# Patient Record
Sex: Male | Born: 1955
Health system: Southern US, Community
[De-identification: ages and names within clinical notes are randomized; demographics above are authoritative.]

## PROBLEM LIST (undated history)

## (undated) DIAGNOSIS — E079 Disorder of thyroid, unspecified: Secondary | ICD-10-CM

## (undated) DIAGNOSIS — I10 Essential (primary) hypertension: Secondary | ICD-10-CM

## (undated) HISTORY — DX: Essential (primary) hypertension: I10

## (undated) HISTORY — PX: HERNIA REPAIR: SHX51

## (undated) HISTORY — DX: Disorder of thyroid, unspecified: E07.9

---

## 2010-11-09 ENCOUNTER — Emergency Department (HOSPITAL_COMMUNITY)
Admission: EM | Admit: 2010-11-09 | Discharge: 2010-11-09 | Disposition: A | Discharge status: Home / Self Care | Payer: Federal, State, Local not specified - PPO | Attending: Emergency Medicine | Admitting: Emergency Medicine

## 2010-11-09 ENCOUNTER — Emergency Department (HOSPITAL_COMMUNITY): Payer: Federal, State, Local not specified - PPO

## 2010-11-09 DIAGNOSIS — Y9289 Other specified places as the place of occurrence of the external cause: Secondary | ICD-10-CM | POA: Insufficient documentation

## 2010-11-09 DIAGNOSIS — I4510 Unspecified right bundle-branch block: Secondary | ICD-10-CM | POA: Insufficient documentation

## 2010-11-09 DIAGNOSIS — R072 Precordial pain: Secondary | ICD-10-CM | POA: Insufficient documentation

## 2010-11-09 DIAGNOSIS — I498 Other specified cardiac arrhythmias: Secondary | ICD-10-CM | POA: Insufficient documentation

## 2010-11-09 DIAGNOSIS — Y990 Civilian activity done for income or pay: Secondary | ICD-10-CM | POA: Insufficient documentation

## 2010-11-09 LAB — COMPREHENSIVE METABOLIC PANEL
ALT: 18 U/L (ref 0–53)
AST: 18 U/L (ref 0–37)
Albumin: 4 g/dL (ref 3.5–5.2)
Alkaline Phosphatase: 82 U/L (ref 39–117)
CO2: 22 mEq/L (ref 19–32)
Chloride: 106 mEq/L (ref 96–112)
Creatinine, Ser: 0.83 mg/dL (ref 0.50–1.35)
Potassium: 3.9 mEq/L (ref 3.5–5.1)
Sodium: 137 mEq/L (ref 135–145)
Total Bilirubin: 0.2 mg/dL — ABNORMAL LOW (ref 0.3–1.2)

## 2010-11-09 LAB — DIFFERENTIAL
Lymphocytes Relative: 16 % (ref 12–46)
Lymphs Abs: 1.9 10*3/uL (ref 0.7–4.0)
Monocytes Relative: 7 % (ref 3–12)
Neutrophils Relative %: 74 % (ref 43–77)

## 2010-11-09 LAB — URINALYSIS, ROUTINE W REFLEX MICROSCOPIC
Bilirubin Urine: NEGATIVE
Glucose, UA: NEGATIVE mg/dL
Hgb urine dipstick: NEGATIVE
Protein, ur: NEGATIVE mg/dL

## 2010-11-09 LAB — CBC
HCT: 44.6 % (ref 39.0–52.0)
Hemoglobin: 16.1 g/dL (ref 13.0–17.0)
MCH: 32.4 pg (ref 26.0–34.0)
MCV: 89.7 fL (ref 78.0–100.0)
RBC: 4.97 MIL/uL (ref 4.22–5.81)
WBC: 11.7 10*3/uL — ABNORMAL HIGH (ref 4.0–10.5)

## 2010-11-09 LAB — TROPONIN I: Troponin I: <0.3 ng/mL (ref <0.30)

## 2010-11-09 LAB — CK TOTAL AND CKMB (NOT AT ARMC): Relative Index: 3 — ABNORMAL HIGH (ref 0.0–2.5)

## 2011-06-14 ENCOUNTER — Ambulatory Visit (INDEPENDENT_AMBULATORY_CARE_PROVIDER_SITE_OTHER): Payer: Federal, State, Local not specified - PPO | Admitting: Internal Medicine

## 2011-06-14 VITALS — BP 137/95 | HR 58 | Temp 98.1°F | Resp 18 | Ht 71.0 in | Wt 241.0 lb

## 2011-06-14 DIAGNOSIS — R112 Nausea with vomiting, unspecified: Secondary | ICD-10-CM

## 2011-06-14 DIAGNOSIS — R197 Diarrhea, unspecified: Secondary | ICD-10-CM

## 2011-06-14 LAB — POCT UA - MICROSCOPIC ONLY
Crystals, Ur, HPF, POC: NEGATIVE
Epithelial cells, urine per micros: NEGATIVE
Mucus, UA: NEGATIVE
RBC, urine, microscopic: NEGATIVE
Yeast, UA: NEGATIVE

## 2011-06-14 LAB — POCT URINALYSIS DIPSTICK
Ketones, UA: 15
pH, UA: 5.5

## 2011-06-14 LAB — POCT CBC
Granulocyte percent: 63.7 %G (ref 37–80)
MID (cbc): 0.8 (ref 0–0.9)
MPV: 9.5 fL (ref 0–99.8)
POC Granulocyte: 5.8 (ref 2–6.9)
POC LYMPH PERCENT: 27 %L (ref 10–50)
Platelet Count, POC: 260 10*3/uL (ref 142–424)
RDW, POC: 13.7 %

## 2011-06-14 LAB — COMPREHENSIVE METABOLIC PANEL
ALT: 41 U/L (ref 0–53)
AST: 41 U/L — ABNORMAL HIGH (ref 0–37)
Alkaline Phosphatase: 69 U/L (ref 39–117)
Sodium: 136 mEq/L (ref 135–145)
Total Bilirubin: 0.7 mg/dL (ref 0.3–1.2)
Total Protein: 7.6 g/dL (ref 6.0–8.3)

## 2011-06-14 LAB — GLUCOSE, POCT (MANUAL RESULT ENTRY): POC Glucose: 87

## 2011-06-14 MED ORDER — ONDANSETRON HCL 4 MG PO TABS: 4.0000 mg | ORAL_TABLET | Freq: Three times a day (TID) | ORAL | Status: AC | PRN

## 2011-06-14 NOTE — Patient Instructions (Signed)
Nausea and Diarrhea.  Take your Zofran and Imodium for 24-48 hours.  Drink clear liquids today then advance to Chicken soup tonight for supper.  Avoid eating out as additives might upset your bowels more.  Tomorrow try bananas, rice, toast, saltines.  Add more foods to your diet as your symptoms subside. Diarrhea Infections caused by germs (bacterial) or a virus commonly cause diarrhea. Your caregiver has determined that with time, rest and fluids, the diarrhea should improve. In general, eat normally while drinking more water than usual. Although water may prevent dehydration, it does not contain salt and minerals (electrolytes). Broths, weak tea without caffeine and oral rehydration solutions (ORS) replace fluids and electrolytes. Small amounts of fluids should be taken frequently. Large amounts at one time may not be tolerated. Plain water may be harmful in infants and the elderly. Oral rehydrating solutions (ORS) are available at pharmacies and grocery stores. ORS replace water and important electrolytes in proper proportions. Sports drinks are not as effective as ORS and may be harmful due to sugars worsening diarrhea.  ORS is especially recommended for use in children with diarrhea. As a general guideline for children, replace any new fluid losses from diarrhea and/or vomiting with ORS as follows:   If your child weighs 22 pounds or under (10 kg or less), give 60-120 mL ( -  cup or 2 - 4 ounces) of ORS for each episode of diarrheal stool or vomiting episode.   If your child weighs more than 22 pounds (more than 10 kgs), give 120-240 mL ( - 1 cup or 4 - 8 ounces) of ORS for each diarrheal stool or episode of vomiting.   While correcting for dehydration, children should eat normally. However, foods high in sugar should be avoided because this may worsen diarrhea. Large amounts of carbonated soft drinks, juice, gelatin desserts and other highly sugared drinks should be avoided.   After correction  of dehydration, other liquids that are appealing to the child may be added. Children should drink small amounts of fluids frequently and fluids should be increased as tolerated. Children should drink enough fluids to keep urine clear or pale yellow.   Adults should eat normally while drinking more fluids than usual. Drink small amounts of fluids frequently and increase as tolerated. Drink enough fluids to keep urine clear or pale yellow. Broths, weak decaffeinated tea, lemon lime soft drinks (allowed to go flat) and ORS replace fluids and electrolytes.   Avoid:   Carbonated drinks.   Juice.   Extremely hot or cold fluids.   Caffeine drinks.   Fatty, greasy foods.   Alcohol.   Tobacco.   Too much intake of anything at one time.   Gelatin desserts.   Probiotics are active cultures of beneficial bacteria. They may lessen the amount and number of diarrheal stools in adults. Probiotics can be found in yogurt with active cultures and in supplements.   Wash hands well to avoid spreading bacteria and virus.   Anti-diarrheal medications are not recommended for infants and children.   Only take over-the-counter or prescription medicines for pain, discomfort or fever as directed by your caregiver. Do not give aspirin to children because it may cause Reye's Syndrome.   For adults, ask your caregiver if you should continue all prescribed and over-the-counter medicines.   If your caregiver has given you a follow-up appointment, it is very important to keep that appointment. Not keeping the appointment could result in a chronic or permanent injury, and disability. If  there is any problem keeping the appointment, you must call back to this facility for assistance.  SEEK IMMEDIATE MEDICAL CARE IF:   You or your child is unable to keep fluids down or other symptoms or problems become worse in spite of treatment.   Vomiting or diarrhea develops and becomes persistent.   There is vomiting of  blood or bile (green material).   There is blood in the stool or the stools are black and tarry.   There is no urine output in 6-8 hours or there is only a small amount of very dark urine.   Abdominal pain develops, increases or localizes.   You have a fever.   Your baby is older than 3 months with a rectal temperature of 102 F (38.9 C) or higher.   Your baby is 3 months old or younger with a rectal temperature of 100.4 F (38 C) or higher.   You or your child develops excessive weakness, dizziness, fainting or extreme thirst.   You or your child develops a rash, stiff neck, severe headache or become irritable or sleepy and difficult to awaken.  MAKE SURE YOU:   Understand these instructions.   Will watch your condition.   Will get help right away if you are not doing well or get worse.  Document Released: 03/23/2002 Document Revised: 12/13/2010 Document Reviewed: 02/07/2009 [facility] Patient Information 2012 [place], [place].

## 2011-06-14 NOTE — Progress Notes (Signed)
  Subjective:    Patient ID: Fred Davis, male    DOB: July 30, 1955, 56 y.o.   MRN: [id number]  Emesis  This is a new problem. The current episode started in the past 7 days. The problem occurs 2 to 4 times per day. The problem has been resolved. The emesis has an appearance of stomach contents. There has been no fever. Associated symptoms include diarrhea.  Diarrhea  This is a new problem. The current episode started in the past 7 days. The problem occurs 5 to 10 times per day. The problem has been unchanged. The stool consistency is described as watery. The patient states that diarrhea does not awaken him from sleep. Associated symptoms include vomiting. Risk factors include no known risk factors. He has tried anti-motility drug for the symptoms. The treatment provided mild relief.  Fred Davis began having nausea and vomiting (2 episodes) Sunday evening and into Monday.  His vomiting then resolved but he now has very watery stools with all solid intake.  He has not seen blood in his stool or had abdominal pain though his stomach feels "rumbly".  He has had 2-4 stools yesterday and none today.  He is taking in water without difficulty or diarrhea.   He has never had a colonoscopy.  He smokes 5 cigarettes daily.  He complains of being hungry today.  He has been using Imodium for the past 2 days which has decreased his diarrhea.   He denies blood in the stool.  He denies any recent travel, unusual water sources or recent antibiotic use.  He is not lightheaded.    Review of Systems  Constitutional: Negative.   HENT: Negative.   Eyes: Negative.   Respiratory: Negative.   Cardiovascular: Negative.   Gastrointestinal: Positive for vomiting and diarrhea.  Genitourinary: Negative.   Musculoskeletal: Negative.   Skin: Negative.   Neurological: Negative.   Hematological: Negative.   Psychiatric/Behavioral: Negative.        Objective:   Physical Exam  Vitals reviewed. Constitutional: He is oriented  to person, place, and time. He appears well-developed and well-nourished.  Cardiovascular: Normal rate, regular rhythm and normal heart sounds.   Pulmonary/Chest: Effort normal and breath sounds normal.  Abdominal: Soft. Bowel sounds are normal. He exhibits no distension and no mass. There is no tenderness. There is no rebound and no guarding.       Open cholecystecomy scar noted.  Neurological: He is alert and oriented to person, place, and time.  Skin: Skin is warm and dry.  Psychiatric: He has a normal mood and affect. His behavior is normal.          Assessment & Plan:  Nausea and Diarrhea:  Likely viral enteritis given his CBC WNL, normal glucose.  U/A indicates moderate dehydration with S.G. 1.030.  We will prescribe Zofran for his nause and have him continue Imodium for another 24-48 hours.  He will go home and stick with clear fluids today, advancing his diet as per pt instruction.  RTC if symptoms persist or worsen.  He will be referred for a screening colonoscopy.

## 2012-01-31 ENCOUNTER — Ambulatory Visit: Payer: Worker's Compensation

## 2012-01-31 ENCOUNTER — Other Ambulatory Visit: Payer: Self-pay | Admitting: Nephrology

## 2012-01-31 DIAGNOSIS — R05 Cough: Secondary | ICD-10-CM

## 2012-01-31 DIAGNOSIS — R059 Cough, unspecified: Secondary | ICD-10-CM

## 2012-10-06 ENCOUNTER — Encounter: Payer: Self-pay | Admitting: Radiology

## 2012-10-06 ENCOUNTER — Ambulatory Visit (INDEPENDENT_AMBULATORY_CARE_PROVIDER_SITE_OTHER): Payer: Federal, State, Local not specified - PPO | Admitting: Emergency Medicine

## 2012-10-06 VITALS — BP 120/82 | HR 64 | Temp 97.9°F | Resp 18 | Ht 71.0 in | Wt 249.0 lb

## 2012-10-06 DIAGNOSIS — K5900 Constipation, unspecified: Secondary | ICD-10-CM

## 2012-10-06 DIAGNOSIS — G4700 Insomnia, unspecified: Secondary | ICD-10-CM

## 2012-10-06 DIAGNOSIS — J018 Other acute sinusitis: Secondary | ICD-10-CM

## 2012-10-06 MED ORDER — POLYETHYLENE GLYCOL 3350 17 GM/SCOOP PO POWD: 17.0000 g | Freq: Two times a day (BID) | ORAL | Status: DC | PRN

## 2012-10-06 MED ORDER — AMOXICILLIN-POT CLAVULANATE 875-125 MG PO TABS: 1.0000 | ORAL_TABLET | Freq: Two times a day (BID) | ORAL | Status: DC

## 2012-10-06 MED ORDER — TEMAZEPAM 30 MG PO CAPS: 30.0000 mg | ORAL_CAPSULE | Freq: Every evening | ORAL | Status: DC | PRN

## 2012-10-06 MED ORDER — CHLORPHENIRAMINE-ACETAMINOPHEN 2-325 MG PO TABS: 1.0000 | ORAL_TABLET | Freq: Four times a day (QID) | ORAL | Status: DC

## 2012-10-06 NOTE — Patient Instructions (Signed)
Sinusitis Sinusitis is redness, soreness, and swelling (inflammation) of the paranasal sinuses. Paranasal sinuses are air pockets within the bones of your face (beneath the eyes, the middle of the forehead, or above the eyes). In healthy paranasal sinuses, mucus is able to drain out, and air is able to circulate through them by way of your nose. However, when your paranasal sinuses are inflamed, mucus and air can become trapped. This can allow bacteria and other germs to grow and cause infection. Sinusitis can develop quickly and last only a short time (acute) or continue over a long period (chronic). Sinusitis that lasts for more than 12 weeks is considered chronic.  CAUSES  Causes of sinusitis include: Allergies. Structural abnormalities, such as displacement of the cartilage that separates your nostrils (deviated septum), which can decrease the air flow through your nose and sinuses and affect sinus drainage. Functional abnormalities, such as when the small hairs (cilia) that line your sinuses and help remove mucus do not work properly or are not present. SYMPTOMS  Symptoms of acute and chronic sinusitis are the same. The primary symptoms are pain and pressure around the affected sinuses. Other symptoms include: Upper toothache. Earache. Headache. Bad breath. Decreased sense of smell and taste. A cough, which worsens when you are lying flat. Fatigue. Fever. Thick drainage from your nose, which often is green and may contain pus (purulent). Swelling and warmth over the affected sinuses. DIAGNOSIS  Your caregiver will perform a physical exam. During the exam, your caregiver may: Look in your nose for signs of abnormal growths in your nostrils (nasal polyps). Tap over the affected sinus to check for signs of infection. View the inside of your sinuses (endoscopy) with a special imaging device with a light attached (endoscope), which is inserted into your sinuses. If your caregiver suspects  that you have chronic sinusitis, one or more of the following tests may be recommended: Allergy tests. Nasal culture A sample of mucus is taken from your nose and sent to a lab and screened for bacteria. Nasal cytology A sample of mucus is taken from your nose and examined by your caregiver to determine if your sinusitis is related to an allergy. TREATMENT  Most cases of acute sinusitis are related to a viral infection and will resolve on their own within 10 days. Sometimes medicines are prescribed to help relieve symptoms (pain medicine, decongestants, nasal steroid sprays, or saline sprays).  However, for sinusitis related to a bacterial infection, your caregiver will prescribe antibiotic medicines. These are medicines that will help kill the bacteria causing the infection.  Rarely, sinusitis is caused by a fungal infection. In theses cases, your caregiver will prescribe antifungal medicine. For some cases of chronic sinusitis, surgery is needed. Generally, these are cases in which sinusitis recurs more than 3 times per year, despite other treatments. HOME CARE INSTRUCTIONS  Drink plenty of water. Water helps thin the mucus so your sinuses can drain more easily. Use a humidifier. Inhale steam 3 to 4 times a day (for example, sit in the bathroom with the shower running). Apply a warm, moist washcloth to your face 3 to 4 times a day, or as directed by your caregiver. Use saline nasal sprays to help moisten and clean your sinuses. Take over-the-counter or prescription medicines for pain, discomfort, or fever only as directed by your caregiver. SEEK IMMEDIATE MEDICAL CARE IF: You have increasing pain or severe headaches. You have nausea, vomiting, or drowsiness. You have swelling around your face. You have vision problems.  You have a stiff neck. You have difficulty breathing. MAKE SURE YOU:  Understand these instructions. Will watch your condition. Will get help right away if you are not  doing well or get worse. Document Released: 04/02/2005 Document Revised: 06/25/2011 Document Reviewed: 04/17/2011 [facility] Patient Information 2014 [place], [place]. Insomnia Insomnia is frequent trouble falling and/or staying asleep. Insomnia can be a long term problem or a short term problem. Both are common. Insomnia can be a short term problem when the wakefulness is related to a certain stress or worry. Long term insomnia is often related to ongoing stress during waking hours and/or poor sleeping habits. Overtime, sleep deprivation itself can make the problem worse. Every little thing feels more severe because you are overtired and your ability to cope is decreased. CAUSES   Stress, anxiety, and depression.  Poor sleeping habits.  Distractions such as TV in the bedroom.  Naps close to bedtime.  Engaging in emotionally charged conversations before bed.  Technical reading before sleep.  Alcohol and other sedatives. They may make the problem worse. They can hurt normal sleep patterns and normal dream activity.  Stimulants such as caffeine for several hours prior to bedtime.  Pain syndromes and shortness of breath can cause insomnia.  Exercise late at night.  Changing time zones may cause sleeping problems (jet lag). It is sometimes helpful to have someone observe your sleeping patterns. They should look for periods of not breathing during the night (sleep apnea). They should also look to see how long those periods last. If you live alone or observers are uncertain, you can also be observed at a sleep clinic where your sleep patterns will be professionally monitored. Sleep apnea requires a checkup and treatment. Give your caregivers your medical history. Give your caregivers observations your family has made about your sleep.  SYMPTOMS   Not feeling rested in the morning.  Anxiety and restlessness at bedtime.  Difficulty falling and staying asleep. TREATMENT   Your caregiver may  prescribe treatment for an underlying medical disorders. Your caregiver can give advice or help if you are using alcohol or other drugs for self-medication. Treatment of underlying problems will usually eliminate insomnia problems.  Medications can be prescribed for short time use. They are generally not recommended for lengthy use.  Over-the-counter sleep medicines are not recommended for lengthy use. They can be habit forming.  You can promote easier sleeping by making lifestyle changes such as:  Using relaxation techniques that help with breathing and reduce muscle tension.  Exercising earlier in the day.  Changing your diet and the time of your last meal. No night time snacks.  Establish a regular time to go to bed.  Counseling can help with stressful problems and worry.  Soothing music and white noise may be helpful if there are background noises you cannot remove.  Stop tedious detailed work at least one hour before bedtime. HOME CARE INSTRUCTIONS   Keep a diary. Inform your caregiver about your progress. This includes any medication side effects. See your caregiver regularly. Take note of:  Times when you are asleep.  Times when you are awake during the night.  The quality of your sleep.  How you feel the next day. This information will help your caregiver care for you.  Get out of bed if you are still awake after 15 minutes. Read or do some quiet activity. Keep the lights down. Wait until you feel sleepy and go back to bed.  Keep regular sleeping and waking  hours. Avoid naps.  Exercise regularly.  Avoid distractions at bedtime. Distractions include watching television or engaging in any intense or detailed activity like attempting to balance the household checkbook.  Develop a bedtime ritual. Keep a familiar routine of bathing, brushing your teeth, climbing into bed at the same time each night, listening to soothing music. Routines increase the success of falling to  sleep faster.  Use relaxation techniques. This can be using breathing and muscle tension release routines. It can also include visualizing peaceful scenes. You can also help control troubling or intruding thoughts by keeping your mind occupied with boring or repetitive thoughts like the old concept of counting sheep. You can make it more creative like imagining planting one beautiful flower after another in your backyard garden.  During your day, work to eliminate stress. When this is not possible use some of the previous suggestions to help reduce the anxiety that accompanies stressful situations. MAKE SURE YOU:   Understand these instructions.  Will watch your condition.  Will get help right away if you are not doing well or get worse. Document Released: 03/30/2000 Document Revised: 06/25/2011 Document Reviewed: 04/30/2007 [facility] Patient Information 2014 [place], [place]. Constipation, Adult Constipation is when a person has fewer than 3 bowel movements a week; has difficulty having a bowel movement; or has stools that are dry, hard, or larger than normal. As people grow older, constipation is more common. If you try to fix constipation with medicines that make you have a bowel movement (laxatives), the problem may get worse. Long-term laxative use may cause the muscles of the colon to become weak. A low-fiber diet, not taking in enough fluids, and taking certain medicines may make constipation worse. CAUSES   Certain medicines, such as antidepressants, pain medicine, iron supplements, antacids, and water pills.   Certain diseases, such as diabetes, irritable bowel syndrome (IBS), thyroid disease, or depression.   Not drinking enough water.   Not eating enough fiber-rich foods.   Stress or travel.  Lack of physical activity or exercise.  Not going to the restroom when there is the urge to have a bowel movement.  Ignoring the urge to have a bowel movement.  Using laxatives too  much. SYMPTOMS   Having fewer than 3 bowel movements a week.   Straining to have a bowel movement.   Having hard, dry, or larger than normal stools.   Feeling full or bloated.   Pain in the lower abdomen.  Not feeling relief after having a bowel movement. DIAGNOSIS  Your caregiver will take a medical history and perform a physical exam. Further testing may be done for severe constipation. Some tests may include:   A barium enema X-ray to examine your rectum, colon, and sometimes, your small intestine.  A sigmoidoscopy to examine your lower colon.  A colonoscopy to examine your entire colon. TREATMENT  Treatment will depend on the severity of your constipation and what is causing it. Some dietary treatments include drinking more fluids and eating more fiber-rich foods. Lifestyle treatments may include regular exercise. If these diet and lifestyle recommendations do not help, your caregiver may recommend taking over-the-counter laxative medicines to help you have bowel movements. Prescription medicines may be prescribed if over-the-counter medicines do not work.  HOME CARE INSTRUCTIONS   Increase dietary fiber in your diet, such as fruits, vegetables, whole grains, and beans. Limit high-fat and processed sugars in your diet, such as [place] fries, hamburgers, cookies, candies, and soda.   A fiber supplement may  be added to your diet if you cannot get enough fiber from foods.   Drink enough fluids to keep your urine clear or pale yellow.   Exercise regularly or as directed by your caregiver.   Go to the restroom when you have the urge to go. Do not hold it.  Only take medicines as directed by your caregiver. Do not take other medicines for constipation without talking to your caregiver first. SEEK IMMEDIATE MEDICAL CARE IF:   You have bright red blood in your stool.   Your constipation lasts for more than 4 days or gets worse.   You have abdominal or rectal pain.    You have thin, pencil-like stools.  You have unexplained weight loss. MAKE SURE YOU:   Understand these instructions.  Will watch your condition.  Will get help right away if you are not doing well or get worse. Document Released: 12/30/2003 Document Revised: 06/25/2011 Document Reviewed: 03/06/2011 [facility] Patient Information 2014 [place], [place].

## 2012-10-06 NOTE — Progress Notes (Signed)
[facility] [address], [place] [address] [phone]  Date:  10/06/2012   Name:  Pavel Gadd   DOB:  [birth year]   MRN:  [id number]  PCP:  No primary provider on file.    Chief Complaint: sneezing, sore throat, stomach pain and issues sleeping   History of Present Illness:  Fred Davis is a 57 y.o. very pleasant male patient who presents with the following:  Last week had green nasal drainage and post nasal drainage.  Has sore throat.  Cough productive scant purulent mucous.  No wheezing or shortness of breath. No fever or chills.  Now has a headache.  Says he slept little over the weekend. Saturday developed abdominal discomfort and has been constipated.  No nausea or vomiting.  Feels bloated.  Eating and drinking normally.  No food intolerance.  No improvement with over the counter medications or other home remedies. Denies other complaint or health concern today.   There are no active problems to display for this patient.   History reviewed. No pertinent past medical history.  Past Surgical History  Procedure Laterality Date  . Hernia repair      History  Substance Use Topics  . Smoking status: Current Every Day Smoker  . Smokeless tobacco: Not on file  . Alcohol Use: Not on file    Family History  Problem Relation Age of Onset  . Multiple sclerosis Mother   . Stroke Father     No Known Allergies  Medication list has been reviewed and updated.  Current Outpatient Prescriptions on File Prior to Visit  Medication Sig Dispense Refill  . aspirin 325 MG tablet Take 325 mg by mouth daily.      . diphenhydramine-acetaminophen (TYLENOL PM) 25-500 MG TABS Take 1 tablet by mouth at bedtime as needed.      . meloxicam (MOBIC) 7.5 MG tablet Take 7.5 mg by mouth as needed.       No current facility-administered medications on file prior to visit.    Review of Systems:  As per HPI, otherwise negative.    Physical Examination: Filed  Vitals:   10/06/12 1416  BP: 120/82  Pulse: 64  Temp: 97.9 F (36.6 C)  Resp: 18   Filed Vitals:   10/06/12 1416  Height: 5\' 11"  (1.803 m)  Weight: 249 lb (112.946 kg)   Body mass index is 34.74 kg/(m^2). Ideal Body Weight: Weight in (lb) to have BMI = 25: 178.9  GEN: WDWN, NAD, Non-toxic, A & O x 3 HEENT: Atraumatic, Normocephalic. Neck supple. No masses, No LAD. Ears and Nose: No external deformity. CV: RRR, No M/G/R. No JVD. No thrill. No extra heart sounds. PULM: CTA B, no wheezes, crackles, rhonchi. No retractions. No resp. distress. No accessory muscle use. ABD: S, NT, ND, +BS. No rebound. No HSM. EXTR: No c/c/e NEURO Normal gait.  PSYCH: Normally interactive. Conversant. Not depressed or anxious appearing.  Calm demeanor.    Assessment and Plan: Sinusitis augmentin Coricidin HBP Insomnia Follow up with DR Cleta Alberts   Signed,  Phillips Odor, MD

## 2013-06-10 ENCOUNTER — Ambulatory Visit (INDEPENDENT_AMBULATORY_CARE_PROVIDER_SITE_OTHER): Payer: Federal, State, Local not specified - PPO | Admitting: Emergency Medicine

## 2013-06-10 ENCOUNTER — Ambulatory Visit: Payer: Federal, State, Local not specified - PPO

## 2013-06-10 VITALS — BP 146/104 | HR 54 | Temp 97.4°F | Resp 16 | Ht 70.0 in | Wt 258.0 lb

## 2013-06-10 DIAGNOSIS — M25511 Pain in right shoulder: Secondary | ICD-10-CM

## 2013-06-10 DIAGNOSIS — M5030 Other cervical disc degeneration, unspecified cervical region: Secondary | ICD-10-CM

## 2013-06-10 DIAGNOSIS — M25519 Pain in unspecified shoulder: Secondary | ICD-10-CM

## 2013-06-10 MED ORDER — TEMAZEPAM 30 MG PO CAPS: 30.0000 mg | ORAL_CAPSULE | Freq: Every evening | ORAL | Status: DC | PRN

## 2013-06-10 MED ORDER — PREDNISONE 10 MG PO TABS: ORAL_TABLET | ORAL | Status: DC

## 2013-06-10 NOTE — Progress Notes (Signed)
Subjective:    Patient ID: Fred Davis, male    DOB: 21-Apr-1955, 58 y.o.   MRN: [id number] This chart was scribed for Collene Gobble, MD by Valera Castle, ED Scribe. This patient was seen in room 01 and the patient's care was started at 10:54 AM.  Chief Complaint  Patient presents with  . Insomnia    on going  . Shoulder Pain    RIGHT    HPI Fred Davis is a 58 y.o. male Pt presents for insomnia and right shoulder pain.  He reports only having 5 hours of sleep in the last 2 days. He reports his job is demanding and feels fatigued stressed out from work, cannot manage with such little sleep. He states his supervisors/managers are relentless at work. He reports constant, right shoulder pain, for quite some time. He reports trouble sleeping due to pain. He states he has other contributors to his lack of sleep, ie stress. He reports sudden thobbing jolt of pain even when resting his shoulder in comfortable position. He noticed associated weakness in his right arm, states he almost dropped his cup of coffee this morning after an episode of pain. He states his shoulder pain has been ongoing for 7 years, but that his recent episodes of pain have been inconsistent with his h/o shoulder pain. He reports falling recently on ice. He denies any obvious injury at that time.   PCP - No primary provider on file.  There are no active problems to display for this patient.  No past medical history on file. Past Surgical History  Procedure Laterality Date  . Hernia repair     No Known Allergies Prior to Admission medications   Medication Sig Start Date End Date Taking? Authorizing Provider  diphenhydramine-acetaminophen (TYLENOL PM) 25-500 MG TABS Take 1 tablet by mouth at bedtime as needed.   Yes Historical Provider, MD  temazepam (RESTORIL) 30 MG capsule Take 1 capsule (30 mg total) by mouth at bedtime as needed for sleep. 10/06/12  Yes Phillips Odor, MD  amoxicillin-clavulanate (AUGMENTIN)  875-125 MG per tablet Take 1 tablet by mouth 2 (two) times daily. 10/06/12   Phillips Odor, MD  aspirin 325 MG tablet Take 325 mg by mouth daily.    Historical Provider, MD  Chlorpheniramine-APAP 2-325 MG TABS Take 1 tablet by mouth 4 (four) times daily. 10/06/12   Phillips Odor, MD  meloxicam (MOBIC) 7.5 MG tablet Take 7.5 mg by mouth as needed.    Historical Provider, MD  polyethylene glycol powder (GLYCOLAX/MIRALAX) powder Take 17 g by mouth 2 (two) times daily as needed. 10/06/12   Phillips Odor, MD   Review of Systems  Constitutional: Negative for fever.  Musculoskeletal: Positive for arthralgias (right shoulder, arm).  Psychiatric/Behavioral: Positive for sleep disturbance.      Objective:   Physical Exam  CONSTITUTIONAL: Well developed/well nourished HEAD: Normocephalic/atraumatic EYES: EOMI/PERRL ENMT: Mucous membranes moist NECK: supple no meningeal signs SPINE: entire spine nontender CV: Nl rate.  LUNGS: No respiratory distress. ABDOMEN: GU: NEURO: Pt is awake/alert, moves all extremitiesx4. Reflexes normal. No rotator cuff weakness in right arm.  EXTREMITIES: pulses normal, full ROM  SKIN: warm, color normal PSYCH: no abnormalities of mood noted  BP 146/104  Pulse 54  Temp(Src) 97.4 F (36.3 C) (Oral)  Resp 16  Ht 5\' 10"  (1.778 m)  Wt 258 lb (117.028 kg)  BMI 37.02 kg/m2  SpO2 96%  [facility] reading (PRIMARY) by Dr. Cleta Alberts: There is C5-C6 degenerative changes and C6-C7  degenerative disc disease shoulder films internal     Assessment & Plan:  I suspect his neck pain is coming from degenerative disc disease with radiculopathy down the right arm rather than primary shoulder disease. His rotator cuff testing was normal. We'll treat with prednisone refilled his Restoril for night we'll give him a note to be out of work until Monday recheck next week and decide on the need for an MRI .  **Disclaimer: This note was dictated with voice recognition software. Similar  sounding words can inadvertently be transcribed and this note may contain transcription errors which may not have been corrected upon publication of note.**   I personally performed the services described in this documentation, which was scribed in my presence. The recorded information has been reviewed and is accurate.

## 2013-06-10 NOTE — Patient Instructions (Signed)
Cervical Radiculopathy  Cervical radiculopathy happens when a nerve in the neck is pinched or bruised by a slipped (herniated) disk or by arthritic changes in the bones of the cervical spine. This can occur due to an injury or as part of the normal aging process. Pressure on the cervical nerves can cause pain or numbness that runs from your neck all the way down into your arm and fingers.  CAUSES   There are many possible causes, including:  · Injury.  · Muscle tightness in the neck from overuse.  · Swollen, painful joints (arthritis).  · Breakdown or degeneration in the bones and joints of the spine (spondylosis) due to aging.  · Bone spurs that may develop near the cervical nerves.  SYMPTOMS   Symptoms include pain, weakness, or numbness in the affected arm and hand. Pain can be severe or irritating. Symptoms may be worse when extending or turning the neck.  DIAGNOSIS   Your caregiver will ask about your symptoms and do a physical exam. He or she may test your strength and reflexes. X-rays, CT scans, and MRI scans may be needed in cases of injury or if the symptoms do not go away after a period of time. Electromyography (EMG) or nerve conduction testing may be done to study how your nerves and muscles are working.  TREATMENT   Your caregiver may recommend certain exercises to help relieve your symptoms. Cervical radiculopathy can, and often does, get better with time and treatment. If your problems continue, treatment options may include:  · Wearing a soft collar for short periods of time.  · Physical therapy to strengthen the neck muscles.  · Medicines, such as nonsteroidal anti-inflammatory drugs (NSAIDs), oral corticosteroids, or spinal injections.  · Surgery. Different types of surgery may be done depending on the cause of your problems.  HOME CARE INSTRUCTIONS   · Put ice on the affected area.  · Put ice in a plastic bag.  · Place a towel between your skin and the bag.  · Leave the ice on for 15-20 minutes,  03-04 times a day or as directed by your caregiver.  · If ice does not help, you can try using heat. Take a warm shower or bath, or use a hot water bottle as directed by your caregiver.  · You may try a gentle neck and shoulder massage.  · Use a flat pillow when you sleep.  · Only take over-the-counter or prescription medicines for pain, discomfort, or fever as directed by your caregiver.  · If physical therapy was prescribed, follow your caregiver's directions.  · If a soft collar was prescribed, use it as directed.  SEEK IMMEDIATE MEDICAL CARE IF:   · Your pain gets much worse and cannot be controlled with medicines.  · You have weakness or numbness in your hand, arm, face, or leg.  · You have a high fever or a stiff, rigid neck.  · You lose bowel or bladder control (incontinence).  · You have trouble with walking, balance, or speaking.  MAKE SURE YOU:   · Understand these instructions.  · Will watch your condition.  · Will get help right away if you are not doing well or get worse.  Document Released: 12/26/2000 Document Revised: 06/25/2011 Document Reviewed: 11/14/2010  ExitCare® Patient Information ©2014 [employer].

## 2013-06-12 ENCOUNTER — Telehealth: Payer: Self-pay

## 2013-06-12 NOTE — Telephone Encounter (Signed)
PA approved through 06/12/14. Notified pharm and pt on VM.

## 2013-06-12 NOTE — Telephone Encounter (Signed)
Pt called to report his temazepam needs PA. Completed on covermymeds.

## 2014-05-28 ENCOUNTER — Ambulatory Visit (INDEPENDENT_AMBULATORY_CARE_PROVIDER_SITE_OTHER): Payer: Federal, State, Local not specified - PPO | Admitting: Physician Assistant

## 2014-05-28 VITALS — BP 160/88 | HR 53 | Temp 98.0°F | Resp 18 | Ht 73.0 in | Wt 259.0 lb

## 2014-05-28 DIAGNOSIS — M25519 Pain in unspecified shoulder: Secondary | ICD-10-CM

## 2014-05-28 DIAGNOSIS — M542 Cervicalgia: Secondary | ICD-10-CM

## 2014-05-28 DIAGNOSIS — I10 Essential (primary) hypertension: Secondary | ICD-10-CM

## 2014-05-28 MED ORDER — MELOXICAM 15 MG PO TABS: 15.0000 mg | ORAL_TABLET | Freq: Every day | ORAL | Status: DC

## 2014-05-28 MED ORDER — CYCLOBENZAPRINE HCL 5 MG PO TABS: 5.0000 mg | ORAL_TABLET | Freq: Every evening | ORAL | Status: DC | PRN

## 2014-05-28 MED ORDER — HYDROCODONE-ACETAMINOPHEN 5-325 MG PO TABS: 1.0000 | ORAL_TABLET | Freq: Four times a day (QID) | ORAL | Status: DC | PRN

## 2014-05-28 MED ORDER — HYDROCHLOROTHIAZIDE 25 MG PO TABS: 25.0000 mg | ORAL_TABLET | Freq: Every day | ORAL | Status: DC

## 2014-05-28 NOTE — Progress Notes (Signed)
Subjective:    Patient ID: Fred Davis, male    DOB: 12/29/1955, 59 y.o.   MRN: [id number]  HPI Patient presents with 4 days of neck pain on the left side that is 7/10 pain and is dull and constant. Pain radiates to left shoulder. 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 No loss in ROM or function. No numbness, weakness, or loss of fxn in extremities. .  BP elevated in office. Has been on HCTZ for hypertension in the past when he was in the [employer]. Came off medication after dieting and exercising. Lost a considerable amount of weight. Denies SOB, CP, edema, or HA. Has gained weight over the past few years and is not surprised that BP is up.   Review of Systems  Constitutional: Negative for activity change.  Eyes: Negative for visual disturbance.  Respiratory: Negative for cough and shortness of breath.   Cardiovascular: Negative for chest pain, palpitations and leg swelling.  Musculoskeletal: Positive for myalgias and neck pain. Negative for back pain, joint swelling, arthralgias and neck stiffness.       Objective:   Physical Exam  Constitutional: He is oriented to person, place, and time. He appears well-developed and well-nourished. No distress.  Blood pressure 160/88, pulse 53, temperature 98 F (36.7 C), temperature source Oral, resp. rate 18, height 6\' 1"  (1.854 m), weight 259 lb (117.482 kg), SpO2 95 %. Initial BP 154/88.  HENT:  Head: Normocephalic and atraumatic.  Right Ear: External ear normal.  Left Ear: External ear normal.  Eyes: Conjunctivae and EOM are normal. Pupils are equal, round, and reactive to light. Right eye exhibits no discharge. Left eye exhibits no discharge. No scleral icterus.  Neck: Normal range of motion. Neck supple. No thyromegaly present.  Cardiovascular: Normal rate, regular rhythm and normal heart sounds.  Exam reveals no gallop and no friction  rub.   No murmur heard. Pulmonary/Chest: Effort normal and breath sounds normal. No respiratory distress. He has no wheezes. He has no rales.  Musculoskeletal: Normal range of motion. He exhibits no edema or tenderness.       Right shoulder: Normal.       Left shoulder: He exhibits pain. He exhibits normal range of motion, no tenderness, no bony tenderness, no swelling, no effusion, no crepitus, no deformity, no laceration, no spasm and normal strength.       Cervical back: He exhibits pain and spasm. He exhibits normal range of motion, no tenderness, no bony tenderness, no swelling, no edema and no deformity.       Thoracic back: Normal.       Back:  Lymphadenopathy:    He has no cervical adenopathy.  Neurological: He is alert and oriented to person, place, and time. He has normal strength and normal reflexes. No cranial nerve deficit or sensory deficit.  Skin: Skin is warm and dry. No rash noted. He is not diaphoretic. No erythema. No pallor.          Assessment & Plan:  1. Essential hypertension H/O hypertension that improved with diet and exercise. Since heavier than before not usual for BP to increase. Advised patient on med side effects. RTC in 4-6 weeks to see in BP improved and how he is doing on HCTZ. If not better will add lisinopril. - hydrochlorothiazide (HYDRODIURIL) 25 MG tablet; Take 1 tablet (25 mg total) by mouth daily.  Dispense: 30  tablet; Refill: 1  2. Neck and shoulder pain Put ice or cold pack on neck and shoulder. If not improved in 2 weeks will refer to physical therapy. Exercises given in the meantime.  - cyclobenzaprine (FLEXERIL) 5 MG tablet; Take 1 tablet (5 mg total) by mouth at bedtime as needed for muscle spasms.  Dispense: 15 tablet; Refill: 0 - meloxicam (MOBIC) 15 MG tablet; Take 1 tablet (15 mg total) by mouth daily.  Dispense: 30 tablet; Refill: 1 - HYDROcodone-acetaminophen (NORCO) 5-325 MG per tablet; Take 1 tablet by mouth every 6 (six) hours as  needed.  Dispense: 30 tablet; Refill: 0 - Note to return to work given for 05/30/14.   Janan Ridge PA-C  [facility] 05/28/2014 11:40 AM

## 2014-05-28 NOTE — Patient Instructions (Signed)

## 2014-07-13 ENCOUNTER — Ambulatory Visit (INDEPENDENT_AMBULATORY_CARE_PROVIDER_SITE_OTHER): Payer: Federal, State, Local not specified - PPO | Admitting: Emergency Medicine

## 2014-07-13 VITALS — BP 126/84 | HR 88 | Temp 98.0°F | Resp 18 | Ht 72.0 in | Wt 252.4 lb

## 2014-07-13 DIAGNOSIS — G4700 Insomnia, unspecified: Secondary | ICD-10-CM

## 2014-07-13 DIAGNOSIS — I10 Essential (primary) hypertension: Secondary | ICD-10-CM | POA: Diagnosis not present

## 2014-07-13 LAB — COMPREHENSIVE METABOLIC PANEL
ALT: 19 U/L (ref 0–53)
AST: 18 U/L (ref 0–37)
Albumin: 4 g/dL (ref 3.5–5.2)
Alkaline Phosphatase: 67 U/L (ref 39–117)
BILIRUBIN TOTAL: 0.3 mg/dL (ref 0.2–1.2)
BUN: 13 mg/dL (ref 6–23)
CALCIUM: 9.5 mg/dL (ref 8.4–10.5)
CHLORIDE: 103 meq/L (ref 96–112)
CO2: 22 meq/L (ref 19–32)
CREATININE: 0.86 mg/dL (ref 0.50–1.35)
Glucose, Bld: 104 mg/dL — ABNORMAL HIGH (ref 70–99)
Potassium: 4.3 mEq/L (ref 3.5–5.3)
Sodium: 135 mEq/L (ref 135–145)
Total Protein: 7 g/dL (ref 6.0–8.3)

## 2014-07-13 LAB — LIPID PANEL
CHOLESTEROL: 143 mg/dL (ref 0–200)
HDL: 40 mg/dL (ref >=40)
LDL CALC: 83 mg/dL (ref 0–99)
TRIGLYCERIDES: 100 mg/dL (ref <150)
Total CHOL/HDL Ratio: 3.6 Ratio
VLDL: 20 mg/dL (ref 0–40)

## 2014-07-13 LAB — CBC WITH DIFFERENTIAL/PLATELET
BASOS ABS: 0.1 10*3/uL (ref 0.0–0.1)
Basophils Relative: 1 % (ref 0–1)
Eosinophils Absolute: 0.3 10*3/uL (ref 0.0–0.7)
Eosinophils Relative: 3 % (ref 0–5)
HEMATOCRIT: 47.4 % (ref 39.0–52.0)
Hemoglobin: 16.6 g/dL (ref 13.0–17.0)
LYMPHS ABS: 2.2 10*3/uL (ref 0.7–4.0)
Lymphocytes Relative: 20 % (ref 12–46)
MCH: 31.7 pg (ref 26.0–34.0)
MCHC: 35 g/dL (ref 30.0–36.0)
MCV: 90.6 fL (ref 78.0–100.0)
MONO ABS: 1 10*3/uL (ref 0.1–1.0)
MPV: 10.2 fL (ref 8.6–12.4)
Monocytes Relative: 9 % (ref 3–12)
NEUTROS PCT: 67 % (ref 43–77)
Neutro Abs: 7.4 10*3/uL (ref 1.7–7.7)
PLATELETS: 281 10*3/uL (ref 150–400)
RBC: 5.23 MIL/uL (ref 4.22–5.81)
RDW: 13.3 % (ref 11.5–15.5)
WBC: 11.1 10*3/uL — AB (ref 4.0–10.5)

## 2014-07-13 MED ORDER — TEMAZEPAM 30 MG PO CAPS: 30.0000 mg | ORAL_CAPSULE | Freq: Every evening | ORAL | Status: DC | PRN

## 2014-07-13 MED ORDER — HYDROCHLOROTHIAZIDE 25 MG PO TABS: 25.0000 mg | ORAL_TABLET | Freq: Every day | ORAL | Status: DC

## 2014-07-13 NOTE — Addendum Note (Signed)
Addended by: Johnnette LitterARDWELL, Sabre Leonetti M on: 07/13/2014 10:21 AM   Modules accepted: Kipp BroodSmartSet

## 2014-07-13 NOTE — Addendum Note (Signed)
Addended by: Carmelina DaneANDERSON, Granville Whitefield S on: 07/13/2014 10:14 AM   Modules accepted: Orders

## 2014-07-13 NOTE — Patient Instructions (Signed)
Insomnia Insomnia Insomnia is frequent trouble falling and/or staying asleep. Insomnia can be a long term problem or a short term problem. Both are common. Insomnia can be a short term problem when the wakefulness is related to a certain stress or worry. Long term insomnia is often related to ongoing stress during waking hours and/or poor sleeping habits. Overtime, sleep deprivation itself can make the problem worse. Every little thing feels more severe because you are overtired and your ability to cope is decreased. CAUSES   Stress, anxiety, and depression.  Poor sleeping habits.  Distractions such as TV in the bedroom.  Naps close to bedtime.  Engaging in emotionally charged conversations before bed.  Technical reading before sleep.  Alcohol and other sedatives. They may make the problem worse. They can hurt normal sleep patterns and normal dream activity.  Stimulants such as caffeine for several hours prior to bedtime.  Pain syndromes and shortness of breath can cause insomnia.  Exercise late at night.  Changing time zones may cause sleeping problems (jet lag). It is sometimes helpful to have someone observe your sleeping patterns. They should look for periods of not breathing during the night (sleep apnea). They should also look to see how long those periods last. If you live alone or observers are uncertain, you can also be observed at a sleep clinic where your sleep patterns will be professionally monitored. Sleep apnea requires a checkup and treatment. Give your caregivers your medical history. Give your caregivers observations your family has made about your sleep.  SYMPTOMS   Not feeling rested in the morning.  Anxiety and restlessness at bedtime.  Difficulty falling and staying asleep. TREATMENT   Your caregiver may prescribe treatment for an underlying medical disorders. Your caregiver can give advice or help if you are using alcohol or other drugs for self-medication.  Treatment of underlying problems will usually eliminate insomnia problems.  Medications can be prescribed for short time use. They are generally not recommended for lengthy use.  Over-the-counter sleep medicines are not recommended for lengthy use. They can be habit forming.  You can promote easier sleeping by making lifestyle changes such as:  Using relaxation techniques that help with breathing and reduce muscle tension.  Exercising earlier in the day.  Changing your diet and the time of your last meal. No night time snacks.  Establish a regular time to go to bed.  Counseling can help with stressful problems and worry.  Soothing music and white noise may be helpful if there are background noises you cannot remove.  Stop tedious detailed work at least one hour before bedtime. HOME CARE INSTRUCTIONS   Keep a diary. Inform your caregiver about your progress. This includes any medication side effects. See your caregiver regularly. Take note of:  Times when you are asleep.  Times when you are awake during the night.  The quality of your sleep.  How you feel the next day. This information will help your caregiver care for you.  Get out of bed if you are still awake after 15 minutes. Read or do some quiet activity. Keep the lights down. Wait until you feel sleepy and go back to bed.  Keep regular sleeping and waking hours. Avoid naps.  Exercise regularly.  Avoid distractions at bedtime. Distractions include watching television or engaging in any intense or detailed activity like attempting to balance the household checkbook.  Develop a bedtime ritual. Keep a familiar routine of bathing, brushing your teeth, climbing into bed at the  same time each night, listening to soothing music. Routines increase the success of falling to sleep faster.  Use relaxation techniques. This can be using breathing and muscle tension release routines. It can also include visualizing peaceful scenes.  You can also help control troubling or intruding thoughts by keeping your mind occupied with boring or repetitive thoughts like the old concept of counting sheep. You can make it more creative like imagining planting one beautiful flower after another in your backyard garden.  During your day, work to eliminate stress. When this is not possible use some of the previous suggestions to help reduce the anxiety that accompanies stressful situations. MAKE SURE YOU:   Understand these instructions.  Will watch your condition.  Will get help right away if you are not doing well or get worse. Document Released: 03/30/2000 Document Revised: 06/25/2011 Document Reviewed: 04/30/2007 [facility] Patient Information 2015 [place], [place]. This information is not intended to replace advice given to you by your health care provider. Make sure you discuss any questions you have with your health care provider.

## 2014-07-13 NOTE — Progress Notes (Signed)
[facility] [address], [place] [address] [phone]  Date:  07/13/2014   Name:  Fred Davis Desautel   DOB:  03/19/1956   MRN:  [id number]  PCP:  No primary care provider on file.    Chief Complaint: Follow-up; Hypertension; and Medication Refill   History of Present Illness:  Fred Davis Leppert is a 59 y.o. very pleasant male patient who presents with the following:  Patient seen and put on antihypertensive. Now returns for refill and BP check No adverse effects of medication No target injury. Needs refill on restoril.  Takes sporadically No improvement with over the counter medications or other home remedies.  Denies other complaint or health concern today.   There are no active problems to display for this patient.   Past Medical History  Diagnosis Date  . Hypertension     Past Surgical History  Procedure Laterality Date  . Hernia repair      History  Substance Use Topics  . Smoking status: Current Every Day Smoker  . Smokeless tobacco: Not on file  . Alcohol Use: Not on file    Family History  Problem Relation Age of Onset  . Multiple sclerosis Mother   . Stroke Father     No Known Allergies  Medication list has been reviewed and updated.  Current Outpatient Prescriptions on File Prior to Visit  Medication Sig Dispense Refill  . aspirin 325 MG tablet Take 325 mg by mouth daily.    . Chlorpheniramine-APAP 2-325 MG TABS Take 1 tablet by mouth 4 (four) times daily. 80 each 0  . hydrochlorothiazide (HYDRODIURIL) 25 MG tablet Take 1 tablet (25 mg total) by mouth daily. 30 tablet 1  . meloxicam (MOBIC) 15 MG tablet Take 1 tablet (15 mg total) by mouth daily. 30 tablet 1  . temazepam (RESTORIL) 30 MG capsule Take 1 capsule (30 mg total) by mouth at bedtime as needed for sleep. 30 capsule 2  . cyclobenzaprine (FLEXERIL) 5 MG tablet Take 1 tablet (5 mg total) by mouth at bedtime as needed for muscle spasms. (Patient not taking: Reported on  07/13/2014) 15 tablet 0  . diphenhydramine-acetaminophen (TYLENOL PM) 25-500 MG TABS Take 1 tablet by mouth at bedtime as needed.    Marland Kitchen. HYDROcodone-acetaminophen (NORCO) 5-325 MG per tablet Take 1 tablet by mouth every 6 (six) hours as needed. (Patient not taking: Reported on 07/13/2014) 30 tablet 0   No current facility-administered medications on file prior to visit.    Review of Systems:  As per HPI, otherwise negative.    Physical Examination: Filed Vitals:   07/13/14 0905  BP: 126/84  Pulse: 88  Temp: 98 F (36.7 C)  Resp: 18   Filed Vitals:   07/13/14 0905  Height: 6' (1.829 m)  Weight: 252 lb 6.4 oz (114.488 kg)   Body mass index is 34.22 kg/(m^2). Ideal Body Weight: Weight in (lb) to have BMI = 25: 183.9   GEN: WDWN, NAD, Non-toxic, Alert & Oriented x 3 HEENT: Atraumatic, Normocephalic.  Ears and Nose: No external deformity. EXTR: No clubbing/cyanosis/edema NEURO: Normal gait.  PSYCH: Normally interactive. Conversant. Not depressed or anxious appearing.  Calm demeanor.    Assessment and Plan: Hypertension Insomnia Refills  Signed,  Phillips OdorJeffery Kshawn Canal, MD

## 2014-07-14 LAB — PSA: PSA: 0.46 ng/mL (ref <=4.00)

## 2014-10-07 ENCOUNTER — Telehealth: Payer: Self-pay

## 2014-10-07 NOTE — Telephone Encounter (Signed)
PA approved for temazepam 30 mg caps. Notified pharm.

## 2014-12-18 ENCOUNTER — Ambulatory Visit: Payer: Worker's Compensation

## 2014-12-18 ENCOUNTER — Ambulatory Visit (INDEPENDENT_AMBULATORY_CARE_PROVIDER_SITE_OTHER): Admitting: Emergency Medicine

## 2014-12-18 VITALS — BP 130/82 | HR 78 | Temp 98.2°F | Resp 18 | Ht 71.0 in | Wt 253.0 lb

## 2014-12-18 DIAGNOSIS — M25572 Pain in left ankle and joints of left foot: Secondary | ICD-10-CM | POA: Insufficient documentation

## 2014-12-18 DIAGNOSIS — M25472 Effusion, left ankle: Secondary | ICD-10-CM | POA: Insufficient documentation

## 2014-12-18 DIAGNOSIS — S99912A Unspecified injury of left ankle, initial encounter: Secondary | ICD-10-CM

## 2014-12-18 DIAGNOSIS — S93402A Sprain of unspecified ligament of left ankle, initial encounter: Secondary | ICD-10-CM | POA: Diagnosis not present

## 2014-12-18 MED ORDER — MELOXICAM 15 MG PO TABS: 7.5000 mg | ORAL_TABLET | Freq: Every day | ORAL | Status: DC

## 2014-12-18 NOTE — Patient Instructions (Signed)
RICE: Routine Care for Injuries The routine care of many injuries includes Rest, Ice, Compression, and Elevation (RICE). HOME CARE INSTRUCTIONS  Rest is needed to allow your body to heal. Routine activities can usually be resumed when comfortable. Injured tendons and bones can take up to 6 weeks to heal. Tendons are the cord-like structures that attach muscle to bone.  Ice following an injury helps keep the swelling down and reduces pain.  Put ice in a plastic bag.  Place a towel between your skin and the bag.  Leave the ice on for 15-20 minutes, 3-4 times a day, or as directed by your health care provider. Do this while awake, for the first 24 to 48 hours. After that, continue as directed by your caregiver.  Compression helps keep swelling down. It also gives support and helps with discomfort. If an elastic bandage has been applied, it should be removed and reapplied every 3 to 4 hours. It should not be applied tightly, but firmly enough to keep swelling down. Watch fingers or toes for swelling, bluish discoloration, coldness, numbness, or excessive pain. If any of these problems occur, remove the bandage and reapply loosely. Contact your caregiver if these problems continue.  Elevation helps reduce swelling and decreases pain. With extremities, such as the arms, hands, legs, and feet, the injured area should be placed near or above the level of the heart, if possible. SEEK IMMEDIATE MEDICAL CARE IF:  You have persistent pain and swelling.  You develop redness, numbness, or unexpected weakness.  Your symptoms are getting worse rather than improving after several days. These symptoms may indicate that further evaluation or further X-rays are needed. Sometimes, X-rays may not show a small broken bone (fracture) until 1 week or 10 days later. Make a follow-up appointment with your caregiver. Ask when your X-ray results will be ready. Make sure you get your X-ray results. Document Released:  07/15/2000 Document Revised: 04/07/2013 Document Reviewed: 09/01/2010 Exit[employer] Patient Information 2015 [employer]. This information is not intended to replace advice given to you by your health care provider. Make sure you discuss any questions you have with your health care provider.  

## 2014-12-18 NOTE — Progress Notes (Signed)
    MRN: [id number] DOB: [birth year]  Subjective:   Fred Davis is a 59 y.o. male presenting for chief complaint of Work Related Injury  Reports left ankle injury while at work. 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 While walking on incomplete asphalt, ~1-2 inch difference, patient rolled his left ankle, heard a pop. 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 He has since had increasing sharp pain, swelling, difficulty bearing weight on his left foot. He has not tried medications for relief. Denies bony deformity, numbness or tingling, loss of range of motion. Denies any other aggravating or relieving factors, no other questions or concerns.  Fraser's medications list, allergies, past medical history and past surgical history were reviewed and excluded from this note due to being a worker's comp case.  Objective:   Vitals: BP 130/82 mmHg  Pulse 78  Temp(Src) 98.2 F (36.8 C) (Oral)  Resp 18  Ht  (1.803 m)  Wt 253 lb (114.76 kg)  BMI 35.30 kg/m2  SpO2 97%  Physical Exam  Constitutional: He is oriented to person, place, and time. He appears well-developed and well-nourished.  Cardiovascular: Normal rate.   Pulmonary/Chest: Effort normal.  Musculoskeletal:       Left ankle: He exhibits decreased range of motion (inversion of foot) and swelling. He exhibits no ecchymosis, no deformity and no laceration. Tenderness. Lateral malleolus, AITFL and posterior TFL tenderness found. No medial malleolus, no head of 5th metatarsal and no proximal fibula tenderness found. Achilles tendon exhibits no pain and no defect.       Feet:  Neurological: He is alert and oriented to person, place, and time.  Skin: Skin is warm and dry. No rash noted. No erythema. No pallor.  Psychiatric: He has a normal mood and affect.   [facility] reading (PRIMARY) by  Dr. Cleta Alberts and PA-Jhayla Podgorski. Left ankle - negative for fracture.  Assessment and Plan :   1. Left  ankle sprain, initial encounter 2. Left ankle injury, initial encounter 3. Left ankle swelling 4. Left ankle pain - Stable, advised RICE, work restrictions provided. Start meloxicam for pain and inflammation. Follow up in 1 week.  Wallis Bamberg, PA-C [facility] [phone] 12/18/2014 3:15 PM

## 2014-12-25 ENCOUNTER — Ambulatory Visit (INDEPENDENT_AMBULATORY_CARE_PROVIDER_SITE_OTHER): Admitting: Family Medicine

## 2014-12-25 VITALS — BP 116/80 | HR 62 | Temp 97.4°F | Resp 18 | Ht 71.0 in | Wt 260.0 lb

## 2014-12-25 DIAGNOSIS — M25472 Effusion, left ankle: Secondary | ICD-10-CM

## 2014-12-25 DIAGNOSIS — S93402D Sprain of unspecified ligament of left ankle, subsequent encounter: Secondary | ICD-10-CM | POA: Diagnosis not present

## 2014-12-25 NOTE — Patient Instructions (Signed)
See the handout on ankle sprain including some early exercises you can try. As discussed if any increased pain with these - do not do them, and if you are not tolerating the work restrictions - return sooner than 1 week. If needed - we can repeat xray next week.   Wear brace, elevate leg at breaks or when you are seated, ice if needed at the end of the day.  Return to the clinic or go to the nearest emergency room if any of your symptoms worsen or new symptoms occur.   Acute Ankle Sprain with Phase I Rehab An acute ankle sprain is a partial or complete tear in one or more of the ligaments of the ankle due to traumatic injury. The severity of the injury depends on both the number of ligaments sprained and the grade of sprain. There are 3 grades of sprains.   A grade 1 sprain is a mild sprain. There is a slight pull without obvious tearing. There is no loss of strength, and the muscle and ligament are the correct length.  A grade 2 sprain is a moderate sprain. There is tearing of fibers within the substance of the ligament where it connects two bones or two cartilages. The length of the ligament is increased, and there is usually decreased strength.  A grade 3 sprain is a complete rupture of the ligament and is uncommon. In addition to the grade of sprain, there are three types of ankle sprains.  Lateral ankle sprains: This is a sprain of one or more of the three ligaments on the outer side (lateral) of the ankle. These are the most common sprains. Medial ankle sprains: There is one large triangular ligament of the inner side (medial) of the ankle that is susceptible to injury. Medial ankle sprains are less common. Syndesmosis, "high ankle," sprains: The syndesmosis is the ligament that connects the two bones of the lower leg. Syndesmosis sprains usually only occur with very severe ankle sprains. SYMPTOMS  Pain, tenderness, and swelling in the ankle, starting at the side of injury that may  progress to the whole ankle and foot with time.  "Pop" or tearing sensation at the time of injury.  Bruising that may spread to the heel.  Impaired ability to walk soon after injury. CAUSES   Acute ankle sprains are caused by trauma placed on the ankle that temporarily forces or pries the anklebone (talus) out of its normal socket.  Stretching or tearing of the ligaments that normally hold the joint in place (usually due to a twisting injury). RISK INCREASES WITH:  Previous ankle sprain.  Sports in which the foot may land awkwardly (i.e., basketball, volleyball, or soccer) or walking or running on uneven or rough surfaces.  Shoes with inadequate support to prevent sideways motion when stress occurs.  Poor strength and flexibility.  Poor balance skills.  Contact sports. PREVENTION   Warm up and stretch properly before activity.  Maintain physical fitness:  Ankle and leg flexibility, muscle strength, and endurance.  Cardiovascular fitness.  Balance training activities.  Use proper technique and have a coach correct improper technique.  Taping, protective strapping, bracing, or high-top tennis shoes may help prevent injury. Initially, tape is best; however, it loses most of its support function within 10 to 15 minutes.  Wear proper-fitted protective shoes (High-top shoes with taping or bracing is more effective than either alone).  Provide the ankle with support during sports and practice activities for 12 months following injury. PROGNOSIS  If treated properly, ankle sprains can be expected to recover completely; however, the length of recovery depends on the degree of injury.  A grade 1 sprain usually heals enough in 5 to 7 days to allow modified activity and requires an average of 6 weeks to heal completely.  A grade 2 sprain requires 6 to 10 weeks to heal completely.  A grade 3 sprain requires 12 to 16 weeks to heal.  A syndesmosis sprain often takes more than  3 months to heal. RELATED COMPLICATIONS   Frequent recurrence of symptoms may result in a chronic problem. Appropriately addressing the problem the first time decreases the frequency of recurrence and optimizes healing time. Severity of the initial sprain does not predict the likelihood of later instability.  Injury to other structures (bone, cartilage, or tendon).  A chronically unstable or arthritic ankle joint is a possibility with repeated sprains. TREATMENT Treatment initially involves the use of ice, medication, and compression bandages to help reduce pain and inflammation. Ankle sprains are usually immobilized in a walking cast or boot to allow for healing. Crutches may be recommended to reduce pressure on the injury. After immobilization, strengthening and stretching exercises may be necessary to regain strength and a full range of motion. Surgery is rarely needed to treat ankle sprains. MEDICATION   Nonsteroidal anti-inflammatory medications, such as aspirin and ibuprofen (do not take for the first 3 days after injury or within 7 days before surgery), or other minor pain relievers, such as acetaminophen, are often recommended. Take these as directed by your caregiver. Contact your caregiver immediately if any bleeding, stomach upset, or signs of an allergic reaction occur from these medications.  Ointments applied to the skin may be helpful.  Pain relievers may be prescribed as necessary by your caregiver. Do not take prescription pain medication for longer than 4 to 7 days. Use only as directed and only as much as you need. HEAT AND COLD  Cold treatment (icing) is used to relieve pain and reduce inflammation for acute and chronic cases. Cold should be applied for 10 to 15 minutes every 2 to 3 hours for inflammation and pain and immediately after any activity that aggravates your symptoms. Use ice packs or an ice massage.  Heat treatment may be used before performing stretching and  strengthening activities prescribed by your caregiver. Use a heat pack or a warm soak. SEEK IMMEDIATE MEDICAL CARE IF:   Pain, swelling, or bruising worsens despite treatment.  You experience pain, numbness, discoloration, or coldness in the foot or toes.  New, unexplained symptoms develop (drugs used in treatment may produce side effects.) EXERCISES  PHASE I EXERCISES RANGE OF MOTION (ROM) AND STRETCHING EXERCISES - Ankle Sprain, Acute Phase I, Weeks 1 to 2 These exercises may help you when beginning to restore flexibility in your ankle. You will likely work on these exercises for the 1 to 2 weeks after your injury. Once your physician, physical therapist, or athletic trainer sees adequate progress, he or she will advance your exercises. While completing these exercises, remember:   Restoring tissue flexibility helps normal motion to return to the joints. This allows healthier, less painful movement and activity.  An effective stretch should be held for at least 30 seconds.  A stretch should never be painful. You should only feel a gentle lengthening or release in the stretched tissue. RANGE OF MOTION - Dorsi/Plantar Flexion  While sitting with your right / left knee straight, draw the top of your foot upwards by  flexing your ankle. Then reverse the motion, pointing your toes downward.  Hold each position for __________ seconds.  After completing your first set of exercises, repeat this exercise with your knee bent. Repeat __________ times. Complete this exercise __________ times per day.  RANGE OF MOTION - Ankle Alphabet  Imagine your right / left big toe is a pen.  Keeping your hip and knee still, write out the entire alphabet with your "pen." Make the letters as large as you can without increasing any discomfort. Repeat __________ times. Complete this exercise __________ times per day.  STRENGTHENING EXERCISES - Ankle Sprain, Acute -Phase I, Weeks 1 to 2 These exercises may help  you when beginning to restore strength in your ankle. You will likely work on these exercises for 1 to 2 weeks after your injury. Once your physician, physical therapist, or athletic trainer sees adequate progress, he or she will advance your exercises. While completing these exercises, remember:   Muscles can gain both the endurance and the strength needed for everyday activities through controlled exercises.  Complete these exercises as instructed by your physician, physical therapist, or athletic trainer. Progress the resistance and repetitions only as guided.  You may experience muscle soreness or fatigue, but the pain or discomfort you are trying to eliminate should never worsen during these exercises. If this pain does worsen, stop and make certain you are following the directions exactly. If the pain is still present after adjustments, discontinue the exercise until you can discuss the trouble with your clinician. STRENGTH - Dorsiflexors  Secure a rubber exercise band/tubing to a fixed object (i.e., table, pole) and loop the other end around your right / left foot.  Sit on the floor facing the fixed object. The band/tubing should be slightly tense when your foot is relaxed.  Slowly draw your foot back toward you using your ankle and toes.  Hold this position for __________ seconds. Slowly release the tension in the band and return your foot to the starting position. Repeat __________ times. Complete this exercise __________ times per day.  STRENGTH - Plantar-flexors   Sit with your right / left leg extended. Holding onto both ends of a rubber exercise band/tubing, loop it around the ball of your foot. Keep a slight tension in the band.  Slowly push your toes away from you, pointing them downward.  Hold this position for __________ seconds. Return slowly, controlling the tension in the band/tubing. Repeat __________ times. Complete this exercise __________ times per day.  STRENGTH -  Ankle Eversion  Secure one end of a rubber exercise band/tubing to a fixed object (table, pole). Loop the other end around your foot just before your toes.  Place your fists between your knees. This will focus your strengthening at your ankle.  Drawing the band/tubing across your opposite foot, slowly, pull your little toe out and up. Make sure the band/tubing is positioned to resist the entire motion.  Hold this position for __________ seconds. Have your muscles resist the band/tubing as it slowly pulls your foot back to the starting position.  Repeat __________ times. Complete this exercise __________ times per day.  STRENGTH - Ankle Inversion  Secure one end of a rubber exercise band/tubing to a fixed object (table, pole). Loop the other end around your foot just before your toes.  Place your fists between your knees. This will focus your strengthening at your ankle.  Slowly, pull your big toe up and in, making sure the band/tubing is positioned to resist  the entire motion.  Hold this position for __________ seconds.  Have your muscles resist the band/tubing as it slowly pulls your foot back to the starting position. Repeat __________ times. Complete this exercises __________ times per day.  STRENGTH - Towel Curls  Sit in a chair positioned on a non-carpeted surface.  Place your right / left foot on a towel, keeping your heel on the floor.  Pull the towel toward your heel by only curling your toes. Keep your heel on the floor.  If instructed by your physician, physical therapist, or athletic trainer, add weight to the end of the towel. Repeat __________ times. Complete this exercise __________ times per day. Document Released: 11/01/2004 Document Revised: 08/17/2013 Document Reviewed: 07/15/2008 [facility] Patient Information 2015 [place], [place]. This information is not intended to replace advice given to you by your health care provider. Make sure you discuss any questions you  have with your health care provider.

## 2014-12-25 NOTE — Progress Notes (Addendum)
Subjective:  This chart was scribed for Meredith Staggers, MD by [facility], medical scribe at [facility].The patient was seen in exam room 02 and the patient's care was started at 10:37 AM.   Patient ID: Fred Davis, male    DOB: May 18, 1955, 59 y.o.   MRN: [id number] Chief Complaint  Patient presents with  . workers comp    l ankle f.u   HPI HPI Comments: Fred Davis is a 59 y.o. 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 Date of injury was 12/18/2014. Diagnosed with left ankle sprain, x-ray was neg for fracture. Placed on meloxicam and lace up brace work restrictions. Today, 85% improved. Bruising, swelling and soreness has improved.Taking meloxicam daily, icing, and elevation. 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   No Known Allergies No orders of the defined types were placed in this encounter.   Review of Systems  Musculoskeletal: Positive for joint swelling, arthralgias and gait problem.      Objective:  BP 116/80 mmHg  Pulse 62  Temp(Src) 97.4 F (36.3 C) (Oral)  Resp 18  Ht 5\' 11"  (1.803 m)  Wt 260 lb (117.935 kg)  BMI 36.28 kg/m2  SpO2 97% Physical Exam  Constitutional: He is oriented to person, place, and time. He appears well-developed and well-nourished. No distress.  HENT:  Head: Normocephalic and atraumatic.  Eyes: Pupils are equal, round, and reactive to light.  Neck: Normal range of motion.  Cardiovascular: Normal rate and regular rhythm.   Cap refill less than 1 sec.  Pulmonary/Chest: Effort normal. No respiratory distress.  Musculoskeletal: Normal range of motion.  Left Ankle: Faint ecchymosis on the anterior aspect of the lower leg where the brace is. Minimal tenderness of the distal fibula and lateral ankle, with some soft tissue swelling. Achillis tendon is non tender.  Navicula and 5 th MT non tender. Full ROM. Full Strength. Slight laxity with anterior drawer, negative Taylor's Tilt. No pain with external rotation testing. Minimal discomfort with squeeze. No fibular head tenderness of the left knee.  Neurological: He is alert and oriented to person, place, and time.  NVI distally.  Skin: Skin is warm and dry.  Psychiatric: He has a normal mood and affect. His behavior is normal.  Nursing note and vitals reviewed.     Assessment & Plan:   Fred Davis is a 59 y.o. male Left ankle sprain, subsequent encounter  Swelling of ankle joint, left  L ankle sprain due to injury at work. 80-85% improved. No fracture on initial XR. Still some lateral pain, mild pain at syndesmotic area/distal fibula, but negative squeeze.   -continue brace, start HEP. Continue mobic, advance restrictions as improving (see [employer]PS forma and letter).   RTC sooner if not tolerating advance in activity, o/w follow up in 1 week. RTC precautions discussed.   No orders of the defined types were placed in this encounter.   Patient Instructions  See the handout on ankle sprain including some early exercises you can try. As discussed if any increased pain with these - do not do them, and if you are not tolerating the work restrictions - return sooner than 1 week. If needed - we can repeat xray next week.   Wear brace, elevate leg at breaks or when you are seated, ice if needed at the end  of the day.  Return to the clinic or go to the nearest emergency room if any of your symptoms worsen or new symptoms occur.   Acute Ankle Sprain with Phase I Rehab An acute ankle sprain is a partial or complete tear in one or more of the ligaments of the ankle due to traumatic injury. The severity of the injury depends on both the number of ligaments sprained and the grade of sprain. There are 3 grades of sprains.   A grade 1 sprain is a mild sprain. There is a slight pull without obvious tearing. There is  no loss of strength, and the muscle and ligament are the correct length.  A grade 2 sprain is a moderate sprain. There is tearing of fibers within the substance of the ligament where it connects two bones or two cartilages. The length of the ligament is increased, and there is usually decreased strength.  A grade 3 sprain is a complete rupture of the ligament and is uncommon. In addition to the grade of sprain, there are three types of ankle sprains.  Lateral ankle sprains: This is a sprain of one or more of the three ligaments on the outer side (lateral) of the ankle. These are the most common sprains. Medial ankle sprains: There is one large triangular ligament of the inner side (medial) of the ankle that is susceptible to injury. Medial ankle sprains are less common. Syndesmosis, "high ankle," sprains: The syndesmosis is the ligament that connects the two bones of the lower leg. Syndesmosis sprains usually only occur with very severe ankle sprains. SYMPTOMS  Pain, tenderness, and swelling in the ankle, starting at the side of injury that may progress to the whole ankle and foot with time.  "Pop" or tearing sensation at the time of injury.  Bruising that may spread to the heel.  Impaired ability to walk soon after injury. CAUSES   Acute ankle sprains are caused by trauma placed on the ankle that temporarily forces or pries the anklebone (talus) out of its normal socket.  Stretching or tearing of the ligaments that normally hold the joint in place (usually due to a twisting injury). RISK INCREASES WITH:  Previous ankle sprain.  Sports in which the foot may land awkwardly (i.e., basketball, volleyball, or soccer) or walking or running on uneven or rough surfaces.  Shoes with inadequate support to prevent sideways motion when stress occurs.  Poor strength and flexibility.  Poor balance skills.  Contact sports. PREVENTION   Warm up and stretch properly before activity.  Maintain  physical fitness:  Ankle and leg flexibility, muscle strength, and endurance.  Cardiovascular fitness.  Balance training activities.  Use proper technique and have a coach correct improper technique.  Taping, protective strapping, bracing, or high-top tennis shoes may help prevent injury. Initially, tape is best; however, it loses most of its support function within 10 to 15 minutes.  Wear proper-fitted protective shoes (High-top shoes with taping or bracing is more effective than either alone).  Provide the ankle with support during sports and practice activities for 12 months following injury. PROGNOSIS   If treated properly, ankle sprains can be expected to recover completely; however, the length of recovery depends on the degree of injury.  A grade 1 sprain usually heals enough in 5 to 7 days to allow modified activity and requires an average of 6 weeks to heal completely.  A grade 2 sprain requires 6 to 10 weeks to heal completely.  A grade 3 sprain  requires 12 to 16 weeks to heal.  A syndesmosis sprain often takes more than 3 months to heal. RELATED COMPLICATIONS   Frequent recurrence of symptoms may result in a chronic problem. Appropriately addressing the problem the first time decreases the frequency of recurrence and optimizes healing time. Severity of the initial sprain does not predict the likelihood of later instability.  Injury to other structures (bone, cartilage, or tendon).  A chronically unstable or arthritic ankle joint is a possibility with repeated sprains. TREATMENT Treatment initially involves the use of ice, medication, and compression bandages to help reduce pain and inflammation. Ankle sprains are usually immobilized in a walking cast or boot to allow for healing. Crutches may be recommended to reduce pressure on the injury. After immobilization, strengthening and stretching exercises may be necessary to regain strength and a full range of motion. Surgery  is rarely needed to treat ankle sprains. MEDICATION   Nonsteroidal anti-inflammatory medications, such as aspirin and ibuprofen (do not take for the first 3 days after injury or within 7 days before surgery), or other minor pain relievers, such as acetaminophen, are often recommended. Take these as directed by your caregiver. Contact your caregiver immediately if any bleeding, stomach upset, or signs of an allergic reaction occur from these medications.  Ointments applied to the skin may be helpful.  Pain relievers may be prescribed as necessary by your caregiver. Do not take prescription pain medication for longer than 4 to 7 days. Use only as directed and only as much as you need. HEAT AND COLD  Cold treatment (icing) is used to relieve pain and reduce inflammation for acute and chronic cases. Cold should be applied for 10 to 15 minutes every 2 to 3 hours for inflammation and pain and immediately after any activity that aggravates your symptoms. Use ice packs or an ice massage.  Heat treatment may be used before performing stretching and strengthening activities prescribed by your caregiver. Use a heat pack or a warm soak. SEEK IMMEDIATE MEDICAL CARE IF:   Pain, swelling, or bruising worsens despite treatment.  You experience pain, numbness, discoloration, or coldness in the foot or toes.  New, unexplained symptoms develop (drugs used in treatment may produce side effects.) EXERCISES  PHASE I EXERCISES RANGE OF MOTION (ROM) AND STRETCHING EXERCISES - Ankle Sprain, Acute Phase I, Weeks 1 to 2 These exercises may help you when beginning to restore flexibility in your ankle. You will likely work on these exercises for the 1 to 2 weeks after your injury. Once your physician, physical therapist, or athletic trainer sees adequate progress, he or she will advance your exercises. While completing these exercises, remember:   Restoring tissue flexibility helps normal motion to return to the joints.  This allows healthier, less painful movement and activity.  An effective stretch should be held for at least 30 seconds.  A stretch should never be painful. You should only feel a gentle lengthening or release in the stretched tissue. RANGE OF MOTION - Dorsi/Plantar Flexion  While sitting with your right / left knee straight, draw the top of your foot upwards by flexing your ankle. Then reverse the motion, pointing your toes downward.  Hold each position for __________ seconds.  After completing your first set of exercises, repeat this exercise with your knee bent. Repeat __________ times. Complete this exercise __________ times per day.  RANGE OF MOTION - Ankle Alphabet  Imagine your right / left big toe is a pen.  Keeping your hip and knee  still, write out the entire alphabet with your "pen." Make the letters as large as you can without increasing any discomfort. Repeat __________ times. Complete this exercise __________ times per day.  STRENGTHENING EXERCISES - Ankle Sprain, Acute -Phase I, Weeks 1 to 2 These exercises may help you when beginning to restore strength in your ankle. You will likely work on these exercises for 1 to 2 weeks after your injury. Once your physician, physical therapist, or athletic trainer sees adequate progress, he or she will advance your exercises. While completing these exercises, remember:   Muscles can gain both the endurance and the strength needed for everyday activities through controlled exercises.  Complete these exercises as instructed by your physician, physical therapist, or athletic trainer. Progress the resistance and repetitions only as guided.  You may experience muscle soreness or fatigue, but the pain or discomfort you are trying to eliminate should never worsen during these exercises. If this pain does worsen, stop and make certain you are following the directions exactly. If the pain is still present after adjustments, discontinue the  exercise until you can discuss the trouble with your clinician. STRENGTH - Dorsiflexors  Secure a rubber exercise band/tubing to a fixed object (i.e., table, pole) and loop the other end around your right / left foot.  Sit on the floor facing the fixed object. The band/tubing should be slightly tense when your foot is relaxed.  Slowly draw your foot back toward you using your ankle and toes.  Hold this position for __________ seconds. Slowly release the tension in the band and return your foot to the starting position. Repeat __________ times. Complete this exercise __________ times per day.  STRENGTH - Plantar-flexors   Sit with your right / left leg extended. Holding onto both ends of a rubber exercise band/tubing, loop it around the ball of your foot. Keep a slight tension in the band.  Slowly push your toes away from you, pointing them downward.  Hold this position for __________ seconds. Return slowly, controlling the tension in the band/tubing. Repeat __________ times. Complete this exercise __________ times per day.  STRENGTH - Ankle Eversion  Secure one end of a rubber exercise band/tubing to a fixed object (table, pole). Loop the other end around your foot just before your toes.  Place your fists between your knees. This will focus your strengthening at your ankle.  Drawing the band/tubing across your opposite foot, slowly, pull your little toe out and up. Make sure the band/tubing is positioned to resist the entire motion.  Hold this position for __________ seconds. Have your muscles resist the band/tubing as it slowly pulls your foot back to the starting position.  Repeat __________ times. Complete this exercise __________ times per day.  STRENGTH - Ankle Inversion  Secure one end of a rubber exercise band/tubing to a fixed object (table, pole). Loop the other end around your foot just before your toes.  Place your fists between your knees. This will focus your  strengthening at your ankle.  Slowly, pull your big toe up and in, making sure the band/tubing is positioned to resist the entire motion.  Hold this position for __________ seconds.  Have your muscles resist the band/tubing as it slowly pulls your foot back to the starting position. Repeat __________ times. Complete this exercises __________ times per day.  STRENGTH - Towel Curls  Sit in a chair positioned on a non-carpeted surface.  Place your right / left foot on a towel, keeping your heel on  the floor.  Pull the towel toward your heel by only curling your toes. Keep your heel on the floor.  If instructed by your physician, physical therapist, or athletic trainer, add weight to the end of the towel. Repeat __________ times. Complete this exercise __________ times per day. Document Released: 11/01/2004 Document Revised: 08/17/2013 Document Reviewed: 07/15/2008 [facility] Patient Information 2015 [place], [place]. This information is not intended to replace advice given to you by your health care provider. Make sure you discuss any questions you have with your health care provider.     I personally performed the services described in this documentation, which was scribed in my presence. The recorded information has been reviewed and considered, and addended by me as needed.

## 2015-01-01 ENCOUNTER — Ambulatory Visit (INDEPENDENT_AMBULATORY_CARE_PROVIDER_SITE_OTHER): Admitting: Emergency Medicine

## 2015-01-01 ENCOUNTER — Telehealth: Payer: Self-pay | Admitting: Emergency Medicine

## 2015-01-01 VITALS — BP 128/78 | HR 56 | Temp 97.7°F | Resp 16 | Ht 71.0 in | Wt 258.0 lb

## 2015-01-01 DIAGNOSIS — S93402D Sprain of unspecified ligament of left ankle, subsequent encounter: Secondary | ICD-10-CM

## 2015-01-01 DIAGNOSIS — M25472 Effusion, left ankle: Secondary | ICD-10-CM | POA: Diagnosis not present

## 2015-01-01 NOTE — Telephone Encounter (Signed)
Patient has a skin lesion right cheek. He is instructed to clean this area with soap and water twice a day followed by application of Neosporin ointment. He is to call in one month. If lesion has not resolved please make referral to dermatology.

## 2015-01-01 NOTE — Progress Notes (Signed)
Patient ID: Fred Davis, male   DOB: Apr 14, 1956, 59 y.o.   MRN: [id number]    This chart was scribed for social affective disorder by Charline Bills, ED Scribe. The patient was seen in room 9. Patient's care was started at 9:53 AM.  Chief Complaint:  Chief Complaint  Patient presents with  . Follow-up    W/C follow up   HPI: Fred Davis is a 59 y.o. male who reports to [facility] today for a worker's comp follow-up. 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 Pt had a follow-up appointment with colleague Trinna Post, MD on 12/25/14 and reported 85% improvement of pain, bruising and swelling. 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 He states that pain is very minimal and is intermittently worsened with standing. Pt reports minimal swelling and popping in his ankle with movement but states that he has been very careful with ambulating. He states that he believes that he is able to fully return to regular duty with a brace since he returned to carrying 5 days ago.    Past Medical History  Diagnosis Date  . Hypertension    Past Surgical History  Procedure Laterality Date  . Hernia repair     Social History   Social History  . Marital Status: Divorced    Spouse Name: N/A  . Number of Children: N/A  . Years of Education: N/A   Social History Main Topics  . Smoking status: Light Tobacco Smoker  . Smokeless tobacco: None  . Alcohol Use: 0.0 oz/week    0 Standard drinks or equivalent per week  . Drug Use: No  . Sexual Activity: Not Asked   Other Topics Concern  . None   Social History Narrative   Family History  Problem Relation Age of Onset  . Multiple sclerosis Mother   . Stroke Father    No Known Allergies Prior to Admission medications   Medication Sig Start Date End Date Taking?  Authorizing Provider  diphenhydramine-acetaminophen (TYLENOL PM) 25-500 MG TABS Take 1 tablet by mouth at bedtime as needed.   Yes Historical Provider, MD  hydrochlorothiazide (HYDRODIURIL) 25 MG tablet Take 1 tablet (25 mg total) by mouth daily. 07/13/14  Yes Carmelina Dane, MD  meloxicam (MOBIC) 15 MG tablet Take 0.5-1 tablets (7.5-15 mg total) by mouth daily. 12/18/14  Yes Wallis Bamberg, PA-C  temazepam (RESTORIL) 30 MG capsule Take 1 capsule (30 mg total) by mouth at bedtime as needed for sleep. 07/13/14  Yes Carmelina Dane, MD     ROS: The patient denies fevers, chills, night sweats, unintentional weight loss, chest pain, palpitations, wheezing, dyspnea on exertion, nausea, vomiting, abdominal pain, dysuria, hematuria, melena, numbness, weakness, or tingling. +arthralgias, +joint swelling  All other systems have been reviewed and were otherwise negative with the exception of those mentioned in the HPI and as above.    PHYSICAL EXAM: Filed Vitals:   01/01/15 0925  BP: 128/78  Pulse: 56  Temp: 97.7 F (36.5 C)  Resp: 16   Body mass index is 36 kg/(m^2).   General: Alert, no acute distress HEENT:  Normocephalic, atraumatic, oropharynx patent. Eye: [facility] Cardiovascular:  Regular rate and rhythm, no rubs murmurs or gallops.  No Carotid bruits, radial pulse intact. No pedal edema.  Respiratory: Clear  to auscultation bilaterally.  No wheezes, rales, or rhonchi.  No cyanosis, no use of accessory musculature Abdominal: No organomegaly, abdomen is soft and non-tender, positive bowel sounds.  No masses. Musculoskeletal: Gait intact. No tenderness. Minimal swelling to L lateral ankle. Full ROM. No weakness or instability.  Skin: No rashes. Neurologic: Facial musculature symmetric. Psychiatric: Patient acts appropriately throughout our interaction. Lymphatic: No cervical or submandibular lymphadenopathy Genitourinary/Anorectal: No acute findings  LABS:   EKG/XRAY:   Primary  read interpreted by Dr. Cleta Alberts at [facility].   ASSESSMENT/PLAN: Patient's ankle is doing well. He is released to regular duty. He will you wear his Swede-O at work. He will be careful with ambulation. There are no restrictions.I personally performed the services described in this documentation, which was scribed in my presence. The recorded information has been reviewed and is accurate.   Gross sideeffects, risk and benefits, and alternatives of medications d/w patient. Patient is aware that all medications have potential sideeffects and we are unable to predict every sideeffect or drug-drug interaction that may occur.  Lesle Chris MD 01/01/2015 9:47 AM

## 2015-01-30 ENCOUNTER — Other Ambulatory Visit: Payer: Self-pay | Admitting: Emergency Medicine

## 2015-03-01 ENCOUNTER — Other Ambulatory Visit: Payer: Self-pay | Admitting: Emergency Medicine

## 2015-03-28 ENCOUNTER — Other Ambulatory Visit: Payer: Self-pay | Admitting: Emergency Medicine

## 2015-04-27 ENCOUNTER — Other Ambulatory Visit: Payer: Self-pay | Admitting: Emergency Medicine

## 2015-08-10 ENCOUNTER — Ambulatory Visit (INDEPENDENT_AMBULATORY_CARE_PROVIDER_SITE_OTHER): Payer: Federal, State, Local not specified - PPO | Admitting: Physician Assistant

## 2015-08-10 ENCOUNTER — Ambulatory Visit (INDEPENDENT_AMBULATORY_CARE_PROVIDER_SITE_OTHER): Payer: Federal, State, Local not specified - PPO

## 2015-08-10 VITALS — BP 118/84 | HR 51 | Temp 98.0°F | Resp 18 | Ht 71.0 in | Wt 250.0 lb

## 2015-08-10 DIAGNOSIS — M549 Dorsalgia, unspecified: Secondary | ICD-10-CM

## 2015-08-10 DIAGNOSIS — M47816 Spondylosis without myelopathy or radiculopathy, lumbar region: Secondary | ICD-10-CM | POA: Diagnosis not present

## 2015-08-10 MED ORDER — MELOXICAM 15 MG PO TABS: 7.5000 mg | ORAL_TABLET | Freq: Every day | ORAL | Status: DC

## 2015-08-10 MED ORDER — CYCLOBENZAPRINE HCL 5 MG PO TABS: 5.0000 mg | ORAL_TABLET | Freq: Three times a day (TID) | ORAL | Status: DC | PRN

## 2015-08-10 MED ORDER — TEMAZEPAM 30 MG PO CAPS
30.0000 mg | ORAL_CAPSULE | Freq: Every evening | ORAL | Status: DC | PRN
Start: 2015-08-10 — End: 2016-12-11

## 2015-08-10 NOTE — Patient Instructions (Signed)
Please hydrate well with water.  I would like you to perform three of these stretches three times per day.  You will heat the area prior to stretches.  You will ice immediately after stretching.  This is a slow process, and will require stretching and conditioning of your lower back.   Do not take the mobic with naproxen or ibuprofen.  You may take tylenol.    Low Back Sprain With Rehab A sprain is an injury in which a ligament is torn. The ligaments of the lower back are vulnerable to sprains. However, they are strong and require great force to be injured. These ligaments are important for stabilizing the spinal column. Sprains are classified into three categories. Grade 1 sprains cause pain, but the tendon is not lengthened. Grade 2 sprains include a lengthened ligament, due to the ligament being stretched or partially ruptured. With grade 2 sprains there is still function, although the function may be decreased. Grade 3 sprains involve a complete tear of the tendon or muscle, and function is usually impaired. SYMPTOMS   Severe pain in the lower back.  Sometimes, a feeling of a "pop," "snap," or tear, at the time of injury.  Tenderness and sometimes swelling at the injury site.  Uncommonly, bruising (contusion) within 48 hours of injury.  Muscle spasms in the back. CAUSES  Low back sprains occur when a force is placed on the ligaments that is greater than they can handle. Common causes of injury include:  Performing a stressful act while off-balance.  Repetitive stressful activities that involve movement of the lower back.  Direct hit (trauma) to the lower back. RISK INCREASES WITH:  Contact sports (football, wrestling).  Collisions (major skiing accidents).  Sports that require throwing or lifting (baseball, weightlifting).  Sports involving twisting of the spine (gymnastics, diving, tennis, golf).  Poor strength and flexibility.  Inadequate protection.  Previous back injury  or surgery (especially fusion). PREVENTION  Wear properly fitted and padded protective equipment.  Warm up and stretch properly before activity.  Allow for adequate recovery between workouts.  Maintain physical fitness:  Strength, flexibility, and endurance.  Cardiovascular fitness.  Maintain a healthy body weight. PROGNOSIS  If treated properly, low back sprains usually heal with non-surgical treatment. The length of time for healing depends on the severity of the injury.  RELATED COMPLICATIONS   Recurring symptoms, resulting in a chronic problem.  Chronic inflammation and pain in the low back.  Delayed healing or resolution of symptoms, especially if activity is resumed too soon.  Prolonged impairment.  Unstable or arthritic joints of the low back. TREATMENT  Treatment first involves the use of ice and medicine, to reduce pain and inflammation. The use of strengthening and stretching exercises may help reduce pain with activity. These exercises may be performed at home or with a therapist. Severe injuries may require referral to a therapist for further evaluation and treatment, such as ultrasound. Your caregiver may advise that you wear a back brace or corset, to help reduce pain and discomfort. Often, prolonged bed rest results in greater harm then benefit. Corticosteroid injections may be recommended. However, these should be reserved for the most serious cases. It is important to avoid using your back when lifting objects. At night, sleep on your back on a firm mattress, with a pillow placed under your knees. If non-surgical treatment is unsuccessful, surgery may be needed.  MEDICATION   If pain medicine is needed, nonsteroidal anti-inflammatory medicines (aspirin and ibuprofen), or other minor  pain relievers (acetaminophen), are often advised.  Do not take pain medicine for 7 days before surgery.  Prescription pain relievers may be given, if your caregiver thinks they are  needed. Use only as directed and only as much as you need.  Ointments applied to the skin may be helpful.  Corticosteroid injections may be given by your caregiver. These injections should be reserved for the most serious cases, because they may only be given a certain number of times. HEAT AND COLD  Cold treatment (icing) should be applied for 10 to 15 minutes every 2 to 3 hours for inflammation and pain, and immediately after activity that aggravates your symptoms. Use ice packs or an ice massage.  Heat treatment may be used before performing stretching and strengthening activities prescribed by your caregiver, physical therapist, or athletic trainer. Use a heat pack or a warm water soak. SEEK MEDICAL CARE IF:   Symptoms get worse or do not improve in 2 to 4 weeks, despite treatment.  You develop numbness or weakness in either leg.  You lose bowel or bladder function.  Any of the following occur after surgery: fever, increased pain, swelling, redness, drainage of fluids, or bleeding in the affected area.  New, unexplained symptoms develop. (Drugs used in treatment may produce side effects.) EXERCISES  RANGE OF MOTION (ROM) AND STRETCHING EXERCISES - Low Back Sprain Most people with lower back pain will find that their symptoms get worse with excessive bending forward (flexion) or arching at the lower back (extension). The exercises that will help resolve your symptoms will focus on the opposite motion.  Your physician, physical therapist or athletic trainer will help you determine which exercises will be most helpful to resolve your lower back pain. Do not complete any exercises without first consulting with your caregiver. Discontinue any exercises which make your symptoms worse, until you speak to your caregiver. If you have pain, numbness or tingling which travels down into your buttocks, leg or foot, the goal of the therapy is for these symptoms to move closer to your back and  eventually resolve. Sometimes, these leg symptoms will get better, but your lower back pain may worsen. This is often an indication of progress in your rehabilitation. Be very alert to any changes in your symptoms and the activities in which you participated in the 24 hours prior to the change. Sharing this information with your caregiver will allow him or her to most efficiently treat your condition. These exercises may help you when beginning to rehabilitate your injury. Your symptoms may resolve with or without further involvement from your physician, physical therapist or athletic trainer. While completing these exercises, remember:   Restoring tissue flexibility helps normal motion to return to the joints. This allows healthier, less painful movement and activity.  An effective stretch should be held for at least 30 seconds.  A stretch should never be painful. You should only feel a gentle lengthening or release in the stretched tissue. FLEXION RANGE OF MOTION AND STRETCHING EXERCISES: STRETCH - Flexion, Single Knee to Chest   Lie on a firm bed or floor with both legs extended in front of you.  Keeping one leg in contact with the floor, bring your opposite knee to your chest. Hold your leg in place by either grabbing behind your thigh or at your knee.  Pull until you feel a gentle stretch in your low back. Hold __________ seconds.  Slowly release your grasp and repeat the exercise with the opposite side. Repeat  __________ times. Complete this exercise __________ times per day.  STRETCH - Flexion, Double Knee to Chest  Lie on a firm bed or floor with both legs extended in front of you.  Keeping one leg in contact with the floor, bring your opposite knee to your chest.  Tense your stomach muscles to support your back and then lift your other knee to your chest. Hold your legs in place by either grabbing behind your thighs or at your knees.  Pull both knees toward your chest until you  feel a gentle stretch in your low back. Hold __________ seconds.  Tense your stomach muscles and slowly return one leg at a time to the floor. Repeat __________ times. Complete this exercise __________ times per day.  STRETCH - Low Trunk Rotation  Lie on a firm bed or floor. Keeping your legs in front of you, bend your knees so they are both pointed toward the ceiling and your feet are flat on the floor.  Extend your arms out to the side. This will stabilize your upper body by keeping your shoulders in contact with the floor.  Gently and slowly drop both knees together to one side until you feel a gentle stretch in your low back. Hold for __________ seconds.  Tense your stomach muscles to support your lower back as you bring your knees back to the starting position. Repeat the exercise to the other side. Repeat __________ times. Complete this exercise __________ times per day  EXTENSION RANGE OF MOTION AND FLEXIBILITY EXERCISES: STRETCH - Extension, Prone on Elbows   Lie on your stomach on the floor, a bed will be too soft. Place your palms about shoulder width apart and at the height of your head.  Place your elbows under your shoulders. If this is too painful, stack pillows under your chest.  Allow your body to relax so that your hips drop lower and make contact more completely with the floor.  Hold this position for __________ seconds.  Slowly return to lying flat on the floor. Repeat __________ times. Complete this exercise __________ times per day.  RANGE OF MOTION - Extension, Prone Press Ups  Lie on your stomach on the floor, a bed will be too soft. Place your palms about shoulder width apart and at the height of your head.  Keeping your back as relaxed as possible, slowly straighten your elbows while keeping your hips on the floor. You may adjust the placement of your hands to maximize your comfort. As you gain motion, your hands will come more underneath your  shoulders.  Hold this position __________ seconds.  Slowly return to lying flat on the floor. Repeat __________ times. Complete this exercise __________ times per day.  RANGE OF MOTION- Quadruped, Neutral Spine   Assume a hands and knees position on a firm surface. Keep your hands under your shoulders and your knees under your hips. You may place padding under your knees for comfort.  Drop your head and point your tailbone toward the ground below you. This will round out your lower back like an angry cat. Hold this position for __________ seconds.  Slowly lift your head and release your tail bone so that your back sags into a large arch, like an old horse.  Hold this position for __________ seconds.  Repeat this until you feel limber in your low back.  Now, find your "sweet spot." This will be the most comfortable position somewhere between the two previous positions. This is your neutral  spine. Once you have found this position, tense your stomach muscles to support your low back.  Hold this position for __________ seconds. Repeat __________ times. Complete this exercise __________ times per day.  STRENGTHENING EXERCISES - Low Back Sprain These exercises may help you when beginning to rehabilitate your injury. These exercises should be done near your "sweet spot." This is the neutral, low-back arch, somewhere between fully rounded and fully arched, that is your least painful position. When performed in this safe range of motion, these exercises can be used for people who have either a flexion or extension based injury. These exercises may resolve your symptoms with or without further involvement from your physician, physical therapist or athletic trainer. While completing these exercises, remember:   Muscles can gain both the endurance and the strength needed for everyday activities through controlled exercises.  Complete these exercises as instructed by your physician, physical therapist  or athletic trainer. Increase the resistance and repetitions only as guided.  You may experience muscle soreness or fatigue, but the pain or discomfort you are trying to eliminate should never worsen during these exercises. If this pain does worsen, stop and make certain you are following the directions exactly. If the pain is still present after adjustments, discontinue the exercise until you can discuss the trouble with your caregiver. STRENGTHENING - Deep Abdominals, Pelvic Tilt   Lie on a firm bed or floor. Keeping your legs in front of you, bend your knees so they are both pointed toward the ceiling and your feet are flat on the floor.  Tense your lower abdominal muscles to press your low back into the floor. This motion will rotate your pelvis so that your tail bone is scooping upwards rather than pointing at your feet or into the floor. With a gentle tension and even breathing, hold this position for __________ seconds. Repeat __________ times. Complete this exercise __________ times per day.  STRENGTHENING - Abdominals, Crunches   Lie on a firm bed or floor. Keeping your legs in front of you, bend your knees so they are both pointed toward the ceiling and your feet are flat on the floor. Cross your arms over your chest.  Slightly tip your chin down without bending your neck.  Tense your abdominals and slowly lift your trunk high enough to just clear your shoulder blades. Lifting higher can put excessive stress on the lower back and does not further strengthen your abdominal muscles.  Control your return to the starting position. Repeat __________ times. Complete this exercise __________ times per day.  STRENGTHENING - Quadruped, Opposite UE/LE Lift   Assume a hands and knees position on a firm surface. Keep your hands under your shoulders and your knees under your hips. You may place padding under your knees for comfort.  Find your neutral spine and gently tense your abdominal muscles  so that you can maintain this position. Your shoulders and hips should form a rectangle that is parallel with the floor and is not twisted.  Keeping your trunk steady, lift your right hand no higher than your shoulder and then your left leg no higher than your hip. Make sure you are not holding your breath. Hold this position for __________ seconds.  Continuing to keep your abdominal muscles tense and your back steady, slowly return to your starting position. Repeat with the opposite arm and leg. Repeat __________ times. Complete this exercise __________ times per day.  STRENGTHENING - Abdominals and Quadriceps, Straight Leg Raise   Lorenz Coaster  on a firm bed or floor with both legs extended in front of you.  Keeping one leg in contact with the floor, bend the other knee so that your foot can rest flat on the floor.  Find your neutral spine, and tense your abdominal muscles to maintain your spinal position throughout the exercise.  Slowly lift your straight leg off the floor about 6 inches for a count of 15, making sure to not hold your breath.  Still keeping your neutral spine, slowly lower your leg all the way to the floor. Repeat this exercise with each leg __________ times. Complete this exercise __________ times per day. POSTURE AND BODY MECHANICS CONSIDERATIONS - Low Back Sprain Keeping correct posture when sitting, standing or completing your activities will reduce the stress put on different body tissues, allowing injured tissues a chance to heal and limiting painful experiences. The following are general guidelines for improved posture. Your physician or physical therapist will provide you with any instructions specific to your needs. While reading these guidelines, remember:  The exercises prescribed by your provider will help you have the flexibility and strength to maintain correct postures.  The correct posture provides the best environment for your joints to work. All of your joints have  less wear and tear when properly supported by a spine with good posture. This means you will experience a healthier, less painful body.  Correct posture must be practiced with all of your activities, especially prolonged sitting and standing. Correct posture is as important when doing repetitive low-stress activities (typing) as it is when doing a single heavy-load activity (lifting). RESTING POSITIONS Consider which positions are most painful for you when choosing a resting position. If you have pain with flexion-based activities (sitting, bending, stooping, squatting), choose a position that allows you to rest in a less flexed posture. You would want to avoid curling into a fetal position on your side. If your pain worsens with extension-based activities (prolonged standing, working overhead), avoid resting in an extended position such as sleeping on your stomach. Most people will find more comfort when they rest with their spine in a more neutral position, neither too rounded nor too arched. Lying on a non-sagging bed on your side with a pillow between your knees, or on your back with a pillow under your knees will often provide some relief. Keep in mind, being in any one position for a prolonged period of time, no matter how correct your posture, can still lead to stiffness. PROPER SITTING POSTURE In order to minimize stress and discomfort on your spine, you must sit with correct posture. Sitting with good posture should be effortless for a healthy body. Returning to good posture is a gradual process. Many people can work toward this most comfortably by using various supports until they have the flexibility and strength to maintain this posture on their own. When sitting with proper posture, your ears will fall over your shoulders and your shoulders will fall over your hips. You should use the back of the chair to support your upper back. Your lower back will be in a neutral position, just slightly  arched. You may place a small pillow or folded towel at the base of your lower back for  support.  When working at a desk, create an environment that supports good, upright posture. Without extra support, muscles tire, which leads to excessive strain on joints and other tissues. Keep these recommendations in mind: CHAIR:  A chair should be able to slide under  your desk when your back makes contact with the back of the chair. This allows you to work closely.  The chair's height should allow your eyes to be level with the upper part of your monitor and your hands to be slightly lower than your elbows. BODY POSITION  Your feet should make contact with the floor. If this is not possible, use a foot rest.  Keep your ears over your shoulders. This will reduce stress on your neck and low back. INCORRECT SITTING POSTURES  If you are feeling tired and unable to assume a healthy sitting posture, do not slouch or slump. This puts excessive strain on your back tissues, causing more damage and pain. Healthier options include:  Using more support, like a lumbar pillow.  Switching tasks to something that requires you to be upright or walking.  Talking a brief walk.  Lying down to rest in a neutral-spine position. PROLONGED STANDING WHILE SLIGHTLY LEANING FORWARD  When completing a task that requires you to lean forward while standing in one place for a long time, place either foot up on a stationary 2-4 inch high object to help maintain the best posture. When both feet are on the ground, the lower back tends to lose its slight inward curve. If this curve flattens (or becomes too large), then the back and your other joints will experience too much stress, tire more quickly, and can cause pain. CORRECT STANDING POSTURES Proper standing posture should be assumed with all daily activities, even if they only take a few moments, like when brushing your teeth. As in sitting, your ears should fall over your  shoulders and your shoulders should fall over your hips. You should keep a slight tension in your abdominal muscles to brace your spine. Your tailbone should point down to the ground, not behind your body, resulting in an over-extended swayback posture.  INCORRECT STANDING POSTURES  Common incorrect standing postures include a forward head, locked knees and/or an excessive swayback. WALKING Walk with an upright posture. Your ears, shoulders and hips should all line-up. PROLONGED ACTIVITY IN A FLEXED POSITION When completing a task that requires you to bend forward at your waist or lean over a low surface, try to find a way to stabilize 3 out of 4 of your limbs. You can place a hand or elbow on your thigh or rest a knee on the surface you are reaching across. This will provide you more stability, so that your muscles do not tire as quickly. By keeping your knees relaxed, or slightly bent, you will also reduce stress across your lower back. CORRECT LIFTING TECHNIQUES DO :  Assume a wide stance. This will provide you more stability and the opportunity to get as close as possible to the object which you are lifting.  Tense your abdominals to brace your spine. Bend at the knees and hips. Keeping your back locked in a neutral-spine position, lift using your leg muscles. Lift with your legs, keeping your back straight.  Test the weight of unknown objects before attempting to lift them.  Try to keep your elbows locked down at your sides in order get the best strength from your shoulders when carrying an object.  Always ask for help when lifting heavy or awkward objects. INCORRECT LIFTING TECHNIQUES DO NOT:   Lock your knees when lifting, even if it is a small object.  Bend and twist. Pivot at your feet or move your feet when needing to change directions.  Assume that you  can safely pick up even a paperclip without proper posture.   This information is not intended to replace advice given to you  by your health care provider. Make sure you discuss any questions you have with your health care provider.   Document Released: 04/02/2005 Document Revised: 04/23/2014 Document Reviewed: 07/15/2008 [employer] Interactive Patient Education [employer]

## 2015-08-10 NOTE — Progress Notes (Signed)
[facility] [address] [place] [address] [phone]  Date:  08/10/2015   Name:  Donterrius Santucci   DOB:  07-05-1955   MRN:  [id number]  PCP:  No primary care provider on file.    History of Present Illness:  Jawan Chavarria is a 60 y.o. male patient who presents to [facility] for low back pain   Started 1-2 weeks ago, gradually worsening pain in the lower middle back that capped today.  It is a lateral pain that he denies it radiating to anywhere. It feels like stabbing. There is no numbness or tingling. He denies any kind of swelling or air or redness that he can note.  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   Pain is more prominent at night each evening pain has worsened. Aggravated with trying to get up. He notes great difficulty from getting up from floor to standing position. He is not exercising at this time. Physical incontinence can be with little stool or solid formed stools. He also feels an urgency    Patient Active Problem List   Diagnosis Date Noted  . Left ankle swelling 12/18/2014  . Left ankle pain 12/18/2014    Past Medical History  Diagnosis Date  . Hypertension     Past Surgical History  Procedure Laterality Date  . Hernia repair      Social History  Substance Use Topics  . Smoking status: Light Tobacco Smoker  . Smokeless tobacco: None  . Alcohol Use: 0.0 oz/week    0 Standard drinks or equivalent per week    Family History  Problem Relation Age of Onset  . Multiple sclerosis Mother   . Stroke Father     No Known Allergies  Medication list has been reviewed and updated.  Current Outpatient Prescriptions on File Prior to Visit  Medication Sig Dispense Refill  . diphenhydramine-acetaminophen (TYLENOL PM) 25-500 MG TABS Take 1 tablet by mouth at bedtime as  needed.    . hydrochlorothiazide (HYDRODIURIL) 25 MG tablet Take 1 tablet (25 mg total) by mouth daily. PATIENT NEEDS MED REFILL CHECK UP FOR ADDITIONAL REFILLS (Patient not taking: Reported on 08/10/2015) 30 tablet 0  . meloxicam (MOBIC) 15 MG tablet Take 0.5-1 tablets (7.5-15 mg total) by mouth daily. (Patient not taking: Reported on 08/10/2015) 30 tablet 0  . temazepam (RESTORIL) 30 MG capsule Take 1 capsule (30 mg total) by mouth at bedtime as needed for sleep. (Patient not taking: Reported on 08/10/2015) 30 capsule 2   No current facility-administered medications on file prior to visit.    ROS ROS otherwise unremarkable unless listed above.  Physical Examination: BP 118/84 mmHg  Pulse 51  Temp(Src) 98 F (36.7 C) (Oral)  Resp 18  Ht  (1.803 m)  Wt 250 lb (113.399 kg)  BMI 34.88 kg/m2  SpO2 98% Ideal Body Weight: Weight in (lb) to have BMI = 25: 178.9  Physical Exam  Constitutional: He is oriented to person, place, and time. He appears well-developed and well-nourished. No distress.  HENT:  Head: Normocephalic and atraumatic.  Eyes: Conjunctivae and EOM are normal. Pupils are equal, round, and reactive to light.  Cardiovascular: Normal rate.   Pulmonary/Chest: Effort normal. No respiratory distress. He has no decreased breath sounds. He has no wheezes. He  has no rhonchi.  Genitourinary:  Declines genitourinary exam. He also is declining prostate exam.  Musculoskeletal:       Right shoulder: He exhibits normal range of motion, no tenderness, no bony tenderness and no spasm.  In sitting position, forward flexion has some tenderness.  Normal lateral deviation as well as torso rotation.  There are no palpable muscle spasms appreciated at this time  Neurological: He is alert and oriented to person, place, and time.  Skin: Skin is warm and dry. He is not diaphoretic.  Psychiatric: He has a normal mood and affect. His behavior is normal.     Assessment and Plan: Theressa Millarddward  Poullard is a 60 y.o. male who is here today for low back pain. This appears to be a low back muscle strain. Likely due to his deconditioning and strenuous work. I have suggested that he do stretches. This will be preceded by heat. He will do the stretches 3 times a day and then directly ice. I've given him an anti-inflammatory. Advised of precautions and no use of other NSAIDs. I've also advised him to use a muscle relaxant at this time. We will give him some time from work for the next 5 days while he recuperates and stretches the back. I have discussed that he should strengthen his core to avoid any more back pain. His fecal incontinence appears to be quite intermittent however chronic. I have really encouraged him to get a colonoscopy as he is at that screening age. He'll contact me if he wishes to pursue.  Back pain, unspecified location - Plan: DG Lumbar Spine Complete, DG Sacrum/Coccyx, cyclobenzaprine (FLEXERIL) 5 MG tablet, meloxicam (MOBIC) 15 MG tablet  Trena PlattStephanie English, PA-C [facility] 4/26/20174:22 PM   Trena PlattStephanie English, PA-C [facility] 08/10/2015 10:34 AM

## 2015-09-26 DIAGNOSIS — K080 Exfoliation of teeth due to systemic causes: Secondary | ICD-10-CM | POA: Diagnosis not present

## 2016-04-18 DIAGNOSIS — K080 Exfoliation of teeth due to systemic causes: Secondary | ICD-10-CM | POA: Diagnosis not present

## 2016-11-14 ENCOUNTER — Inpatient Hospital Stay (HOSPITAL_COMMUNITY)
Admission: EM | Admit: 2016-11-14 | Discharge: 2016-11-19 | DRG: 065 | Disposition: A | Discharge status: Home Health | Payer: Federal, State, Local not specified - PPO | Attending: Neurology | Admitting: Neurology

## 2016-11-14 ENCOUNTER — Emergency Department (HOSPITAL_COMMUNITY): Payer: Federal, State, Local not specified - PPO

## 2016-11-14 DIAGNOSIS — I10 Essential (primary) hypertension: Secondary | ICD-10-CM | POA: Diagnosis present

## 2016-11-14 DIAGNOSIS — T502X5A Adverse effect of carbonic-anhydrase inhibitors, benzothiadiazides and other diuretics, initial encounter: Secondary | ICD-10-CM | POA: Diagnosis not present

## 2016-11-14 DIAGNOSIS — R29705 NIHSS score 5: Secondary | ICD-10-CM | POA: Diagnosis present

## 2016-11-14 DIAGNOSIS — E039 Hypothyroidism, unspecified: Secondary | ICD-10-CM | POA: Diagnosis not present

## 2016-11-14 DIAGNOSIS — Z9114 Patient's other noncompliance with medication regimen: Secondary | ICD-10-CM

## 2016-11-14 DIAGNOSIS — R29810 Facial weakness: Secondary | ICD-10-CM | POA: Diagnosis present

## 2016-11-14 DIAGNOSIS — Z823 Family history of stroke: Secondary | ICD-10-CM | POA: Diagnosis not present

## 2016-11-14 DIAGNOSIS — E669 Obesity, unspecified: Secondary | ICD-10-CM | POA: Diagnosis not present

## 2016-11-14 DIAGNOSIS — D72829 Elevated white blood cell count, unspecified: Secondary | ICD-10-CM

## 2016-11-14 DIAGNOSIS — E785 Hyperlipidemia, unspecified: Secondary | ICD-10-CM | POA: Diagnosis not present

## 2016-11-14 DIAGNOSIS — E860 Dehydration: Secondary | ICD-10-CM | POA: Diagnosis not present

## 2016-11-14 DIAGNOSIS — Z6838 Body mass index (BMI) 38.0-38.9, adult: Secondary | ICD-10-CM

## 2016-11-14 DIAGNOSIS — I6789 Other cerebrovascular disease: Secondary | ICD-10-CM | POA: Diagnosis not present

## 2016-11-14 DIAGNOSIS — I610 Nontraumatic intracerebral hemorrhage in hemisphere, subcortical: Secondary | ICD-10-CM | POA: Diagnosis not present

## 2016-11-14 DIAGNOSIS — G8194 Hemiplegia, unspecified affecting left nondominant side: Secondary | ICD-10-CM | POA: Diagnosis not present

## 2016-11-14 DIAGNOSIS — F17210 Nicotine dependence, cigarettes, uncomplicated: Secondary | ICD-10-CM | POA: Diagnosis present

## 2016-11-14 DIAGNOSIS — I6523 Occlusion and stenosis of bilateral carotid arteries: Secondary | ICD-10-CM | POA: Diagnosis not present

## 2016-11-14 DIAGNOSIS — R29818 Other symptoms and signs involving the nervous system: Secondary | ICD-10-CM | POA: Diagnosis not present

## 2016-11-14 DIAGNOSIS — F1210 Cannabis abuse, uncomplicated: Secondary | ICD-10-CM | POA: Diagnosis not present

## 2016-11-14 DIAGNOSIS — Z79899 Other long term (current) drug therapy: Secondary | ICD-10-CM | POA: Diagnosis not present

## 2016-11-14 DIAGNOSIS — R531 Weakness: Secondary | ICD-10-CM | POA: Diagnosis not present

## 2016-11-14 DIAGNOSIS — F17200 Nicotine dependence, unspecified, uncomplicated: Secondary | ICD-10-CM | POA: Diagnosis not present

## 2016-11-14 DIAGNOSIS — I619 Nontraumatic intracerebral hemorrhage, unspecified: Secondary | ICD-10-CM | POA: Diagnosis present

## 2016-11-14 DIAGNOSIS — R05 Cough: Secondary | ICD-10-CM | POA: Diagnosis not present

## 2016-11-14 DIAGNOSIS — I639 Cerebral infarction, unspecified: Secondary | ICD-10-CM | POA: Diagnosis not present

## 2016-11-14 LAB — DIFFERENTIAL
BASOS ABS: 0.1 10*3/uL (ref 0.0–0.1)
Basophils Relative: 0 %
EOS ABS: 0.3 10*3/uL (ref 0.0–0.7)
Eosinophils Relative: 2 %
LYMPHS ABS: 3 10*3/uL (ref 0.7–4.0)
LYMPHS PCT: 20 %
MONOS PCT: 7 %
Monocytes Absolute: 1.1 10*3/uL — ABNORMAL HIGH (ref 0.1–1.0)
NEUTROS ABS: 10.8 10*3/uL — AB (ref 1.7–7.7)
Neutrophils Relative %: 71 %

## 2016-11-14 LAB — CBC
HEMATOCRIT: 49.9 % (ref 39.0–52.0)
HEMOGLOBIN: 17.8 g/dL — AB (ref 13.0–17.0)
MCH: 32.5 pg (ref 26.0–34.0)
MCHC: 35.7 g/dL (ref 30.0–36.0)
MCV: 91.1 fL (ref 78.0–100.0)
Platelets: 242 10*3/uL (ref 150–400)
RBC: 5.48 MIL/uL (ref 4.22–5.81)
RDW: 13.9 % (ref 11.5–15.5)
WBC: 15.2 10*3/uL — ABNORMAL HIGH (ref 4.0–10.5)

## 2016-11-14 LAB — COMPREHENSIVE METABOLIC PANEL
ALBUMIN: 4.3 g/dL (ref 3.5–5.0)
ALK PHOS: 69 U/L (ref 38–126)
ALT: 20 U/L (ref 17–63)
AST: 25 U/L (ref 15–41)
Anion gap: 11 (ref 5–15)
BILIRUBIN TOTAL: 0.8 mg/dL (ref 0.3–1.2)
BUN: 10 mg/dL (ref 6–20)
CALCIUM: 9.2 mg/dL (ref 8.9–10.3)
CHLORIDE: 103 mmol/L (ref 101–111)
CO2: 20 mmol/L — ABNORMAL LOW (ref 22–32)
CREATININE: 1.07 mg/dL (ref 0.61–1.24)
GFR calc Af Amer: >60 mL/min (ref >60)
Glucose, Bld: 123 mg/dL — ABNORMAL HIGH (ref 65–99)
POTASSIUM: 3.7 mmol/L (ref 3.5–5.1)
Sodium: 134 mmol/L — ABNORMAL LOW (ref 135–145)
Total Protein: 8 g/dL (ref 6.5–8.1)

## 2016-11-14 LAB — I-STAT CHEM 8, ED
BUN: 12 mg/dL (ref 6–20)
CREATININE: 1 mg/dL (ref 0.61–1.24)
Calcium, Ion: 1.1 mmol/L — ABNORMAL LOW (ref 1.15–1.40)
Chloride: 105 mmol/L (ref 101–111)
GLUCOSE: 129 mg/dL — AB (ref 65–99)
HCT: 54 % — ABNORMAL HIGH (ref 39.0–52.0)
HEMOGLOBIN: 18.4 g/dL — AB (ref 13.0–17.0)
POTASSIUM: 3.7 mmol/L (ref 3.5–5.1)
Sodium: 139 mmol/L (ref 135–145)
TCO2: 19 mmol/L (ref 0–100)

## 2016-11-14 LAB — PROTIME-INR
INR: 0.99
Prothrombin Time: 13.1 seconds (ref 11.4–15.2)

## 2016-11-14 LAB — APTT: APTT: 28 s (ref 24–36)

## 2016-11-14 LAB — I-STAT TROPONIN, ED: TROPONIN I, POC: 0.01 ng/mL (ref 0.00–0.08)

## 2016-11-14 LAB — CBG MONITORING, ED: Glucose-Capillary: 138 mg/dL — ABNORMAL HIGH (ref 65–99)

## 2016-11-14 MED ORDER — PANTOPRAZOLE SODIUM 40 MG IV SOLR
40.0000 mg | Freq: Every day | INTRAVENOUS | Status: DC
  Administered 2016-11-14: 40 mg via INTRAVENOUS
  Filled 2016-11-14: qty 40

## 2016-11-14 MED ORDER — LABETALOL HCL 5 MG/ML IV SOLN
20.0000 mg | Freq: Once | INTRAVENOUS | Status: AC
  Administered 2016-11-14: 20 mg via INTRAVENOUS

## 2016-11-14 MED ORDER — SENNOSIDES-DOCUSATE SODIUM 8.6-50 MG PO TABS
1.0000 | ORAL_TABLET | Freq: Two times a day (BID) | ORAL | Status: DC
  Administered 2016-11-14 – 2016-11-19 (×5): 1 via ORAL
  Filled 2016-11-14 (×10): qty 1

## 2016-11-14 MED ORDER — STROKE: EARLY STAGES OF RECOVERY BOOK
Freq: Once | Status: AC
  Administered 2016-11-14: 23:00:00
  Filled 2016-11-14: qty 1

## 2016-11-14 MED ORDER — NICARDIPINE HCL IN NACL 20-0.86 MG/200ML-% IV SOLN
0.0000 mg/h | INTRAVENOUS | Status: DC
  Administered 2016-11-14: 10 mg/h via INTRAVENOUS
  Administered 2016-11-14: 5 mg/h via INTRAVENOUS
  Administered 2016-11-15 (×2): 10 mg/h via INTRAVENOUS
  Administered 2016-11-15: 5 mg/h via INTRAVENOUS
  Administered 2016-11-15 (×2): 10 mg/h via INTRAVENOUS
  Administered 2016-11-15 (×2): 7.5 mg/h via INTRAVENOUS
  Administered 2016-11-15: 5 mg/h via INTRAVENOUS
  Administered 2016-11-16 (×2): 6 mg/h via INTRAVENOUS
  Administered 2016-11-16: 7.5 mg/h via INTRAVENOUS
  Filled 2016-11-14: qty 200
  Filled 2016-11-14: qty 400
  Filled 2016-11-14 (×9): qty 200

## 2016-11-14 MED ORDER — NICARDIPINE HCL IN NACL 20-0.86 MG/200ML-% IV SOLN
INTRAVENOUS | Status: AC
  Filled 2016-11-14: qty 200

## 2016-11-14 MED ORDER — ACETAMINOPHEN 650 MG RE SUPP: 650.0000 mg | RECTAL | Status: DC | PRN

## 2016-11-14 MED ORDER — ACETAMINOPHEN 160 MG/5ML PO SOLN: 650.0000 mg | ORAL | Status: DC | PRN

## 2016-11-14 MED ORDER — ACETAMINOPHEN 325 MG PO TABS: 650.0000 mg | ORAL_TABLET | ORAL | Status: DC | PRN

## 2016-11-14 NOTE — Progress Notes (Signed)
Fred Davis is a 61 y.o. male with a history of hypertension, he was "taken off" his hypertensive medication "a while ago" unsure how long ago they were stopped or as to why they were stopped. He was watching tv around 1930 when he noticed Left side weakness, he attempted to turn off the lights when he "knocked everything off the table" he then called 911. He was brought to ED as a code stroke via [facility]  NIH 4, LKW 1930, CBG 138 Pt to be admitted to neuro

## 2016-11-14 NOTE — ED Notes (Signed)
Leslie from RR to chart NIH/Stroke log.

## 2016-11-14 NOTE — ED Notes (Signed)
Pt states that he had a shot of liquor and a "hit of marijuana" prior to the event.

## 2016-11-14 NOTE — ED Provider Notes (Signed)
[facility]-EMERGENCY DEPT Provider Note   CSN: [id number] Arrival date & time: 11/14/16  2055     History   Chief Complaint No chief complaint on file.   HPI Fred Davis is a 61 y.o. male.  The history is provided by the patient, medical records and the EMS personnel.    61 y.o. M with hx of HTN not currently on meds, presenting to the ED via EMS as a code stroke.  Patient reports he was sitting on his couch around 730PM when he started having some weakness on his left side, mostly the arm, and noticed the left side of his face felt weird.  He called EMS and they arrived around 8PM, code stroke activated in the field.  On arrival here patient does have a mild left sided facial droop and some altered sensation of the left arm.  No changes in speech.  No reported difficulty walking.  No recent illness.  Came off BP meds about 9 months ago.  Not currently on anticoagulation.  Past Medical History:  Diagnosis Date  . Hypertension     Patient Active Problem List   Diagnosis Date Noted  . Left ankle swelling 12/18/2014  . Left ankle pain 12/18/2014    Past Surgical History:  Procedure Laterality Date  . HERNIA REPAIR         Home Medications    Prior to Admission medications   Medication Sig Start Date End Date Taking? Authorizing Provider  cyclobenzaprine (FLEXERIL) 5 MG tablet Take 1 tablet (5 mg total) by mouth 3 (three) times daily as needed for muscle spasms. 08/10/15   Trena PlattEnglish, Stephanie D, PA  diphenhydramine-acetaminophen (TYLENOL PM) 25-500 MG TABS Take 1 tablet by mouth at bedtime as needed.    [provider]  hydrochlorothiazide (HYDRODIURIL) 25 MG tablet Take 1 tablet (25 mg total) by mouth daily. PATIENT NEEDS MED REFILL CHECK UP FOR ADDITIONAL REFILLS Patient not taking: Reported on 08/10/2015 03/29/15   Collene Gobbleaub, Steven A, MD  meloxicam (MOBIC) 15 MG tablet Take 0.5-1 tablets (7.5-15 mg total) by mouth daily. 08/10/15   Trena PlattEnglish, Stephanie D, PA  temazepam  (RESTORIL) 30 MG capsule Take 1 capsule (30 mg total) by mouth at bedtime as needed for sleep. 08/10/15   Garnetta BuddyEnglish, Stephanie D, PA    Family History Family History  Problem Relation Age of Onset  . Multiple sclerosis Mother   . Stroke Father     Social History Social History  Substance Use Topics  . Smoking status: Light Tobacco Smoker  . Smokeless tobacco: Not on file  . Alcohol use 0.0 oz/week     Allergies   Patient has no known allergies.   Review of Systems Review of Systems  Neurological: Positive for facial asymmetry and weakness.  All other systems reviewed and are negative.    Physical Exam Updated Vital Signs BP (!) 152/96   Pulse 88   Temp 98 F (36.7 C) (Oral)   Resp 14   Ht 5\' 11"  (1.803 m)   Wt 126.7 kg (279 lb 5.2 oz)   SpO2 95%   BMI 38.96 kg/m   Physical Exam  Constitutional: He is oriented to person, place, and time. He appears well-developed and well-nourished.  HENT:  Head: Normocephalic and atraumatic.  Mouth/Throat: Oropharynx is clear and moist.  Eyes: Pupils are equal, round, and reactive to light. Conjunctivae and EOM are normal.  Neck: Normal range of motion.  Cardiovascular: Normal rate, regular rhythm and normal heart sounds.  Pulmonary/Chest: Effort normal and breath sounds normal. No respiratory distress. He has no wheezes.  Abdominal: Soft. Bowel sounds are normal. There is no tenderness. There is no rebound.  Musculoskeletal: Normal range of motion.  Neurological: He is alert and oriented to person, place, and time.  AAOx3, answering questions and following commands appropriately; equal strength UE and LE bilaterally; CN grossly intact; moves all extremities appropriately without ataxia; decreased sensation to sharp and dull touch in the left face and arm; left facial droop noted, speech is clear and goal oriented  Skin: Skin is warm and dry.  Psychiatric: He has a normal mood and affect.  Nursing note and vitals  reviewed.    ED Treatments / Results  Labs (all labs ordered are listed, but only abnormal results are displayed) Labs Reviewed  CBG MONITORING, ED - Abnormal; Notable for the following:       Result Value   Glucose-Capillary 138 (*)    All other components within normal limits  I-STAT CHEM 8, ED - Abnormal; Notable for the following:    Glucose, Bld 129 (*)    Calcium, Ion 1.10 (*)    Hemoglobin 18.4 (*)    HCT 54.0 (*)    All other components within normal limits  PROTIME-INR  APTT  CBC  DIFFERENTIAL  COMPREHENSIVE METABOLIC PANEL  I-STAT TROPONIN, ED    EKG  EKG Interpretation None       Radiology Ct Head Code Stroke W/o Cm  Result Date: 11/14/2016 CLINICAL DATA:  Code stroke. Initial evaluation for acute left-sided weakness, facial droop. EXAM: CT HEAD WITHOUT CONTRAST TECHNIQUE: Contiguous axial images were obtained from the base of the skull through the vertex without intravenous contrast. COMPARISON:  None. FINDINGS: Brain: There is an acute intraparenchymal hematoma measuring 2.5 x 1.4 x 2.3 cm centered at the right lentiform nucleus (series 3, image 16, estimated volume 4.0 mL). Mild localized vasogenic edema without significant mass effect. No midline shift. Basilar cisterns remain patent. No intraventricular extension of hemorrhage. No hydrocephalus. No other acute intracranial hemorrhage. No evidence for acute large vessel territory infarct. No mass lesion. No extra-axial fluid collection. Underlying chronic microvascular ischemic changes noted. Vascular: No hyperdense vessel. Scattered vascular calcifications noted within the carotid siphons. Skull: Scalp soft tissues and calvarium within normal limits. Sinuses/Orbits: Globes orbital soft tissues within normal limits. Paranasal sinuses are clear. No mastoid effusion. IMPRESSION: 1. 2.5 x 1.4 x 2.3 cm (estimated volume 4.0 mL) acute intraparenchymal hemorrhage centered at the right lentiform nucleus. No significant  regional mass effect. 2. Underlying chronic microvascular ischemic disease. Critical Value/emergent results were called by telephone at the time of interpretation on 11/14/2016 at 9:12 pm to Dr. Amada JupiterKirkpatrick , who verbally acknowledged these results. Electronically Signed   By: Rise MuBenjamin  McClintock M.D.   On: 11/14/2016 21:20    Procedures Procedures (including critical care time)  CRITICAL CARE Performed by: Garlon HatchetSANDERS, Milaya Hora M   Total critical care time: 45 minutes  Critical care time was exclusive of separately billable procedures and treating other patients.  Critical care was necessary to treat or prevent imminent or life-threatening deterioration.  Critical care was time spent personally by me on the following activities: development of treatment plan with patient and/or surrogate as well as nursing, discussions with consultants, evaluation of patient's response to treatment, examination of patient, obtaining history from patient or surrogate, ordering and performing treatments and interventions, ordering and review of laboratory studies, ordering and review of radiographic studies, pulse oximetry and re-evaluation of  patient's condition.   Medications Ordered in ED Medications  nicardipine (CARDENE) 20mg  in 0.86% saline IV infusion (0.1 mg/ml) (7.5 mg/hr Intravenous Rate/Dose Change 11/14/16 2124)  labetalol (NORMODYNE,TRANDATE) injection 20 mg (20 mg Intravenous Given 11/14/16 2110)     Initial Impression / Assessment and Plan / ED Course  I have reviewed the triage vital signs and the nursing notes.  Pertinent labs & imaging results that were available during my care of the patient were reviewed by me and considered in my medical decision making (see chart for details).  61 year old male presenting to the ED as a code stroke. Last known well around 7:30 PM, EMS arrived at 8 PM and transported here. On arrival patient does have a left-sided facial droop and some altered sensation of  left side of his face and left arm. His speech is clear and goal oriented. Protecting his airway. Patient taken directly to CT scan which revealed intraparenchymal hemorrhage, no midline shift or mass effect.  Patient mildly hypertensive, started on Cardene drip. He will be admitted to neuro ICU.  Final Clinical Impressions(s) / ED Diagnoses   Final diagnoses:  Hemorrhagic stroke [facility])    New Prescriptions New Prescriptions   No medications on file     Oletha Blend 11/14/16 2137    Lavera Guise, MD 11/14/16 [phone]

## 2016-11-14 NOTE — H&P (Addendum)
Neurology H&P  CC: Left-sided weakness   History is obtained from: Patient  HPI: Fred Davis is a 61 y.o. male with a history of hypertension who has not been on hypertensive medications after he was "taken off Them" (patient not certain why) a while ago (patient not sure how long ago). He was watching TV when around 7:30 PM he noticed that his left side wasn't working quite right. He called 911 subsequently when things were not getting better. He was brought in as a code stroke and the initial head CT revealed a right basal ganglia hemorrhage.  LKW: 7:30 PM tpa given?: no, out of window ICH Score: 0 NIHSS: 5  ROS: A 14 point ROS was performed and is negative except as noted in the HPI.  Past Medical History:  Diagnosis Date  . Hypertension      Family History  Problem Relation Age of Onset  . Multiple sclerosis Mother   . Stroke Father      Social History:  reports that he has been smoking.  He does not have any smokeless tobacco history on file. He reports that he drinks alcohol. He reports that he does not use drugs.   Exam: Current vital signs: BP (!) 152/96   Pulse 88   Temp 98 F (36.7 C) (Oral)   Resp 14   Ht 5\' 11"  (1.803 m)   Wt 126.7 kg (279 lb 5.2 oz)   SpO2 95%   BMI 38.96 kg/m  Vital signs in last 24 hours: Temp:  [98 F (36.7 C)] 98 F (36.7 C) (08/01 2129) Pulse Rate:  [85-88] 88 (08/01 2130) Resp:  [13-16] 14 (08/01 2130) BP: (152-173)/(96-103) 152/96 (08/01 2130) SpO2:  [92 %-98 %] 95 % (08/01 2130) Weight:  [126.7 kg (279 lb 5.2 oz)] 126.7 kg (279 lb 5.2 oz) (08/01 2122)  Physical Exam  Constitutional: Appears well-developed and well-nourished.  Psych: Affect appropriate to situation Eyes: No scleral injection HENT: No OP obstrucion Head: Normocephalic.  Cardiovascular: Normal rate and regular rhythm.  Respiratory: Effort normal and breath sounds normal to anterior ascultation GI: Soft.  No distension. There is no tenderness.  Skin:  WDI  Neuro: Mental Status: Patient is awake, alert, oriented to person, place, month, year, and situation. Patient is able to give a clear and coherent history. No signs of aphasia or neglect He has a mild dysarthria Cranial Nerves: II: Visual Fields are full. Pupils are equal, round, and reactive to light.   III,IV, VI: EOMI without ptosis or diploplia.  V: Facial sensation is Mildly decreased on the left VII: Facial movement is weak on the left VIII: hearing is intact to voice X: Uvula elevates symmetrically XI: Shoulder shrug is symmetric. XII: tongue is midline without atrophy or fasciculations.  Motor: Tone is normal. Bulk is normal. He has mild 4+/5 strength in the left arm and leg with drift in both of these extremities Sensory: Sensation is symmetric to light touch and temperature in the arms and legs. Cerebellar: FNF and HKS are intact bilaterally   I have reviewed labs in epic and the results pertinent to this consultation are: CMP-unremarkable CBC-mild leukocytosis  I have reviewed the images obtained: CT head-lentiform nucleus hemorrhage on the right  Impression: 61 year old male with likely hypertensive hemorrhage. He will need to be admitted to the ICU for monitoring overnight.  Recommendations: 1) Admit to ICU 2) no antiplatelets or anticoagulants 3) blood pressure control with goal systolic 120 - 140 4) Frequent neuro checks 5)  If symptoms worsen or there is decreased mental status, repeat stat head CT 6) PT,OT,ST 7) cardene for BP control   This patient is critically ill and at significant risk of neurological worsening, death and care requires constant monitoring of vital signs, hemodynamics,respiratory and cardiac monitoring, neurological assessment, discussion with family, other specialists and medical decision making of high complexity. I spent 45 minutes of neurocritical care time  in the care of  this patient.  Ritta SlotMcNeill Miski Feldpausch, MD [facility] [phone]  If 7pm- 7am, please page neurology on call as listed in AMION. 11/14/2016  9:36 PM

## 2016-11-15 ENCOUNTER — Inpatient Hospital Stay (HOSPITAL_COMMUNITY): Payer: Federal, State, Local not specified - PPO

## 2016-11-15 ENCOUNTER — Encounter (HOSPITAL_COMMUNITY): Payer: Self-pay

## 2016-11-15 DIAGNOSIS — I10 Essential (primary) hypertension: Secondary | ICD-10-CM

## 2016-11-15 DIAGNOSIS — I610 Nontraumatic intracerebral hemorrhage in hemisphere, subcortical: Secondary | ICD-10-CM

## 2016-11-15 DIAGNOSIS — F17200 Nicotine dependence, unspecified, uncomplicated: Secondary | ICD-10-CM

## 2016-11-15 DIAGNOSIS — F1210 Cannabis abuse, uncomplicated: Secondary | ICD-10-CM

## 2016-11-15 LAB — RAPID URINE DRUG SCREEN, HOSP PERFORMED
AMPHETAMINES: NOT DETECTED
BARBITURATES: NOT DETECTED
BENZODIAZEPINES: NOT DETECTED
COCAINE: NOT DETECTED
Opiates: NOT DETECTED
TETRAHYDROCANNABINOL: POSITIVE — AB

## 2016-11-15 LAB — CBC
HCT: 48.7 % (ref 39.0–52.0)
Hemoglobin: 17 g/dL (ref 13.0–17.0)
MCH: 32 pg (ref 26.0–34.0)
MCHC: 34.9 g/dL (ref 30.0–36.0)
MCV: 91.7 fL (ref 78.0–100.0)
PLATELETS: 237 10*3/uL (ref 150–400)
RBC: 5.31 MIL/uL (ref 4.22–5.81)
RDW: 14 % (ref 11.5–15.5)
WBC: 14.2 10*3/uL — ABNORMAL HIGH (ref 4.0–10.5)

## 2016-11-15 LAB — BASIC METABOLIC PANEL
Anion gap: 9 (ref 5–15)
BUN: 11 mg/dL (ref 6–20)
CHLORIDE: 105 mmol/L (ref 101–111)
CO2: 23 mmol/L (ref 22–32)
CREATININE: 1.01 mg/dL (ref 0.61–1.24)
Calcium: 8.9 mg/dL (ref 8.9–10.3)
GFR calc Af Amer: >60 mL/min (ref >60)
GLUCOSE: 107 mg/dL — AB (ref 65–99)
POTASSIUM: 4 mmol/L (ref 3.5–5.1)
Sodium: 137 mmol/L (ref 135–145)

## 2016-11-15 LAB — ECHOCARDIOGRAM COMPLETE
Height: 71 in
WEIGHTICAEL: 4469.17 [oz_av]

## 2016-11-15 LAB — MRSA PCR SCREENING: MRSA BY PCR: NEGATIVE

## 2016-11-15 LAB — LIPID PANEL
CHOL/HDL RATIO: 3.5 ratio
Cholesterol: 154 mg/dL (ref 0–200)
HDL: 44 mg/dL (ref >40)
LDL Cholesterol: 98 mg/dL (ref 0–99)
Triglycerides: 61 mg/dL (ref <150)
VLDL: 12 mg/dL (ref 0–40)

## 2016-11-15 LAB — VITAMIN B12: Vitamin B-12: 479 pg/mL (ref 180–914)

## 2016-11-15 LAB — RPR: RPR Ser Ql: NONREACTIVE

## 2016-11-15 LAB — HIV ANTIBODY (ROUTINE TESTING W REFLEX): HIV Screen 4th Generation wRfx: NONREACTIVE

## 2016-11-15 LAB — T4, FREE: Free T4: 0.49 ng/dL — ABNORMAL LOW (ref 0.61–1.12)

## 2016-11-15 LAB — TSH: TSH: 15.134 u[IU]/mL — ABNORMAL HIGH (ref 0.350–4.500)

## 2016-11-15 MED ORDER — IOPAMIDOL (ISOVUE-370) INJECTION 76%
INTRAVENOUS | Status: AC
  Administered 2016-11-15: 50 mL
  Filled 2016-11-15: qty 50

## 2016-11-15 MED ORDER — LISINOPRIL 20 MG PO TABS
20.0000 mg | ORAL_TABLET | Freq: Every day | ORAL | Status: DC
  Administered 2016-11-15: 20 mg via ORAL
  Filled 2016-11-15: qty 1

## 2016-11-15 MED ORDER — HYDRALAZINE HCL 20 MG/ML IJ SOLN
5.0000 mg | INTRAMUSCULAR | Status: DC | PRN
Start: 2016-11-15 — End: 2016-11-16
  Administered 2016-11-16: 5 mg via INTRAVENOUS
  Filled 2016-11-15: qty 1

## 2016-11-15 MED ORDER — AMLODIPINE BESYLATE 10 MG PO TABS
10.0000 mg | ORAL_TABLET | Freq: Every day | ORAL | Status: DC
  Administered 2016-11-16: 10 mg via ORAL
  Filled 2016-11-15: qty 1

## 2016-11-15 MED ORDER — LISINOPRIL 20 MG PO TABS
20.0000 mg | ORAL_TABLET | Freq: Two times a day (BID) | ORAL | Status: DC
Start: 2016-11-16 — End: 2016-11-19
  Administered 2016-11-16 – 2016-11-19 (×7): 20 mg via ORAL
  Filled 2016-11-15 (×7): qty 1

## 2016-11-15 MED ORDER — HYDROCHLOROTHIAZIDE 25 MG PO TABS
25.0000 mg | ORAL_TABLET | Freq: Every day | ORAL | Status: DC
  Administered 2016-11-15 – 2016-11-16 (×2): 25 mg via ORAL
  Filled 2016-11-15 (×2): qty 1

## 2016-11-15 MED ORDER — LISINOPRIL 10 MG PO TABS: 10.0000 mg | ORAL_TABLET | Freq: Two times a day (BID) | ORAL | Status: DC

## 2016-11-15 MED ORDER — LABETALOL HCL 5 MG/ML IV SOLN
10.0000 mg | INTRAVENOUS | Status: DC | PRN
  Administered 2016-11-15: 10 mg via INTRAVENOUS
  Filled 2016-11-15 (×2): qty 4

## 2016-11-15 MED ORDER — PANTOPRAZOLE SODIUM 40 MG PO TBEC
40.0000 mg | DELAYED_RELEASE_TABLET | Freq: Every day | ORAL | Status: DC
  Administered 2016-11-15 – 2016-11-19 (×4): 40 mg via ORAL
  Filled 2016-11-15 (×5): qty 1

## 2016-11-15 NOTE — Progress Notes (Signed)
  Echocardiogram 2D Echocardiogram has been performed.  Arvil ChacoFoster, Edmond Ginsberg 11/15/2016, 1:43 PM

## 2016-11-15 NOTE — Progress Notes (Signed)
PT Cancellation Note  Patient Details Name: Fred Davis MRN: [id number] DOB: 01/20/1956   Cancelled Treatment:    Reason Eval/Treat Not Completed: Patient not medically ready. Pt currently on bedrest. Will await increased activity orders prior to initiating PT eval. Thanks   Marylynn PearsonLaura D Niyana Chesbro 11/15/2016, 7:32 AM   Conni SlipperLaura Saheed Carrington, PT, DPT Acute Rehabilitation Services Pager: [phone]

## 2016-11-15 NOTE — Progress Notes (Signed)
STROKE TEAM PROGRESS NOTE   HISTORY OF PRESENT ILLNESS (per record)  Fred Davis is a 61 y.o. male with a history of hypertension, tobacco abuse, occasional marijuana use who has not been on hypertensive medications after he was "taken off them" (patient not certain why) a while ago (patient not sure how long ago). He was watching TV when around 7:30 PM he noticed that his left side wasn't working quite right. He called 911 subsequently when things were not getting better. He was brought in as a code stroke and the initial head CT revealed a right basal ganglia hemorrhage.  LKW: 7:30 PM ICH Score: 0 NIHSS: 5  Patient was not administered IV t-PA secondary to arriving outside of the treatment window. He was admitted to the neuro ICU for further evaluation and treatment.   SUBJECTIVE (INTERVAL HISTORY) His nurse is at the bedsie.  No acute events overnight. Pt understands that this happened because he was not taking his BP meds and still smoking.   OBJECTIVE Temp:  [97.6 F (36.4 C)-98.9 F (37.2 C)] 98.6 F (37 C) (08/02 1200) Pulse Rate:  [29-122] 73 (08/02 1500) Cardiac Rhythm: Normal sinus rhythm (08/02 1200) Resp:  [10-28] 17 (08/02 1500) BP: (94-194)/(50-119) 152/87 (08/02 1539) SpO2:  [89 %-100 %] 96 % (08/02 1500) Weight:  [279 lb 5.2 oz (126.7 kg)] 279 lb 5.2 oz (126.7 kg) (08/01 2122)  CBC:   Recent Labs Lab 11/14/16 2100 11/14/16 2103 11/15/16 0653  WBC 15.2*  --  14.2*  NEUTROABS 10.8*  --   --   HGB 17.8* 18.4* 17.0  HCT 49.9 54.0* 48.7  MCV 91.1  --  91.7  PLT 242  --  237    Basic Metabolic Panel:   Recent Labs Lab 11/14/16 2100 11/14/16 2103 11/15/16 0653  NA 134* 139 137  K 3.7 3.7 4.0  CL 103 105 105  CO2 20*  --  23  GLUCOSE 123* 129* 107*  BUN 10 12 11   CREATININE 1.07 1.00 1.01  CALCIUM 9.2  --  8.9    Lipid Panel:     Component Value Date/Time   CHOL 154 11/15/2016 0653   TRIG 61 11/15/2016 0653   HDL 44 11/15/2016 0653    CHOLHDL 3.5 11/15/2016 0653   VLDL 12 11/15/2016 0653   LDLCALC 98 11/15/2016 0653   HgbA1c: No results found for: HGBA1C Urine Drug Screen: No results found for: LABOPIA, COCAINSCRNUR, LABBENZ, AMPHETMU, THCU, LABBARB  Alcohol Level No results found for: [facility]  IMAGING I have personally reviewed the radiological images below and agree with the radiology interpretations.  Ct Angio Head and neck W Or Wo Contrast 11/15/2016 IMPRESSION: 1. No visible vascular abnormality to explain the patient's hemorrhage. 2. Bilateral vertebral origin atherosclerotic stenosis, high-grade on the right and mild moderate on the left. 3. Cervical carotid and intracranial atherosclerosis without significant stenosis.   Ct Head Code Stroke W/o Cm  Result Date: 11/14/2016 CLINICAL DATA:  Code stroke. Initial evaluation for acute left-sided weakness, facial droop. EXAM: CT HEAD WITHOUT CONTRAST TECHNIQUE: Contiguous axial images were obtained from the base of the skull through the vertex without intravenous contrast. COMPARISON:  None. FINDINGS: Brain: There is an acute intraparenchymal hematoma measuring 2.5 x 1.4 x 2.3 cm centered at the right lentiform nucleus (series 3, image 16, estimated volume 4.0 mL). Mild localized vasogenic edema without significant mass effect. No midline shift. Basilar cisterns remain patent. No intraventricular extension of hemorrhage. No hydrocephalus. No other acute intracranial  hemorrhage. No evidence for acute large vessel territory infarct. No mass lesion. No extra-axial fluid collection. Underlying chronic microvascular ischemic changes noted. Vascular: No hyperdense vessel. Scattered vascular calcifications noted within the carotid siphons. Skull: Scalp soft tissues and calvarium within normal limits. Sinuses/Orbits: Globes orbital soft tissues within normal limits. Paranasal sinuses are clear. No mastoid effusion. IMPRESSION: 1. 2.5 x 1.4 x 2.3 cm (estimated volume 4.0 mL) acute  intraparenchymal hemorrhage centered at the right lentiform nucleus. No significant regional mass effect. 2. Underlying chronic microvascular ischemic disease. Critical Value/emergent results were called by telephone at the time of interpretation on 11/14/2016 at 9:12 pm to Dr. Amada JupiterKirkpatrick , who verbally acknowledged these results. Electronically Signed   By: Rise MuBenjamin  McClintock M.D.   On: 11/14/2016 21:20   2D Echo 11/15/2016 pending   PHYSICAL EXAM  Temp:  [97.6 F (36.4 C)-98.9 F (37.2 C)] 97.9 F (36.6 C) (08/02 1600) Pulse Rate:  [29-122] 73 (08/02 1500) Resp:  [10-28] 17 (08/02 1500) BP: (94-194)/(50-119) 152/87 (08/02 1539) SpO2:  [89 %-100 %] 96 % (08/02 1500) Weight:  [279 lb 5.2 oz (126.7 kg)] 279 lb 5.2 oz (126.7 kg) (08/01 2122)  General - Well nourished, well developed, in no apparent distress.  Ophthalmologic - Sharp disc margins OU.   Cardiovascular - Regular rate and rhythm.  Mental Status -  Level of arousal and orientation to time, place, and person were intact. Language including expression, naming, repetition, comprehension was assessed and found intact. Fund of Knowledge was assessed and was intact.  Cranial Nerves II - XII - II - Visual field intact OU. III, IV, VI - Extraocular movements intact. V - Facial sensation intact bilaterally. VII - right mild facial droop. VIII - Hearing & vestibular intact bilaterally. X - Palate elevates symmetrically. XI - Chin turning & shoulder shrug intact bilaterally. XII - Tongue protrusion intact.  Motor Strength - The patient's strength was normal in all extremities except RUE 5-/5 with pronator drift.  Bulk was normal and fasciculations were absent.   Motor Tone - Muscle tone was assessed at the neck and appendages and was normal.  Reflexes - The patient's reflexes were 1+ in all extremities and he had no pathological reflexes.  Sensory - Light touch, temperature/pinprick were assessed and were symmetrical.     Coordination - The patient had normal movements in the hands with no ataxia or dysmetria.  Tremor was absent.  Gait and Station - deferred   ASSESSMENT/PLAN Fred Davis is a 61 y.o. male with history of HTN, smoking, marijuana presenting with left sided weakness. He did not receive IV t-PA due to intraparenchymal hemorrhage .   ICH:  right acute ICH at right external capsule  Resultant  Right facial droop, right UE mild weakness   CT head - small right external capsule ICH  CTA head and neck - stable hematoma, b/l VA stenosis, no aneurysm or AVM   2D Echo  pending  LDL 98  HgbA1c pending  SCD for VTE prophylaxis Diet heart healthy/carb modified Room service appropriate? Yes; Fluid consistency: Thin  No antithrombotic prior to admission, now on No antithrombotic  Patient counseled to be compliant with his antithrombotic medications  Ongoing aggressive stroke risk factor management  Therapy recommendations:  pending  Disposition:  pending  Hypertension  Noncompliance with BP meds a thome  Stable on cardene  Goal BP <140 systolic  Resume home BP meds HCTZ, also add lisinopril  Taper off cardene as able  Long-term BP goal  normotensive  Hyperlipidemia  LDL 98, goal < 70  No need to initiate statin at this time  Hypothyroidism:   TSH of 15  checked free T4  will not recommend starting any meds on discharge. Lab values need to be repeated 6 weeks after discharge to determine whether he has overt hypothyroidism, subclinical, or if he experiences any symptoms   Diabetes  HgbA1c pending, goal < 7.0  CBG monitoring  Tobacco abuse  Current smoker  Smoking cessation counseling provided  Pt is willing to quit  Other Stroke Risk Factors  Marijuana smoker - cessation counseling provided  ETOH use, advised to drink no more than 2 drink(s) a day  Obesity, Body mass index is 38.96 kg/m., recommend weight loss, diet and exercise as appropriate    Family hx stroke (father)  Other Active Problems  Mild leukocytosis. Likely reactive.   Hospital day # 1  Signed Deneise LeverParth Saraiya MD IMTS Stroke Neurology Team   I, the attending vascular neurologist, have personally obtained a history, examined the patient, evaluated laboratory data, individually viewed imaging studies and agree with radiology interpretations. Together with Dr. Johnny BridgeSaraiya, we formulated the assessment and plan of care which reflects our mutual decision.  I have made any additions or clarifications directly to the above note and agree with the findings and plan as currently documented.   Pt admitted with left external capsule ICH likely due to uncontrolled HTN and smoker. Currently on cardene, will try to wean off. Add po BP meds. Passed swallow. Pending PT/OT. Smoking cessation counseling provided.   Marvel PlanJindong Toa Mia, MD PhD Stroke Neurology 11/15/2016 5:03 PM  This patient is critically ill due to ICH, HTN, hyperglycemia, smoker and at significant risk of neurological worsening, death form hematoma expansion, stroke, DKA, hypertensive emergency. This patient's care requires constant monitoring of vital signs, hemodynamics, respiratory and cardiac monitoring, review of multiple databases, neurological assessment, discussion with family, other specialists and medical decision making of high complexity. I spent 40 minutes of neurocritical care time in the care of this patient.        To contact Stroke Continuity provider, please refer to [email or web address]. After hours, contact General Neurology

## 2016-11-15 NOTE — Evaluation (Signed)
Speech Language Pathology Evaluation Patient Details Name: Fred Davis MRN: [id number] DOB: 03/06/1956 Today's Date: 11/15/2016 Time: [id number] SLP Time Calculation (min) (ACUTE ONLY): 16 min  Problem List:  Patient Active Problem List   Diagnosis Date Noted  . ICH (intracerebral hemorrhage) (HCC) 11/14/2016  . Left ankle swelling 12/18/2014  . Left ankle pain 12/18/2014   Past Medical History:  Past Medical History:  Diagnosis Date  . Hypertension    Past Surgical History:  Past Surgical History:  Procedure Laterality Date  . HERNIA REPAIR     HPI:  Ptis a 61 y.o.malewith history of HTN, smoking, and marijuanause, presenting with left sided weakness. CT showed a R basal ganglia hemorrhage.   Assessment / Plan / Recommendation Clinical Impression  Pt appears to be at his cognitive-linguistic baseline and has no subjective complaints. Despite a mild L sided facial droop his speech is clear at the conversational level. No acute SLP needs identified, will s/o.    SLP Assessment  SLP Recommendation/Assessment: Patient does not need any further Speech Lanaguage Pathology Services SLP Visit Diagnosis: Cognitive communication deficit (R41.841)    Follow Up Recommendations  None    Frequency and Duration           SLP Evaluation Cognition  Overall Cognitive Status: Within Functional Limits for tasks assessed Orientation Level: Oriented X4       Comprehension  Auditory Comprehension Overall Auditory Comprehension: Appears within functional limits for tasks assessed    Expression Expression Primary Mode of Expression: Verbal Verbal Expression Overall Verbal Expression: Appears within functional limits for tasks assessed   Oral / Motor  Oral Motor/Sensory Function Overall Oral Motor/Sensory Function: Mild impairment Facial ROM: Reduced left;Suspected CN VII (facial) dysfunction Facial Symmetry: Abnormal symmetry left;Suspected CN VII (facial) dysfunction Facial  Strength: Reduced left;Suspected CN VII (facial) dysfunction Motor Speech Overall Motor Speech: Appears within functional limits for tasks assessed   GO                    Fred Davis, Fred Davis 11/15/2016, 4:02 PM  Fred Davis, M.A. CCC-SLP [phone]

## 2016-11-15 NOTE — Progress Notes (Signed)
Responded  o [place] Consult to pray with patient. Patient made known his request and prayer was made. Patient share a lot of family history that is stressing him. Patient is very concerned about parents and their welfare and care while he is hospitalized.  Patient was alert and more concerned about parents than himself.  Tearful at time but after visit he appeared to be cope better. Chaplain available as needed.  Venida JarvisWatlington, Clora Ohmer, [place], [facility], Pager [phone]

## 2016-11-15 NOTE — Progress Notes (Signed)
OT Cancellation Note  Patient Details Name: Fred Davis MRN: [id number] DOB: 12/01/1955   Cancelled Treatment:    Reason Eval/Treat Not Completed: Patient not medically ready. Pt on bedrest. Please update activity orders when appropriate for therapy. Thanks  [facility] LLC[facility],HILLARY  Kaylin Schellenberg, OT/L  [phone] 11/15/2016 11/15/2016, 7:20 AM

## 2016-11-15 NOTE — Care Management Note (Addendum)
Case Management Note  Patient Details  Name: Fred Davis MRN: [id number] Date of Birth: 02/16/1956  Subjective/Objective:   Pt admitted on 11/14/16 with Rt basal ganglia hemorrhage.  PTA, pt independent of ADLS, lives alone.                   Action/Plan: PT/OT consults pending.  Will follow for discharge planning as pt progresses.    Expected Discharge Date:                  Expected Discharge Plan:     In-House Referral:     Discharge planning Services  CM Consult  Post Acute Care Choice:    Choice offered to:     DME Arranged:    DME Agency:     HH Arranged:    HH Agency:     Status of Service:  In process, will continue to follow  If discussed at Long Length of Stay Meetings, dates discussed:    Additional Comments:  Glennon Macmerson, Erwin Nishiyama M, RN 11/15/2016, 3:49 PM

## 2016-11-16 DIAGNOSIS — E785 Hyperlipidemia, unspecified: Secondary | ICD-10-CM

## 2016-11-16 DIAGNOSIS — I610 Nontraumatic intracerebral hemorrhage in hemisphere, subcortical: Principal | ICD-10-CM

## 2016-11-16 DIAGNOSIS — E669 Obesity, unspecified: Secondary | ICD-10-CM

## 2016-11-16 LAB — BASIC METABOLIC PANEL
ANION GAP: 7 (ref 5–15)
BUN: 14 mg/dL (ref 6–20)
CALCIUM: 9 mg/dL (ref 8.9–10.3)
CO2: 24 mmol/L (ref 22–32)
Chloride: 104 mmol/L (ref 101–111)
Creatinine, Ser: 1 mg/dL (ref 0.61–1.24)
GFR calc Af Amer: >60 mL/min (ref >60)
GLUCOSE: 101 mg/dL — AB (ref 65–99)
Potassium: 3.8 mmol/L (ref 3.5–5.1)
Sodium: 135 mmol/L (ref 135–145)

## 2016-11-16 LAB — CBC
HCT: 48.9 % (ref 39.0–52.0)
Hemoglobin: 16.9 g/dL (ref 13.0–17.0)
MCH: 31.6 pg (ref 26.0–34.0)
MCHC: 34.6 g/dL (ref 30.0–36.0)
MCV: 91.4 fL (ref 78.0–100.0)
PLATELETS: 216 10*3/uL (ref 150–400)
RBC: 5.35 MIL/uL (ref 4.22–5.81)
RDW: 14 % (ref 11.5–15.5)
WBC: 13.8 10*3/uL — ABNORMAL HIGH (ref 4.0–10.5)

## 2016-11-16 LAB — HEMOGLOBIN A1C
Hgb A1c MFr Bld: 5.7 % — ABNORMAL HIGH (ref 4.8–5.6)
MEAN PLASMA GLUCOSE: 117 mg/dL

## 2016-11-16 MED ORDER — HYDRALAZINE HCL 20 MG/ML IJ SOLN: 5.0000 mg | INTRAMUSCULAR | Status: DC | PRN

## 2016-11-16 MED ORDER — LABETALOL HCL 5 MG/ML IV SOLN: 10.0000 mg | INTRAVENOUS | Status: DC | PRN

## 2016-11-16 NOTE — Discharge Summary (Signed)
Stroke Discharge Summary  Patient ID: Fred Davis    l   MRN: [id number]      DOB: 01/02/1956  Date of Admission: 11/14/2016 Date of Discharge: 11/19/2016  Attending Physician:  Micki RileySethi, Jahmez Bily S, MD, Stroke MD Consultant(s):   Treatment Team:  Stroke, Md, MD pulmonary/intensive care Patient's PCP:  Patient, No Pcp Per   DISCHARGE DIAGNOSIS: Principal Problem:   ICH (intracerebral hemorrhage) (HCC) - right acute ICH at right external capsule etiology hypertensive   Past Medical History:  Diagnosis Date  . Hypertension    Past Surgical History:  Procedure Laterality Date  . HERNIA REPAIR      Allergies as of 11/19/2016   No Known Allergies     Medication List    STOP taking these medications   hydrochlorothiazide 25 MG tablet Commonly known as:  HYDRODIURIL   meloxicam 15 MG tablet Commonly known as:  MOBIC     TAKE these medications   cyclobenzaprine 5 MG tablet Commonly known as:  FLEXERIL Take 1 tablet (5 mg total) by mouth 3 (three) times daily as needed for muscle spasms.   diphenhydramine-acetaminophen 25-500 MG Tabs tablet Commonly known as:  TYLENOL PM Take 1 tablet by mouth at bedtime as needed.   lisinopril 20 MG tablet Commonly known as:  PRINIVIL,ZESTRIL Take 1 tablet (20 mg total) by mouth 2 (two) times daily.   temazepam 30 MG capsule Commonly known as:  RESTORIL Take 1 capsule (30 mg total) by mouth at bedtime as needed for sleep.   tetrahydrozoline 0.05 % ophthalmic solution Place 2 drops into both eyes as needed.       LABORATORY STUDIES CBC    Component Value Date/Time   WBC 12.3 (H) 11/19/2016 0323   RBC 5.42 11/19/2016 0323   HGB 16.9 11/19/2016 0323   HCT 50.2 11/19/2016 0323   PLT 234 11/19/2016 0323   MCV 92.6 11/19/2016 0323   MCV 93.6 06/14/2011 1034   MCH 31.2 11/19/2016 0323   MCHC 33.7 11/19/2016 0323   RDW 14.0 11/19/2016 0323   LYMPHSABS 3.0 11/14/2016 2100   MONOABS 1.1 (H) 11/14/2016 2100   EOSABS 0.3 11/14/2016  2100   BASOSABS 0.1 11/14/2016 2100   CMP    Component Value Date/Time   NA 135 11/19/2016 0323   K 3.8 11/19/2016 0323   CL 105 11/19/2016 0323   CO2 22 11/19/2016 0323   GLUCOSE 87 11/19/2016 0323   BUN 26 (H) 11/19/2016 0323   CREATININE 1.17 11/19/2016 0323   CREATININE 0.86 07/13/2014 1017   CALCIUM 8.7 (L) 11/19/2016 0323   PROT 8.0 11/14/2016 2100   ALBUMIN 4.3 11/14/2016 2100   AST 25 11/14/2016 2100   ALT 20 11/14/2016 2100   ALKPHOS 69 11/14/2016 2100   BILITOT 0.8 11/14/2016 2100   GFRNONAA >60 11/19/2016 0323   GFRAA >60 11/19/2016 0323   COAGS Lab Results  Component Value Date   INR 0.99 11/14/2016   Lipid Panel    Component Value Date/Time   CHOL 154 11/15/2016 0653   TRIG 61 11/15/2016 0653   HDL 44 11/15/2016 0653   CHOLHDL 3.5 11/15/2016 0653   VLDL 12 11/15/2016 0653   LDLCALC 98 11/15/2016 0653   HgbA1C  Lab Results  Component Value Date   HGBA1C 5.7 (H) 11/15/2016   Urinalysis    Component Value Date/Time   COLORURINE YELLOW 11/17/2016 1538   APPEARANCEUR HAZY (A) 11/17/2016 1538   LABSPEC 1.025 11/17/2016 1538   PHURINE 5.5 11/17/2016  1538   GLUCOSEU NEGATIVE 11/17/2016 1538   HGBUR NEGATIVE 11/17/2016 1538   BILIRUBINUR SMALL (A) 11/17/2016 1538   BILIRUBINUR small 06/14/2011 1034   KETONESUR 15 (A) 11/17/2016 1538   PROTEINUR 30 (A) 11/17/2016 1538   UROBILINOGEN 0.2 06/14/2011 1034   UROBILINOGEN 0.2 11/09/2010 1430   NITRITE NEGATIVE 11/17/2016 1538   LEUKOCYTESUR NEGATIVE 11/17/2016 1538   Urine Drug Screen     Component Value Date/Time   LABOPIA NONE DETECTED 11/15/2016 1617   COCAINSCRNUR NONE DETECTED 11/15/2016 1617   LABBENZ NONE DETECTED 11/15/2016 1617   AMPHETMU NONE DETECTED 11/15/2016 1617   THCU POSITIVE (A) 11/15/2016 1617   LABBARB NONE DETECTED 11/15/2016 1617    Alcohol Level No results found for: [facility]   SIGNIFICANT DIAGNOSTIC STUDIES Ct Angio Head and neck W Or Wo Contrast 11/15/2016 IMPRESSION: 1.  No visible vascular abnormality to explain the patient's hemorrhage. 2. Bilateral vertebral origin atherosclerotic stenosis, high-grade on the right and mild moderate on the left. 3. Cervical carotid and intracranial atherosclerosis without significant stenosis.   Ct Head Code Stroke W/o Cm 11/14/2016 IMPRESSION: 1. 2.5 x 1.4 x 2.3 cm (estimated volume 4.0 mL) acute intraparenchymal hemorrhage centered at the right lentiform nucleus. No significant regional mass effect. 2. Underlying chronic microvascular ischemic disease.   2D Echo 11/15/2016 ef 60-65%, normal wall motion     HISTORY OF PRESENT ILLNESS Fred Hinsonis a 61 y.o.malewith a history of hypertension, tobacco abuse, occasional marijuana use who has not been on hypertensive medications after he was "takenoff them"(patient not certain why) a while ago (patient not sure how long ago). He was watching TV when around 7:30 PM he noticed that his left side wasn't working quite right. He called 911 subsequently when things were not getting better. He was brought in as a code stroke and the initial head CT revealed a right basal ganglia hemorrhage.  LKW: 7:30 PM ICH Score: 0 NIHSS: 5 Patient was admitted to the intensive care unit with blood pressure was tightly controlled and he had close neurology for monitoring. He remained stable. Repeat CT scan showed stable appearance of the intra-cerebral hemorrhage. He was in a physical occupational speech therapy and felt to have no therapy needs at the time of discharge. He was counseled to take blood pressure medications and to have follow-up with the primary care doctor which he promised to do so. He was advised to return for follow-up in the stroke clinic in 6 weeks or call earlier if necessary .He was advised to eliminate salt from his diet and quit smoking. He voiced understanding. HOSPITAL COURSE [name] is a 61 y.o. male with history of HTN, smoking, marijuana presenting with left  sided weakness. He did not receive IV t-PA due to intraparenchymal hemorrhage .   ICH:  right acute ICH at right external capsule likely due to uncontrolled hypertension  Resultant  Right facial droop   CT head - small right external capsule ICH  CTA head and neck - stable hematoma, b/l VA stenosis, no aneurysm or AVM   2D Echo  EF 60-65% with no wall motion abnormalities   LDL 98  HgbA1c 5.7  SCD for VTE prophylaxis  Diet heart healthy/carb modified Room service appropriate? Yes; Fluid consistency: Thin  No antithrombotic prior to admission, now on No antithrombotic  Patient counseled to be compliant with his antithrombotic medications  Ongoing aggressive stroke risk factor management  Therapy recommendations:  Outpatient PT recommended - No OT follow-up recommended.  Disposition:  home  Hypertension  Noncompliance with BP meds a thome  Stable on cardene  Goal BP <160 systolic  Resume home BP meds HCTZ, also add lisinopril  Taper off cardene as able  Long-term BP goal normotensive  Hyperlipidemia  LDL 98, goal < 70  No need to initiate statin at this time  Hypothyroidism:   TSH of 15  checked free T4 of 0.49   will not recommend starting any meds on discharge. Lab values need to be repeated 6 weeks after discharge to determine whether he has overt hypothyroidism, subclinical, or if he experiences any symptoms   Tobacco abuse  Current smoker  Smoking cessation counseling provided  Pt is willing to quit  Renal - dehydration ?  BUN increased ; 14 -> 30 which improved after stopping hctz on Sunday   Creatinine increased ; 0.92 -> 1.43 --> 1.17 - improving  Lisinopril 20 mg Bid started Friday ; HCTZ 12.5 mg daily started Saturday and stopped Sunday   BP well controlled - 110/68 -  but suspect we need to change meds and hydrate  D/C HCTZ   Other Stroke Risk Factors  Marijuana smoker - cessation counseling provided  ETOH use, advised  to drink no more than 2 drink(s) a day  Obesity, Body mass index is 38.96 kg/m., recommend weight loss, diet and exercise as appropriate   Family hx stroke (father)  Other Active Problems  Mild leukocytosis. Likely reactive.   DISCHARGE EXAM Blood pressure 123/82, pulse 63, temperature 97.6 F (36.4 C), temperature source Oral, resp. rate 18, height 5\' 11"  (1.803 m), weight 279 lb 5.2 oz (126.7 kg), SpO2 98 %. General - Well nourished, well developed, in no apparent distress.  Ophthalmologic - Sharp disc margins OU.   Cardiovascular - Regular rate and rhythm.  Mental Status -  Level of arousal and orientation to time, place, and person were intact. Language including expression, naming, repetition, comprehension was assessed and found intact. Fund of Knowledge was assessed and was intact.  Cranial Nerves II - XII - II - Visual field intact OU. III, IV, VI - Extraocular movements intact. V - Facial sensation intact bilaterally. VII - right mild facial asymmetry. VIII - Hearing & vestibular intact bilaterally. X - Palate elevates symmetrically. XI - Chin turning & shoulder shrug intact bilaterally. XII - Tongue protrusion intact.  Motor Strength - The patient's strength was normal in all extremities.  Bulk was normal and fasciculations were absent.   Motor Tone - Muscle tone was assessed at the neck and appendages and was normal.  Reflexes - The patient's reflexes were 1+ in all extremities and he had no pathological reflexes.  Sensory - Light touch, temperature/pinprick were assessed and were symmetrical.    Coordination - The patient had normal movements in the hands with no ataxia or dysmetria.  Tremor was absent.  Gait and Station - deferred  Discharge Diet   Diet heart healthy/carb modified Room service appropriate? Yes; Fluid consistency: Thin Diet - low sodium heart healthy liquids  DISCHARGE PLAN  Disposition:  home  No antithrombotic for secondary  stroke prevention.  Ongoing risk factor control by Primary Care Physician at time of discharge  Follow-up Patient, No Pcp Per in 2 weeks- [facility]. Recommended to follow up with them .  Follow-up with Dr. Delia HeadyPramod Kesa Birky, Stroke Clinic in 6 weeks, office to schedule an appointment.  Signed Deneise LeverParth Saraiya MD IMTS  Stroke Team   Time spent on discharge summary 32  minutes  I have personally examined this patient, reviewed notes, independently viewed imaging studies, participated in medical decision making and plan of care.ROS completed by me personally and pertinent positives fully documented  I have made any additions or clarifications directly to the above note. Agree with note above.    Delia Heady, MD Medical Director [facility] Pager: [phone] 11/19/2016 2:57 PM

## 2016-11-16 NOTE — Progress Notes (Signed)
STROKE TEAM PROGRESS NOTE   SUBJECTIVE (INTERVAL HISTORY) No acute events overnight. Pt feeling better. He feels that he is doing well. Left sided weakness resolved still has mild left facial asymmetry. However, he still on Cardene. Will increase po BP meds and relax BP goal. Repeat CT head yesterday stable.     OBJECTIVE Temp:  [96.5 F (35.8 C)-98.6 F (37 C)] 96.5 F (35.8 C) (08/03 0800) Pulse Rate:  [45-91] 63 (08/03 1030) Cardiac Rhythm: Normal sinus rhythm;Sinus bradycardia (08/03 0800) Resp:  [14-23] 15 (08/03 1030) BP: (106-168)/(45-100) 106/61 (08/03 1030) SpO2:  [91 %-99 %] 98 % (08/03 1030)  CBC:   Recent Labs Lab 11/14/16 2100  11/15/16 0653 11/16/16 0300  WBC 15.2*  --  14.2* 13.8*  NEUTROABS 10.8*  --   --   --   HGB 17.8*  < > 17.0 16.9  HCT 49.9  < > 48.7 48.9  MCV 91.1  --  91.7 91.4  PLT 242  --  237 216  < > = values in this interval not displayed.  Basic Metabolic Panel:   Recent Labs Lab 11/15/16 0653 11/16/16 0300  NA 137 135  K 4.0 3.8  CL 105 104  CO2 23 24  GLUCOSE 107* 101*  BUN 11 14  CREATININE 1.01 1.00  CALCIUM 8.9 9.0    Lipid Panel:     Component Value Date/Time   CHOL 154 11/15/2016 0653   TRIG 61 11/15/2016 0653   HDL 44 11/15/2016 0653   CHOLHDL 3.5 11/15/2016 0653   VLDL 12 11/15/2016 0653   LDLCALC 98 11/15/2016 0653   HgbA1c:  Lab Results  Component Value Date   HGBA1C 5.7 (H) 11/15/2016   Urine Drug Screen:     Component Value Date/Time   LABOPIA NONE DETECTED 11/15/2016 1617   COCAINSCRNUR NONE DETECTED 11/15/2016 1617   LABBENZ NONE DETECTED 11/15/2016 1617   AMPHETMU NONE DETECTED 11/15/2016 1617   THCU POSITIVE (A) 11/15/2016 1617   LABBARB NONE DETECTED 11/15/2016 1617    Alcohol Level No results found for: ETH  IMAGING I have personally reviewed the radiological images below and agree with the radiology interpretations.  Ct Angio Head and neck W Or Wo Contrast 11/15/2016 IMPRESSION: 1. No  visible vascular abnormality to explain the patient's hemorrhage. 2. Bilateral vertebral origin atherosclerotic stenosis, high-grade on the right and mild moderate on the left. 3. Cervical carotid and intracranial atherosclerosis without significant stenosis.   Ct Head Code Stroke W/o Cm 11/14/2016 IMPRESSION: 1. 2.5 x 1.4 x 2.3 cm (estimated volume 4.0 mL) acute intraparenchymal hemorrhage centered at the right lentiform nucleus. No significant regional mass effect. 2. Underlying chronic microvascular ischemic disease.   2D Echo 11/15/2016 ef 60-65%, normal wall motion    PHYSICAL EXAM  Temp:  [96.5 F (35.8 C)-98.6 F (37 C)] 96.5 F (35.8 C) (08/03 0800) Pulse Rate:  [45-91] 63 (08/03 1030) Resp:  [14-23] 15 (08/03 1030) BP: (106-168)/(45-100) 106/61 (08/03 1030) SpO2:  [91 %-99 %] 98 % (08/03 1030)  General - Well nourished, well developed, in no apparent distress.  Ophthalmologic - Sharp disc margins OU.   Cardiovascular - Regular rate and rhythm.  Mental Status -  Level of arousal and orientation to time, place, and person were intact. Language including expression, naming, repetition, comprehension was assessed and found intact. Fund of Knowledge was assessed and was intact.  Cranial Nerves II - XII - II - Visual field intact OU. III, IV, VI - Extraocular movements  intact. V - Facial sensation intact bilaterally. VII - right mild facial asymmetry. VIII - Hearing & vestibular intact bilaterally. X - Palate elevates symmetrically. XI - Chin turning & shoulder shrug intact bilaterally. XII - Tongue protrusion intact.  Motor Strength - The patient's strength was normal in all extremities.  Bulk was normal and fasciculations were absent.   Motor Tone - Muscle tone was assessed at the neck and appendages and was normal.  Reflexes - The patient's reflexes were 1+ in all extremities and he had no pathological reflexes.  Sensory - Light touch, temperature/pinprick were  assessed and were symmetrical.    Coordination - The patient had normal movements in the hands with no ataxia or dysmetria.  Tremor was absent.  Gait and Station - deferred   ASSESSMENT/PLAN [name] is a 61 y.o. male with history of HTN, smoking, marijuana presenting with left sided weakness. He did not receive IV t-PA due to intraparenchymal hemorrhage .   ICH:  right acute ICH at right external capsule likely due to uncontrolled hypertension  Resultant  Right facial droop   CT head - small right external capsule ICH  CTA head and neck - stable hematoma, b/l VA stenosis, no aneurysm or AVM   2D Echo  EF 60-65% with no wall motion abnormalities   LDL 98  HgbA1c 5.7  SCD for VTE prophylaxis Diet heart healthy/carb modified Room service appropriate? Yes; Fluid consistency: Thin  No antithrombotic prior to admission, now on No antithrombotic  Patient counseled to be compliant with his antithrombotic medications  Ongoing aggressive stroke risk factor management  Therapy recommendations:  pending  Disposition:  pending  Hypertension  Noncompliance with BP meds a thome  Stable on cardene  Goal BP <160 systolic  Resume home BP meds HCTZ, also add lisinopril  Taper off cardene as able  Long-term BP goal normotensive  Hyperlipidemia  LDL 98, goal < 70  No need to initiate statin at this time  Hypothyroidism:   TSH of 15  checked free T4 of 0.49   will not recommend starting any meds on discharge. Lab values need to be repeated 6 weeks after discharge to determine whether he has overt hypothyroidism, subclinical, or if he experiences any symptoms   Tobacco abuse  Current smoker  Smoking cessation counseling provided  Pt is willing to quit  Other Stroke Risk Factors  Marijuana smoker - cessation counseling provided  ETOH use, advised to drink no more than 2 drink(s) a day  Obesity, Body mass index is 38.96 kg/m., recommend weight loss, diet  and exercise as appropriate   Family hx stroke (father)  Other Active Problems  Mild leukocytosis. Likely reactive.   Hospital day # 2  This patient is critically ill due to ICH, HTN, hyperglycemia, smoker and at significant risk of neurological worsening, death form hematoma expansion, stroke, DKA, hypertensive emergency. This patient's care requires constant monitoring of vital signs, hemodynamics, respiratory and cardiac monitoring, review of multiple databases, neurological assessment, discussion with family, other specialists and medical decision making of high complexity. I spent 35 minutes of neurocritical care time in the care of this patient.  Marvel PlanJindong Akaya Proffit, MD PhD Stroke Neurology 11/16/2016 2:03 PM    To contact Stroke Continuity provider, please refer to [email or web address]. After hours, contact General Neurology

## 2016-11-16 NOTE — Progress Notes (Signed)
PT Cancellation Note  Patient Details Name: Fred Davis MRN: [id number] DOB: 06/23/1955   Cancelled Treatment:    Reason Eval/Treat Not Completed: Patient not medically ready. Pt currently on bedrest. Will await increased activity orders prior to initiating PT eval. Thanks   Marylynn PearsonLaura D Peni Rupard 11/16/2016, 8:01 AM   Conni SlipperLaura Alakai Macbride, PT, DPT Acute Rehabilitation Services Pager: [phone]

## 2016-11-16 NOTE — Evaluation (Signed)
Physical Therapy Evaluation Patient Details Name: Fred Davis MRN: [id number] DOB: 10/02/1955 Today's Date: 11/16/2016   History of Present Illness  Ptis a 61 y.o.malewith a history of hypertension, tobacco abuse, occasional marijuana use who has not been on hypertensive medications after he was "takenoff them"(patient not certain why) a while ago (patient not sure how long ago). He was watching TV when around 7:30 PM he noticed L side weakness. He called 911 subsequently when things were not getting better. He was brought in as a code stroke and the initial head CT revealed a right basal ganglia hemorrhage.  Clinical Impression  Pt admitted with above diagnosis. Pt currently with functional limitations due to the deficits listed below (see PT Problem List). At the time of PT eval pt was able to perform transfers and ambulation with min guard to light supervision for safety. Occasional unsteadiness noted throughout gait training. Pt reports feeling 90% back to baseline at this time. We discussed prognosis from a function/PT standpoint, and the recommendation that if pt is not progressing as quickly as he would like at d/c, then outpatient PT follow up may be beneficial for him. PT will attempt to see again tomorrow to ensure safety for d/c home. Pt will benefit from skilled PT to increase their independence and safety with mobility to allow discharge to the venue listed below.       Follow Up Recommendations Outpatient PT;Supervision - Intermittent    Equipment Recommendations  3in1 (PT)    Recommendations for Other Services       Precautions / Restrictions Precautions Precautions: Fall Restrictions Weight Bearing Restrictions: No      Mobility  Bed Mobility Overal bed mobility: Needs Assistance Bed Mobility: Sit to Supine       Sit to supine: HOB elevated;Min guard   General bed mobility comments: Close guard fro safety as pt transitioned to EOB. No assist required.    Transfers Overall transfer level: Needs assistance Equipment used: None Transfers: Sit to/from Stand Sit to Stand: Supervision         General transfer comment: Close supervision for safety as pt powered up to full standing position. Pt reports it felt "funny" to him when he stood up, but did not endorse dizziness or lightheadedness.   Ambulation/Gait Ambulation/Gait assistance: Min guard;Supervision Ambulation Distance (Feet): 275 Feet Assistive device: None Gait Pattern/deviations: Step-through pattern;Decreased stride length Gait velocity: Decreased Gait velocity interpretation: Below normal speed for age/gender General Gait Details: Close guard initially progressing to supervision for safety. Occasional unsteadiness noted however no assist required to recover.  Stairs            Wheelchair Mobility    Modified Rankin (Stroke Patients Only)       Balance Overall balance assessment: Needs assistance Sitting-balance support: Feet supported;No upper extremity supported Sitting balance-Leahy Scale: Good     Standing balance support: No upper extremity supported;During functional activity Standing balance-Leahy Scale: Fair                               Pertinent Vitals/Pain Pain Assessment: No/denies pain    Home Living Family/patient expects to be discharged to:: Private residence Living Arrangements: Alone Available Help at Discharge: Family;Available PRN/intermittently (Has family around but are in poor health) Type of Home: House Home Access: Stairs to enter Entrance Stairs-Rails: None Entrance Stairs-Number of Steps: 1 Home Layout: One level Home Equipment: None      Prior Function Level  of Independence: Independent         Comments: Retired; assists father as he is in poor health. Mother is in a nursing home.      Hand Dominance   Dominant Hand: Right    Extremity/Trunk Assessment   Upper Extremity Assessment Upper  Extremity Assessment: LUE deficits/detail LUE Deficits / Details: Slight weakness noted compared to the R side. Pt denies N/T    Lower Extremity Assessment Lower Extremity Assessment: Overall WFL for tasks assessed (Strength equal bilaterally with no N/T reported with testing)    Cervical / Trunk Assessment Cervical / Trunk Assessment: Normal  Communication   Communication: No difficulties  Cognition Arousal/Alertness: Awake/alert Behavior During Therapy: WFL for tasks assessed/performed Overall Cognitive Status: Within Functional Limits for tasks assessed                                        General Comments      Exercises     Assessment/Plan    PT Assessment Patient needs continued PT services  PT Problem List Decreased strength;Decreased range of motion;Decreased activity tolerance;Decreased mobility;Decreased balance;Decreased knowledge of use of DME;Decreased safety awareness;Decreased knowledge of precautions       PT Treatment Interventions DME instruction;Gait training;Stair training;Functional mobility training;Therapeutic activities;Therapeutic exercise;Neuromuscular re-education;Patient/family education    PT Goals (Current goals can be found in the Care Plan section)  Acute Rehab PT Goals Patient Stated Goal: Return to 100%. Pt reports feeling 90% back to baseline at this time.  PT Goal Formulation: With patient Time For Goal Achievement: 11/23/16 Potential to Achieve Goals: Good    Frequency Min 3X/week   Barriers to discharge        Co-evaluation               AM-PAC PT "6 Clicks" Daily Activity  Outcome Measure Difficulty turning over in bed (including adjusting bedclothes, sheets and blankets)?: None Difficulty moving from lying on back to sitting on the side of the bed? : None Difficulty sitting down on and standing up from a chair with arms (e.g., wheelchair, bedside commode, etc,.)?: None Help needed moving to and from a  bed to chair (including a wheelchair)?: None Help needed walking in hospital room?: None Help needed climbing 3-5 steps with a railing? : A Little 6 Click Score: 23    End of Session Equipment Utilized During Treatment: Gait belt Activity Tolerance: Patient tolerated treatment well Patient left: in chair;with call bell/phone within reach Nurse Communication: Mobility status PT Visit Diagnosis: Unsteadiness on feet (R26.81);Other symptoms and signs involving the nervous system (R29.898)    Time: 1206-1228 PT Time Calculation (min) (ACUTE ONLY): 22 min   Charges:   PT Evaluation $PT Eval Moderate Complexity: 1 Mod     PT G Codes:        Fred Davis, PT, DPT Acute Rehabilitation Services Pager: [phone]   Fred Davis 11/16/2016, 1:54 PM

## 2016-11-16 NOTE — Progress Notes (Signed)
OT Cancellation Note  Patient Details Name: Fred Davis MRN: [id number] DOB: 04/02/1956   Cancelled Treatment:    Reason Eval/Treat Not Completed: Patient not medically ready. Pt remains on bedrest.   [facility],HILLARY  [name], OT/L  [phone] 11/16/2016 11/16/2016, 7:38 AM

## 2016-11-17 ENCOUNTER — Inpatient Hospital Stay (HOSPITAL_COMMUNITY): Payer: Federal, State, Local not specified - PPO

## 2016-11-17 LAB — URINALYSIS, ROUTINE W REFLEX MICROSCOPIC
Glucose, UA: NEGATIVE mg/dL
Hgb urine dipstick: NEGATIVE
KETONES UR: 15 mg/dL — AB
Leukocytes, UA: NEGATIVE
NITRITE: NEGATIVE
PROTEIN: 30 mg/dL — AB
Specific Gravity, Urine: 1.025 (ref 1.005–1.030)
pH: 5.5 (ref 5.0–8.0)

## 2016-11-17 LAB — BASIC METABOLIC PANEL
Anion gap: 10 (ref 5–15)
BUN: 14 mg/dL (ref 6–20)
CHLORIDE: 102 mmol/L (ref 101–111)
CO2: 21 mmol/L — AB (ref 22–32)
CREATININE: 0.92 mg/dL (ref 0.61–1.24)
Calcium: 9.2 mg/dL (ref 8.9–10.3)
GFR calc non Af Amer: >60 mL/min (ref >60)
Glucose, Bld: 90 mg/dL (ref 65–99)
POTASSIUM: 3.7 mmol/L (ref 3.5–5.1)
SODIUM: 133 mmol/L — AB (ref 135–145)

## 2016-11-17 LAB — URINALYSIS, MICROSCOPIC (REFLEX): BACTERIA UA: NONE SEEN

## 2016-11-17 LAB — CBC
HCT: 51 % (ref 39.0–52.0)
Hemoglobin: 18.1 g/dL — ABNORMAL HIGH (ref 13.0–17.0)
MCH: 32.2 pg (ref 26.0–34.0)
MCHC: 35.5 g/dL (ref 30.0–36.0)
MCV: 90.7 fL (ref 78.0–100.0)
PLATELETS: 256 10*3/uL (ref 150–400)
RBC: 5.62 MIL/uL (ref 4.22–5.81)
RDW: 14 % (ref 11.5–15.5)
WBC: 14.6 10*3/uL — AB (ref 4.0–10.5)

## 2016-11-17 MED ORDER — HYDROCHLOROTHIAZIDE 12.5 MG PO CAPS
12.5000 mg | ORAL_CAPSULE | Freq: Every day | ORAL | Status: DC
  Administered 2016-11-17 – 2016-11-18 (×2): 12.5 mg via ORAL
  Filled 2016-11-17 (×2): qty 1

## 2016-11-17 NOTE — Evaluation (Signed)
Occupational Therapy Evaluation/Discharge Patient Details Name: Fred Davis MRN: [id number] DOB: 01/10/1956 Today's Date: 11/17/2016    History of Present Illness Ptis a 61 y.o.malewith a history of hypertension, tobacco abuse, occasional marijuana use who has not been on hypertensive medications after he was "takenoff them"(patient not certain why) a while ago (patient not sure how long ago). He was watching TV when around 7:30 PM he noticed L side weakness. He called 911 subsequently when things were not getting better. He was brought in as a code stroke and the initial head CT revealed a right basal ganglia hemorrhage.   Clinical Impression   PTA, pt was independent with all ADL, IADL and mobility. Pt presents with very mild L side weakness and decreased LUE coordination - educated pt to use L hand for as many tasks as possible to reinforce and regain full strength. Pt with no functional vision or balance deficits at this time. Educated pt on signs and symptoms of stroke using teachback method. Pt with no further acute OT needs. OT signing off.     Follow Up Recommendations  No OT follow up    Equipment Recommendations  None recommended by OT    Recommendations for Other Services       Precautions / Restrictions Precautions Precautions: None Restrictions Weight Bearing Restrictions: No      Mobility Bed Mobility Overal bed mobility: Independent                Transfers Overall transfer level: Independent                    Balance Overall balance assessment: Independent                                         ADL either performed or assessed with clinical judgement   ADL Overall ADL's : Independent                                             Vision Baseline Vision/History: Wears glasses Wears Glasses: At all times Patient Visual Report: No change from baseline Vision Assessment?: No apparent visual deficits      Perception     Praxis      Pertinent Vitals/Pain Pain Assessment: No/denies pain     Hand Dominance Right   Extremity/Trunk Assessment Upper Extremity Assessment Upper Extremity Assessment: LUE deficits/detail;Overall WFL for tasks assessed LUE Deficits / Details: Very mild weakness and coordination deficits LUE Coordination: decreased fine motor;decreased gross motor   Lower Extremity Assessment Lower Extremity Assessment: Overall WFL for tasks assessed   Cervical / Trunk Assessment Cervical / Trunk Assessment: Normal   Communication Communication Communication: No difficulties   Cognition Arousal/Alertness: Awake/alert Behavior During Therapy: WFL for tasks assessed/performed Overall Cognitive Status: Within Functional Limits for tasks assessed                                     General Comments       Exercises     Shoulder Instructions      Home Living Family/patient expects to be discharged to:: Private residence Living Arrangements: Alone Available Help at Discharge: Family;Available PRN/intermittently Type of Home: House Home Access: Stairs  to enter Entrance Stairs-Number of Steps: 1 Entrance Stairs-Rails: None Home Layout: One level     Bathroom Shower/Tub: Chief Strategy OfficerTub/shower unit   Bathroom Toilet: Standard     Home Equipment: None      Lives With: Alone    Prior Functioning/Environment Level of Independence: Independent        Comments: Retired; assists father as he is in poor health. Mother is in a nursing home.         OT Problem List: Decreased strength;Decreased coordination      OT Treatment/Interventions:      OT Goals(Current goals can be found in the care plan section) Acute Rehab OT Goals Patient Stated Goal: Return to 100%. Pt reports feeling 94% back to baseline at this time.  OT Goal Formulation: With patient Time For Goal Achievement: 12/01/16 Potential to Achieve Goals: Good  OT Frequency:      Barriers to D/C:            Co-evaluation              AM-PAC PT "6 Clicks" Daily Activity     Outcome Measure Help from another person eating meals?: None Help from another person taking care of personal grooming?: None Help from another person toileting, which includes using toliet, bedpan, or urinal?: None Help from another person bathing (including washing, rinsing, drying)?: None Help from another person to put on and taking off regular upper body clothing?: None Help from another person to put on and taking off regular lower body clothing?: None 6 Click Score: 24   End of Session Nurse Communication: Mobility status  Activity Tolerance: Patient tolerated treatment well Patient left: in bed;with call bell/phone within reach                   Time: 1411-1428 OT Time Calculation (min): 17 min Charges:  OT General Charges $OT Visit: 1 Procedure OT Evaluation $OT Eval Low Complexity: 1 Procedure G-Codes:     Nils PyleJulia Samra Pesch, MS, OTR/L 11/17/2016, 3:30 PM

## 2016-11-17 NOTE — Progress Notes (Signed)
Physical Therapy Treatment Patient Details Name: Fred Davis MRN: [id number] DOB: 12/05/1955 Today's Date: 11/17/2016    History of Present Illness Ptis a 61 y.o.malewith a history of hypertension, tobacco abuse, occasional marijuana use who has not been on hypertensive medications after he was "takenoff them"(patient not certain why) a while ago (patient not sure how long ago). He was watching TV when around 7:30 PM he noticed L side weakness. He called 911 subsequently when things were not getting better. He was brought in as a code stroke and the initial head CT revealed a right basal ganglia hemorrhage.    PT Comments    Pt is progressing well with gait and mobility.  He did well on the DGI demonstrating low fall risk.  He self recovers from any small stepping reaction during gait, and would be safer doing stairs with a railing.  At this point, we will continue to follow him acutely, but I do not think he will need OP PT f/u at this time.    Follow Up Recommendations  No PT follow up;Supervision - Intermittent     Equipment Recommendations  3in1 (PT)    Recommendations for Other Services   NA     Precautions / Restrictions Precautions Precautions: None Precaution Comments: He really, at this point is not a significant fall risk.  Restrictions Weight Bearing Restrictions: No    Mobility  Bed Mobility Overal bed mobility: Modified Independent                Transfers Overall transfer level: Needs assistance Equipment used: None Transfers: Sit to/from Stand Sit to Stand: Supervision         General transfer comment: supervision for safety  Ambulation/Gait Ambulation/Gait assistance: Supervision Ambulation Distance (Feet): 200 Feet Assistive device: None Gait Pattern/deviations: Step-through pattern;Staggering right     General Gait Details: very mildly staggering gait pattern, small side step x 2 to the right, but pt able to recover independently.      Stairs Stairs: Yes   Stair Management: No rails;Alternating pattern;Forwards Number of Stairs: 10 General stair comments: supervision, alternating pattern up, min guard alternating pattern down, educated pt that it would be safer for him to use the rail, especially coming down the stairs.       Modified Rankin (Stroke Patients Only) Modified Rankin (Stroke Patients Only) Pre-Morbid Rankin Score: No symptoms Modified Rankin: Moderately severe disability     Balance Overall balance assessment: Needs assistance Sitting-balance support: Feet supported;No upper extremity supported Sitting balance-Leahy Scale: Good     Standing balance support: No upper extremity supported Standing balance-Leahy Scale: Good                   Standardized Balance Assessment Standardized Balance Assessment : Dynamic Gait Index   Dynamic Gait Index Level Surface: Normal Change in Gait Speed: Normal Gait with Horizontal Head Turns: Mild Impairment Gait with Vertical Head Turns: Normal Gait and Pivot Turn: Normal Step Over Obstacle: Normal Step Around Obstacles: Normal Steps: Mild Impairment Total Score: 22      Cognition Arousal/Alertness: Awake/alert Behavior During Therapy: WFL for tasks assessed/performed Overall Cognitive Status: Within Functional Limits for tasks assessed                                               Pertinent Vitals/Pain Pain Assessment: No/denies pain  PT Goals (current goals can now be found in the care plan section) Acute Rehab PT Goals Patient Stated Goal: Return to 100%. Pt reports feeling 94% back to baseline at this time.  Progress towards PT goals: Progressing toward goals    Frequency    Min 4X/week      PT Plan Discharge plan needs to be updated;Frequency needs to be updated       AM-PAC PT "6 Clicks" Daily Activity  Outcome Measure  Difficulty turning over in bed (including adjusting bedclothes,  sheets and blankets)?: None Difficulty moving from lying on back to sitting on the side of the bed? : None Difficulty sitting down on and standing up from a chair with arms (e.g., wheelchair, bedside commode, etc,.)?: None Help needed moving to and from a bed to chair (including a wheelchair)?: None Help needed walking in hospital room?: None Help needed climbing 3-5 steps with a railing? : A Little 6 Click Score: 23    End of Session Equipment Utilized During Treatment: Gait belt Activity Tolerance: Patient tolerated treatment well Patient left: in chair;with call bell/phone within reach Nurse Communication: Mobility status PT Visit Diagnosis: Unsteadiness on feet (R26.81);Other symptoms and signs involving the nervous system (Z61.096(R29.898)     Time: [id number] PT Time Calculation (min) (ACUTE ONLY): 18 min  Charges:  $Gait Training: 8-22 mins          Allee Busk B. Krystall Kruckenberg, PT, DPT [phone]            11/17/2016, 9:49 AM

## 2016-11-17 NOTE — Plan of Care (Signed)
Problem: Education: Goal: Knowledge of disease or condition will improve Outcome: Completed/Met Date Met: 11/17/16 NIH = 0 Goal: Knowledge of secondary prevention will improve Outcome: Completed/Met Date Met: 11/17/16 Discussed need to take hypertensive medications and S/S of stroke  Problem: Coping: Goal: Ability to identify appropriate support needs will improve Outcome: Completed/Met Date Met: 11/17/16 Daughter and father are his support  Problem: Intracerebral Hemorrhage Tissue Perfusion: Goal: Complications of Intracerebral Hemorrhage will be minimized Outcome: Completed/Met Date Met: 11/17/16 NIH = 0

## 2016-11-17 NOTE — Progress Notes (Signed)
STROKE TEAM PROGRESS NOTE   HISTORY ON ADMISSION Fred Davis is a 61 y.o. male with a history of hypertension who has not been on hypertensive medications after he was "taken off them" (patient not certain why) a while ago (patient not sure how long ago). He was watching TV when around 7:30 PM he noticed that his left side wasn't working quite right. He called 911 subsequently when things were not getting better. He was brought in as a code stroke and the initial head CT revealed a right basal ganglia hemorrhage.  LKW: 7:30 PM tpa given?: no, out of window ICH Score: 0 NIHSS: 5    SUBJECTIVE (INTERVAL HISTORY) No acute events overnight. Pt feeling better. No headache.  Mild dry cough.  No runny nose.  No burning on urination.  No calf pain.  Walking some.      OBJECTIVE Temp:  [96.5 F (35.8 C)-99 F (37.2 C)] 99 F (37.2 C) (08/03 2300) Pulse Rate:  [53-85] 75 (08/04 0600) Cardiac Rhythm: Normal sinus rhythm;Sinus bradycardia (08/03 2000) Resp:  [11-23] 16 (08/04 0500) BP: (102-147)/(57-96) 116/74 (08/04 0600) SpO2:  [90 %-100 %] 90 % (08/04 0600)  CBC:   Recent Labs Lab 11/14/16 2100  11/16/16 0300 11/17/16 0211  WBC 15.2*  < > 13.8* 14.6*  NEUTROABS 10.8*  --   --   --   HGB 17.8*  < > 16.9 18.1*  HCT 49.9  < > 48.9 51.0  MCV 91.1  < > 91.4 90.7  PLT 242  < > 216 256  < > = values in this interval not displayed.  Basic Metabolic Panel:   Recent Labs Lab 11/16/16 0300 11/17/16 0211  NA 135 133*  K 3.8 3.7  CL 104 102  CO2 24 21*  GLUCOSE 101* 90  BUN 14 14  CREATININE 1.00 0.92  CALCIUM 9.0 9.2    Lipid Panel:     Component Value Date/Time   CHOL 154 11/15/2016 0653   TRIG 61 11/15/2016 0653   HDL 44 11/15/2016 0653   CHOLHDL 3.5 11/15/2016 0653   VLDL 12 11/15/2016 0653   LDLCALC 98 11/15/2016 0653   HgbA1c:  Lab Results  Component Value Date   HGBA1C 5.7 (H) 11/15/2016   Urine Drug Screen:     Component Value Date/Time   LABOPIA  NONE DETECTED 11/15/2016 1617   COCAINSCRNUR NONE DETECTED 11/15/2016 1617   LABBENZ NONE DETECTED 11/15/2016 1617   AMPHETMU NONE DETECTED 11/15/2016 1617   THCU POSITIVE (A) 11/15/2016 1617   LABBARB NONE DETECTED 11/15/2016 1617    Alcohol Level No results found for: ETH   IMAGING I have personally reviewed the radiological images below and agree with the radiology interpretations.  Ct Angio Head and neck W Or Wo Contrast 11/15/2016 1. No visible vascular abnormality to explain the patient's hemorrhage.  2. Bilateral vertebral origin atherosclerotic stenosis, high-grade on the right and mild moderate on the left.  3. Cervical carotid and intracranial atherosclerosis without significant stenosis.   Ct Head Code Stroke W/o Cm 11/14/2016 1. 2.5 x 1.4 x 2.3 cm (estimated volume 4.0 mL) acute intraparenchymal hemorrhage centered at the right lentiform nucleus. No significant regional mass effect.  2. Underlying chronic microvascular ischemic disease.   2D Echo 11/15/2016 ef 60-65%, normal wall motion    PHYSICAL EXAM  Temp:  [96.5 F (35.8 C)-99 F (37.2 C)] 99 F (37.2 C) (08/03 2300) Pulse Rate:  [53-85] 75 (08/04 0600) Resp:  [11-23] 16 (08/04 0500)  BP: (102-147)/(57-96) 116/74 (08/04 0600) SpO2:  [90 %-100 %] 90 % (08/04 0600)  General - Well nourished, well developed, in no apparent distress.  Ophthalmologic - Sharp disc margins OU.   Cardiovascular - Regular rate and rhythm.  Mental Status -  Level of arousal and orientation to time, place, and person were intact. Language including expression, naming, repetition, comprehension was assessed and found intact. Fund of Knowledge was assessed and was intact.  Cranial Nerves II - XII - II - Visual field intact OU. III, IV, VI - Extraocular movements intact. V - Facial sensation intact bilaterally. VII - right mild facial asymmetry. VIII - Hearing & vestibular intact bilaterally. X - Palate elevates symmetrically. XI  - Chin turning & shoulder shrug intact bilaterally. XII - Tongue protrusion intact.  Motor Strength - The patient's strength was normal in all extremities.  Bulk was normal and fasciculations were absent.   Motor Tone - Muscle tone was assessed at the neck and appendages and was normal.  Reflexes - The patient's reflexes were 1+ in all extremities and he had no pathological reflexes.  Sensory - Light touch, temperature/pinprick were assessed and were symmetrical.    Coordination - The patient had normal movements in the hands with no ataxia or dysmetria.  Tremor was absent.  Gait and Station - deferred   ASSESSMENT/PLAN Fred Davis is a 61 y.o. male with history of HTN, smoking, marijuana presenting with left sided weakness. He did not receive IV t-PA due to intraparenchymal hemorrhage .   ICH:  right acute ICH at right external capsule likely due to uncontrolled hypertension  Resultant  Right facial droop   CT head - small right external capsule ICH  CTA head and neck - stable hematoma, b/l VA stenosis, no aneurysm or AVM   2D Echo  EF 60-65% with no wall motion abnormalities   LDL 98  HgbA1c 5.7  SCD for VTE prophylaxis Diet heart healthy/carb modified Room service appropriate? Yes; Fluid consistency: Thin  No antithrombotic prior to admission, now on No antithrombotic  Patient counseled to be compliant with his antithrombotic medications  Ongoing aggressive stroke risk factor management  Therapy recommendations:  Outpatient PT recommended - OT evaluation pending  Disposition:  pending  Hypertension  Noncompliance with BP meds a thome  Stable on cardene  Goal BP <160 systolic  Resume home BP meds HCTZ, also add lisinopril  Taper off cardene as able  Long-term BP goal normotensive  BP 113/77  Hyperlipidemia  LDL 98, goal < 70  No need to initiate statin at this time  Hypothyroidism:   TSH of 15  Checked free T4 of 0.49   Will not recommend  starting any meds on discharge. Lab values need to be repeated 6 weeks after discharge to determine whether he has overt hypothyroidism, subclinical, or if he experiences any symptoms   Tobacco abuse  Current smoker  Smoking cessation counseling provided  Pt is willing to quit  Other Stroke Risk Factors  Marijuana smoker - cessation counseling provided  ETOH use, advised to drink no more than 2 drink(s) a day  Obesity, Body mass index is 38.96 kg/m., recommend weight loss, diet and exercise as appropriate   Family hx stroke (father)  Other Active Problems  Mild leukocytosis. Likely reactive.   PLAN  Transfer to floor when bed available  Hospital day # 3   Right BG ICH secondary to hypertension.  BP is well controlled.  His Na is low and likely  secondary to HCTZ.  I will lower the dose to 12.5 mg qd.  No neurological deficits on exam.    WBC 14.6 but afebrile and no clear signs of infection.  Will do U/A, CXR, and monitor.    Awaiting transfer to the floor.    Weston SettleShervin Laron Boorman, MS, MD   This patient is critically ill due to ICH, HTN, hyperglycemia, smoker and at significant risk of neurological worsening, death form hematoma expansion, stroke, DKA, hypertensive emergency. This patient's care requires constant monitoring of vital signs, hemodynamics, respiratory and cardiac monitoring, review of multiple databases, neurological assessment, discussion with family, other specialists and medical decision making of high complexity. I spent 35 minutes of neurocritical care time in the care of this patient.     To contact Stroke Continuity provider, please refer to [email or web address]. After hours, contact General Neurology

## 2016-11-18 LAB — BASIC METABOLIC PANEL
Anion gap: 9 (ref 5–15)
BUN: 30 mg/dL — AB (ref 6–20)
CO2: 22 mmol/L (ref 22–32)
CREATININE: 1.43 mg/dL — AB (ref 0.61–1.24)
Calcium: 9.4 mg/dL (ref 8.9–10.3)
Chloride: 103 mmol/L (ref 101–111)
GFR calc Af Amer: >60 mL/min (ref >60)
GFR calc non Af Amer: 52 mL/min — ABNORMAL LOW (ref >60)
GLUCOSE: 91 mg/dL (ref 65–99)
Potassium: 3.7 mmol/L (ref 3.5–5.1)
SODIUM: 134 mmol/L — AB (ref 135–145)

## 2016-11-18 LAB — CBC
HCT: 52.1 % — ABNORMAL HIGH (ref 39.0–52.0)
Hemoglobin: 18.3 g/dL — ABNORMAL HIGH (ref 13.0–17.0)
MCH: 32.1 pg (ref 26.0–34.0)
MCHC: 35.1 g/dL (ref 30.0–36.0)
MCV: 91.4 fL (ref 78.0–100.0)
PLATELETS: 252 10*3/uL (ref 150–400)
RBC: 5.7 MIL/uL (ref 4.22–5.81)
RDW: 13.9 % (ref 11.5–15.5)
WBC: 14.6 10*3/uL — AB (ref 4.0–10.5)

## 2016-11-18 MED ORDER — SODIUM CHLORIDE 0.9 % IV SOLN
INTRAVENOUS | Status: DC
  Administered 2016-11-18: 21:00:00 via INTRAVENOUS
  Administered 2016-11-18: 1000 mL via INTRAVENOUS
  Administered 2016-11-18: 14:00:00 via INTRAVENOUS

## 2016-11-18 NOTE — Progress Notes (Signed)
STROKE TEAM PROGRESS NOTE   Attending Addendum:   Right BG ICH secondary to hypertension.  BP is well controlled.  No neurological deficits on exam.    WBC is still 14.6 but afebrile and no clear signs of infection.  U/A shows WBC 6-30.  Will order Urine culture and treat if necessary.   CXR is normal.     Hg 18 and BUN and Cr have increased to 30 and 1.4 respectively.  I think that he is very dehydrated and is exhibiting hemoconcentration in his labs, including possibly the WBC.  I will start NS at 150 cc/hour x 2 liters and encouraged oral fluid intake.  I will stop his HCTZ and we will monitor his BP on Lisinopril 40 mg per day monotherapy.  We will repeat labs in the am.  If trending to improvement, then can consider discharging home.       Fred SettleShervin Codi Folkerts, MS, MD     HISTORY ON ADMISSION Fred Davis is a 61 y.o. male with a history of hypertension who has not been on hypertensive medications after he was "taken off them" (patient not certain why) a while ago (patient not sure how long ago). He was watching TV when around 7:30 PM he noticed that his left side wasn't working quite right. He called 911 subsequently when things were not getting better. He was brought in as a code stroke and the initial head CT revealed a right basal ganglia hemorrhage.  LKW: 7:30 PM tpa given?: no, out of window ICH Score: 0 NIHSS: 5    SUBJECTIVE (INTERVAL HISTORY) No acute events overnight. No headache.  No runny nose.  No burning on urination.      OBJECTIVE Temp:  [97 F (36.1 C)-98.2 F (36.8 C)] 98 F (36.7 C) (08/05 0920) Pulse Rate:  [59-84] 84 (08/05 0920) Cardiac Rhythm: Sinus bradycardia (08/05 0700) Resp:  [15-18] 18 (08/05 0920) BP: (107-123)/(68-86) 110/68 (08/05 0920) SpO2:  [95 %-100 %] 100 % (08/05 0920)  CBC:   Recent Labs Lab 11/14/16 2100  11/17/16 0211 11/18/16 0432  WBC 15.2*  < > 14.6* 14.6*  NEUTROABS 10.8*  --   --   --   HGB 17.8*  < > 18.1* 18.3*   HCT 49.9  < > 51.0 52.1*  MCV 91.1  < > 90.7 91.4  PLT 242  < > 256 252  < > = values in this interval not displayed.  Basic Metabolic Panel:   Recent Labs Lab 11/17/16 0211 11/18/16 0432  NA 133* 134*  K 3.7 3.7  CL 102 103  CO2 21* 22  GLUCOSE 90 91  BUN 14 30*  CREATININE 0.92 1.43*  CALCIUM 9.2 9.4    Lipid Panel:     Component Value Date/Time   CHOL 154 11/15/2016 0653   TRIG 61 11/15/2016 0653   HDL 44 11/15/2016 0653   CHOLHDL 3.5 11/15/2016 0653   VLDL 12 11/15/2016 0653   LDLCALC 98 11/15/2016 0653   HgbA1c:  Lab Results  Component Value Date   HGBA1C 5.7 (H) 11/15/2016   Urine Drug Screen:     Component Value Date/Time   LABOPIA NONE DETECTED 11/15/2016 1617   COCAINSCRNUR NONE DETECTED 11/15/2016 1617   LABBENZ NONE DETECTED 11/15/2016 1617   AMPHETMU NONE DETECTED 11/15/2016 1617   THCU POSITIVE (A) 11/15/2016 1617   LABBARB NONE DETECTED 11/15/2016 1617    Alcohol Level No results found for: ETH   IMAGING I have personally reviewed the  radiological images below and agree with the radiology interpretations.  Ct Angio Head and neck W Or Wo Contrast 11/15/2016 1. No visible vascular abnormality to explain the patient's hemorrhage.  2. Bilateral vertebral origin atherosclerotic stenosis, high-grade on the right and mild moderate on the left.  3. Cervical carotid and intracranial atherosclerosis without significant stenosis.   Ct Head Code Stroke W/o Cm 11/14/2016 1. 2.5 x 1.4 x 2.3 cm (estimated volume 4.0 mL) acute intraparenchymal hemorrhage centered at the right lentiform nucleus. No significant regional mass effect.  2. Underlying chronic microvascular ischemic disease.   2D Echo 11/15/2016 ef 60-65%, normal wall motion    CXR 2 View 11/17/2016 No active cardiopulmonary disease.   PHYSICAL EXAM  Temp:  [97 F (36.1 C)-98.2 F (36.8 C)] 98 F (36.7 C) (08/05 0920) Pulse Rate:  [59-84] 84 (08/05 0920) Resp:  [15-18] 18 (08/05  0920) BP: (107-123)/(68-86) 110/68 (08/05 0920) SpO2:  [95 %-100 %] 100 % (08/05 0920)  General - Well nourished, well developed, in no apparent distress.  Ophthalmologic - Sharp disc margins OU.   Cardiovascular - Regular rate and rhythm.  Mental Status -  Level of arousal and orientation to time, place, and person were intact. Language including expression, naming, repetition, comprehension was assessed and found intact. Fund of Knowledge was assessed and was intact.  Cranial Nerves II - XII - II - Visual field intact OU. III, IV, VI - Extraocular movements intact. V - Facial sensation intact bilaterally. VII - right mild facial asymmetry. VIII - Hearing & vestibular intact bilaterally. X - Palate elevates symmetrically. XI - Chin turning & shoulder shrug intact bilaterally. XII - Tongue protrusion intact.  Motor Strength - The patient's strength was normal in all extremities.  Bulk was normal and fasciculations were absent.   Motor Tone - Muscle tone was assessed at the neck and appendages and was normal.  Reflexes - The patient's reflexes were 1+ in all extremities and he had no pathological reflexes.  Sensory - Light touch, temperature/pinprick were assessed and were symmetrical.    Coordination - The patient had normal movements in the hands with no ataxia or dysmetria.  Tremor was absent.  Gait and Station - deferred   ASSESSMENT/PLAN [name] is a 61 y.o. male with history of HTN, smoking, marijuana presenting with left sided weakness. He did not receive IV t-PA due to intraparenchymal hemorrhage .   ICH:  right acute ICH at right external capsule likely due to uncontrolled hypertension  Resultant  Right facial droop   CT head - small right external capsule ICH  CTA head and neck - stable hematoma, b/l VA stenosis, no aneurysm or AVM   2D Echo  EF 60-65% with no wall motion abnormalities   LDL 98  HgbA1c 5.7  SCD for VTE prophylaxis Diet heart  healthy/carb modified Room service appropriate? Yes; Fluid consistency: Thin  No antithrombotic prior to admission, now on No antithrombotic  Patient counseled to be compliant with his antithrombotic medications  Ongoing aggressive stroke risk factor management  Therapy recommendations:  Outpatient PT recommended - No OT follow-up recommended.  Disposition:  pending  Hypertension  Noncompliance with BP meds a thome  Stable on cardene  Goal BP <160 systolic  Resume home BP meds HCTZ, also add lisinopril  Taper off cardene as able  Long-term BP goal normotensive  BP 110/68  Hyperlipidemia  LDL 98, goal < 70  No need to initiate statin at this time  Hypothyroidism:  TSH of 15  Checked free T4 of 0.49   Will not recommend starting any meds on discharge. Lab values need to be repeated 6 weeks after discharge to determine whether he has overt hypothyroidism, subclinical, or if he experiences any symptoms   Tobacco abuse  Current smoker  Smoking cessation counseling provided  Pt is willing to quit  Renal - dehydration ?  BUN increased ; 14 -> 30 since yesterday  Creatinine increased ; 0.92 -> 1.43 since yesterday  Lisinopril 20 mg Bid started Friday ; HCTZ 12.5 mg daily started Saturday  BP well controlled - 110/68 -  but suspect we need to change meds and hydrate  D/C HCTZ   Hydrate - IV NS 150 cc per hr x 2 liters  Consider Norvasc instead of Lotensin  Recheck labs in AM    Other Stroke Risk Factors  Marijuana smoker - cessation counseling provided  ETOH use, advised to drink no more than 2 drink(s) a day  Obesity, Body mass index is 38.96 kg/m., recommend weight loss, diet and exercise as appropriate   Family hx stroke (father)   Leukocytosis  Mild leukocytosis. Likely reactive.   U/A - WBCs -> check culture (CXR - unremarkable) (afebrile)  Leukocytosis may improve with hydration.   PLAN  Transfer to floor when bed  available  Hospital day # 4       This patient is critically ill due to ICH, HTN, hyperglycemia, smoker and at significant risk of neurological worsening, death form hematoma expansion, stroke, DKA, hypertensive emergency. This patient's care requires constant monitoring of vital signs, hemodynamics, respiratory and cardiac monitoring, review of multiple databases, neurological assessment, discussion with family, other specialists and medical decision making of high complexity. I spent 35 minutes of neurocritical care time in the care of this patient.     To contact Stroke Continuity provider, please refer to [email or web address]. After hours, contact General Neurology

## 2016-11-19 LAB — BASIC METABOLIC PANEL
Anion gap: 8 (ref 5–15)
BUN: 26 mg/dL — AB (ref 6–20)
CO2: 22 mmol/L (ref 22–32)
Calcium: 8.7 mg/dL — ABNORMAL LOW (ref 8.9–10.3)
Chloride: 105 mmol/L (ref 101–111)
Creatinine, Ser: 1.17 mg/dL (ref 0.61–1.24)
Glucose, Bld: 87 mg/dL (ref 65–99)
POTASSIUM: 3.8 mmol/L (ref 3.5–5.1)
SODIUM: 135 mmol/L (ref 135–145)

## 2016-11-19 LAB — URINE CULTURE: CULTURE: NO GROWTH

## 2016-11-19 LAB — CBC
HCT: 50.2 % (ref 39.0–52.0)
HEMOGLOBIN: 16.9 g/dL (ref 13.0–17.0)
MCH: 31.2 pg (ref 26.0–34.0)
MCHC: 33.7 g/dL (ref 30.0–36.0)
MCV: 92.6 fL (ref 78.0–100.0)
PLATELETS: 234 10*3/uL (ref 150–400)
RBC: 5.42 MIL/uL (ref 4.22–5.81)
RDW: 14 % (ref 11.5–15.5)
WBC: 12.3 10*3/uL — AB (ref 4.0–10.5)

## 2016-11-19 MED ORDER — LISINOPRIL 20 MG PO TABS: 20.0000 mg | ORAL_TABLET | Freq: Two times a day (BID) | ORAL | 0 refills | Status: DC

## 2016-11-19 NOTE — Care Management Note (Signed)
Case Management Note  Patient Details  Name: Fred Davis MRN: [id number] Date of Birth: 05/07/1955  Subjective/Objective:                    Action/Plan: [facility]. CM provided the patient a list of HH agencies. He selected [facility]. [facility]  PT recommended 3 in 1. Patient currently refusing this DME.  Pt is working on transportation home. Bedside RN updated.   Expected Discharge Date:  11/19/16               Expected Discharge Plan:  Home/Self Care  In-House Referral:     Discharge planning Services  CM Consult  Post Acute Care Choice:  Home Health Choice offered to:  Patient  DME Arranged:    DME Agency:     HH Arranged:  PT HH Agency:  [facility]  Status of Service:  Completed, signed off  If discussed at Long Length of Stay Meetings, dates discussed:    Additional Comments:  Kermit BaloKelli F Quinnten Calvin, RN 11/19/2016, 1:42 PM

## 2016-11-19 NOTE — Care Management Note (Signed)
Case Management Note  Patient Details  Name: Theressa Millarddward Kozar MRN: [id number] Date of Birth: 11/17/1955  Subjective/Objective:                    Action/Plan: No f/u per PT/OT. CM following for d/c needs, physician orders.   Expected Discharge Date:                  Expected Discharge Plan:  Home/Self Care  In-House Referral:     Discharge planning Services  CM Consult  Post Acute Care Choice:    Choice offered to:     DME Arranged:    DME Agency:     HH Arranged:    HH Agency:     Status of Service:  In process, will continue to follow  If discussed at Long Length of Stay Meetings, dates discussed:    Additional Comments:  Kermit BaloKelli F Paxtyn Wisdom, RN 11/19/2016, 12:00 PM

## 2016-11-19 NOTE — Progress Notes (Signed)
Discharge orders received.  Discharge instructions and follow-up appointments reviewed with the patient.  VSS upon discharge.  IV removed and education complete.  All belongings sent with the patient.  Transported out via wheelchair. Talyn Dessert M, RN   

## 2016-11-19 NOTE — Progress Notes (Signed)
PT Cancellation Note  Patient Details Name: Fred Davis MRN: [id number] DOB: 03/25/1956   Cancelled Treatment:    Reason Eval/Treat Not Completed: Other (comment).  Per pt report he is, "getting ready to go home" and does not feel he needs to practice anything with PT.  I saw him in the neuro ICU and was at that time safe to d/c home with no therapy f/u indicated.  PT to follow acutely until d/c confirmed.    Thanks,    Rollene Rotundaebecca B. Haedyn Breau, PT, DPT [phone]   11/19/2016, 1:46 PM

## 2016-11-19 NOTE — Discharge Instructions (Signed)
Please take lisinopril for your blood pressure  Please follow up with Neurology in 6 weeks, and also your primary care doctor,

## 2016-11-23 ENCOUNTER — Other Ambulatory Visit: Payer: Self-pay

## 2016-11-23 NOTE — Patient Outreach (Signed)
[facility] Care Management  11/23/2016  Fred Davis 04/26/1955 [id number]   EMMI: stroke Referral date: 11/23/16 Referral source: EMMI stroke red alert Referral reason: Question / problems with medication:YES Day # 3  Telephone call to patient regarding EMMI stroke red alert referral. HIPAA verified with patient. Discussed EMMI stroke program with patient. Patient verbally agreed to ongoing [facility]. Patient states he is doing pretty good. Patient states he still has some numbness/ tingling in his fingers. Patient states this developed when he had the stroke initially but has noticed it is getting better. Patient states he felt the numbness/ tingling in his fingers a little more on yesterday but it eventually resolved. RNCM advised patient to call his primary MD for symptoms that are of concern. Reviewed signs/ symptoms of stroke with patient. Advised patient to call 911 for stroke like symptoms. Patient verbalized understanding. Patient states he has been doing a lot research to better understand his condition and how to manage it.  Patient states he has had a dry cough since he has started his new medication. Patient states he is aware that his blood pressure pill may be causing the cough. Patient states he has a follow up appointment with his primary MD on on 12/03/16.  RNCM advised patient to call his primary MD office and discuss his symptoms. Patient states he has not smoked since 11/14/16. Patient states he knows that he has to make lifestyle changes. Patient states it depresses him that he allowed his health to get out of control.  RNCM discussed exercise with patient and healthy diet choices. RNCM  Advised patient to continue to take his medications and prescribed and keep follow up doctor appointments.Patient states he has noticed that his taste buds have changed as well. States his food does not taste the same.  Patient reports he  has an appointment scheduled with the neurologist in September 2018.  Advised patient to call neurology office if he has question/ concern regarding non emergent symptoms related to his stroke.    ASSESSMENT; Per electronic medical record note:  Date of Admission 11/14/16 Date of Discharge: 11/19/2016,   ICH (intracerebral hemorrhage) (HCC) - right acute ICH at right external capsule etiology hypertensive 61 y.o.malewith a history of hypertension, tobacco abuse, occasional marijuana usewho has not been on hypertensive medications after he was "takenoff them"(patient not certain why) a while ago (patient not sure how long ago).   PLAN; RNCM will refer patient to care management assistant to close due to patient being assessed and having no further needs.  RNCM will notify patients primary MD of closure.   George InaDavina Domani Bakos RN,BSN,CCM [facility] Telephonic  [phone]

## 2016-11-27 ENCOUNTER — Telehealth: Payer: Self-pay | Admitting: Family Medicine

## 2016-11-27 NOTE — Telephone Encounter (Signed)
Pt is needing to talk with Dr. Neva SeatGreene regarding his lisinopril and that he has developed a dry cough and would like to consider going bact to the older blood pressure medication but did not know the name   Best number [phone]

## 2016-11-29 ENCOUNTER — Other Ambulatory Visit: Payer: Self-pay

## 2016-11-29 NOTE — Telephone Encounter (Signed)
Pt has a DR appointment with Dr Neva SeatGreene on 12/03/2016

## 2016-11-29 NOTE — Patient Outreach (Signed)
[facility] Care Management  11/29/2016  Fred Davis 08/27/1955 [id number]   EMMI: stroke.  Referral date: 11/29/16 Referral source: EMMI stroke red alert Referral reason: sad, hopeless, anxious, or empty Day # 9  Telephone call to patient regarding EMMI stroke red alert. HIPAA verified with patient. Discussed EMMI stroke follow up program with patient. Patient concerned that he is not able to do the things he use to do. Patient states he has felt down about this. Patient states he was told by the doctor he could cut his grass. Patient states he tried to do this recently but had to stop frequently and rest because he was afraid his blood pressure was going to go up and he would have another stroke.  RNCM gave patient contact phone number and discussed stroke support group option with patient.  RNCM reassured patient  That some of the apprehensive feelings he is having is normal following a stroke. Advised patient to take frequent rest breaks as needed due to his body still healing. Advised patient to have a blood pressure monitor for home use or check his blood pressure a few times per week when he is out at a drug store. Advised patient importance of understanding and knowing what his blood pressure is running, what the normal range is for blood pressure and to make sure his  Blood pressure medication is working. Patient states his current blood pressure medication is causing his to have a dry cough. Patient states he contacted his doctors office and left a message but has not heard back. Patient states he has an appointment scheduled with his new primary MD, Dr. Trinna PostJeffery Greene on Monday 12/03/16. Patient states he has an appointment scheduled with Dr. Pearlean BrownieSethi, neurologist in September 2018.  RNCM advised patient to report his concern regarding his blood pressure medication to his primary MD visit on Monday. Advised patient not to stop his medication but  Continue to take as prescribed.  RNCM discussed low salt diet with patient and smoking cessation. Patient state he has not smoked for approximately 2 weeks and is adhering to low salt diet.  Patient state his taste buds have changed since he had his stroke. RNCM informed patient she would send him EMMI education material related to stroke, hypertension, and low salt diet. Patient refused smoking cessation information when offered by [facility]. Patient states he has read information on line regarding smoking cessation.  Patient verbalized appreciation for call and information and encouragement provided.  Patient denies having any new symptoms. RNCM discussed signs/ symptoms of stroke with patient.  Advised patient to call 911 for stroke like symptoms. Advised patient to report any non emergent symptoms to his doctor.   ASSESSMENT:  : Patient hospitalized 11/14/16 - 8/6/ 18 for ICH (intracerebral hemorrhage) (HCC) - right acute ICH at right external capsule etiology hypertensive Per patients chart: 61 y.o.malewith a history of hypertension, tobacco abuse, occasional marijuana usewho has not been on hypertensive medications after he was "takenoff them"(patient not certain why) a while ago (patient not sure how long ago).   PLAN;  RNCM will refer patient to care management assistant to close due to patient being assessed and having no further needs.  RNCM will send patient EMMI education material on stroke, hypertension, low salt diet.   Fred InaDavina Lucianna Ostlund RN,BSN,CCM [facility] Telephonic  [phone]

## 2016-12-03 ENCOUNTER — Encounter: Payer: Self-pay | Admitting: Family Medicine

## 2016-12-03 ENCOUNTER — Ambulatory Visit (INDEPENDENT_AMBULATORY_CARE_PROVIDER_SITE_OTHER): Payer: Federal, State, Local not specified - PPO | Admitting: Family Medicine

## 2016-12-03 VITALS — BP 116/78 | HR 54 | Temp 98.3°F | Resp 18 | Ht 71.0 in | Wt 264.0 lb

## 2016-12-03 DIAGNOSIS — R05 Cough: Secondary | ICD-10-CM | POA: Diagnosis not present

## 2016-12-03 DIAGNOSIS — F418 Other specified anxiety disorders: Secondary | ICD-10-CM | POA: Diagnosis not present

## 2016-12-03 DIAGNOSIS — Z87891 Personal history of nicotine dependence: Secondary | ICD-10-CM

## 2016-12-03 DIAGNOSIS — I639 Cerebral infarction, unspecified: Secondary | ICD-10-CM | POA: Diagnosis not present

## 2016-12-03 DIAGNOSIS — Z789 Other specified health status: Secondary | ICD-10-CM | POA: Diagnosis not present

## 2016-12-03 DIAGNOSIS — R432 Parageusia: Secondary | ICD-10-CM | POA: Diagnosis not present

## 2016-12-03 DIAGNOSIS — R059 Cough, unspecified: Secondary | ICD-10-CM

## 2016-12-03 DIAGNOSIS — Z7289 Other problems related to lifestyle: Secondary | ICD-10-CM

## 2016-12-03 DIAGNOSIS — I10 Essential (primary) hypertension: Secondary | ICD-10-CM

## 2016-12-03 MED ORDER — LOSARTAN POTASSIUM 100 MG PO TABS: 100.0000 mg | ORAL_TABLET | Freq: Every day | ORAL | 1 refills | Status: DC

## 2016-12-03 MED ORDER — SERTRALINE HCL 50 MG PO TABS: 50.0000 mg | ORAL_TABLET | Freq: Every day | ORAL | 1 refills | Status: DC

## 2016-12-03 NOTE — Progress Notes (Signed)
Subjective:  By signing my name below, I, Fred Davis, attest that this documentation has been prepared under the direction and in the presence of [name], MD Electronically Signed: Ladene Artist, ED Scribe 12/03/2016 at 1:40 PM.   Patient ID: Fred Davis, male    DOB: November 13, 1955, 61 y.o.   MRN: [id number]  Chief Complaint  Patient presents with  . Stroke Symptoms    states he can't eat anything.    Marland Kitchen Hospitalization Follow-up    had a stroke and here to see how everything is going  . depression score    12; he was glad these questions were asked; has been feeling depressed   HPI Fred Davis is a 62 y.o. male who presents to Primary Care at [facility] for follow-up on stroke.  Hospitalization 8/1-8/6 with R ICH stroke at the R external capsule thought to be due to untreated HTN. physical occupation speech therapy through hospital. Resulted in R facial droop. No outpatient f/u reccommended through PT/OT. Discharged on Lisinopril 20 mg bid. Initial creatinine increased during hospitalization, .92 to 1.43 to 1.47. Improving off HCTZ. Planned for neuro f/u in 6 weeks Dr. Leonie Man. Had a speech therapy eval 8/2. Clear speech besides facial droop. No acute SLP needs identified. Today here for f/u, difficulty eating and positive depression screening. Denies SI.  Pt reports dry cough which he noticed during hospitalization but his brother noticed cough prior to hospitalization. States cough wakes him during the night. CXR 8/4 showed no active disease.   Tastes Buds Pt states that his taste buds have changed significantly since the stroke. He reports significant weight loss due to abnormal tastes as well.  Wt Readings from Last 3 Encounters:  12/03/16 264 lb (119.7 kg)  11/14/16 279 lb 5.2 oz (126.7 kg)  08/10/15 250 lb (113.4 kg)   Depression Screening  Pt usually drinks 3 beers in a sitting while watching a football game, ~8 beers/week. DUI in 1986. Pt quit smoking on 8/1. He is  retired. Reports feeling anxious and depressed for quite a while. States he recently had 2 pieces of chicken which caused severe anxiety because he felt that it caused elevated BP although he did not actually check his BP. Also reports increased anxiety with mowing his yard due to fear of elevated BP. States he is anxious about his BP increasing again and causing another stroke. Pt does not have a home BP cuff. Pt feels depressed from "watching his parents die" and his "brothers feed off of them like vultures". He met with counselors in the past years ago which did not seem to provide much help. Pt has a 13 y.o daughter. Denies SI.   Depression screen [facility] 2/9 12/03/2016 08/10/2015 01/01/2015 12/25/2014 12/18/2014  Decreased Interest 3 0 0 0 0  Down, Depressed, Hopeless 3 0 0 0 0  PHQ - 2 Score 6 0 0 0 0  Altered sleeping 0 - - - -  Tired, decreased energy 1 - - - -  Change in appetite 3 - - - -  Feeling bad or failure about yourself  0 - - - -  Trouble concentrating 2 - - - -  Moving slowly or fidgety/restless 0 - - - -  Suicidal thoughts 0 - - - -  PHQ-9 Score 12 - - - -  Difficult doing work/chores Somewhat difficult - - - -   Patient Active Problem List   Diagnosis Date Noted  . ICH (intracerebral hemorrhage) (HCC) - right  acute ICH at right external capsule 11/14/2016  . Left ankle swelling 12/18/2014  . Left ankle pain 12/18/2014   Past Medical History:  Diagnosis Date  . Hypertension    Past Surgical History:  Procedure Laterality Date  . HERNIA REPAIR     No Known Allergies Prior to Admission medications   Medication Sig Start Date End Date Taking? Authorizing Provider  diphenhydramine-acetaminophen (TYLENOL PM) 25-500 MG TABS Take 1 tablet by mouth at bedtime as needed.   Yes [provider]  lisinopril (PRINIVIL,ZESTRIL) 20 MG tablet Take 1 tablet (20 mg total) by mouth 2 (two) times daily. 11/19/16  Yes Deneise Lever, MD  temazepam (RESTORIL) 30 MG capsule Take 1  capsule (30 mg total) by mouth at bedtime as needed for sleep. 08/10/15  Yes Trena Platt D, PA  tetrahydrozoline 0.05 % ophthalmic solution Place 2 drops into both eyes as needed.   Yes [provider]   Social History   Social History  . Marital status: Divorced    Spouse name: N/A  . Number of children: N/A  . Years of education: N/A   Occupational History  . Not on file.   Social History Main Topics  . Smoking status: Light Tobacco Smoker    Types: Cigarettes  . Smokeless tobacco: Never Used  . Alcohol use 0.0 oz/week  . Drug use: No  . Sexual activity: Not on file   Other Topics Concern  . Not on file   Social History Narrative  . No narrative on file   Review of Systems  Respiratory: Positive for cough.   Psychiatric/Behavioral: Positive for dysphoric mood. Negative for suicidal ideas. The patient is nervous/anxious.       Objective:   Physical Exam  Constitutional: He is oriented to person, place, and time. He appears well-developed and well-nourished.  HENT:  Head: Normocephalic and atraumatic.  Mouth/Throat: No oral lesions.  Mouth: No oral lesions including on top of tongue.  Eyes: Pupils are equal, round, and reactive to light. EOM are normal.  Neck: No JVD present. Carotid bruit is not present.  Cardiovascular: Normal rate, regular rhythm and normal heart sounds.   No murmur heard. Pulmonary/Chest: Effort normal and breath sounds normal. He has no rales.  Musculoskeletal: He exhibits no edema.  Neurological: He is alert and oriented to person, place, and time.  Minimal droop, decreased movement on L face. No air leakage with puffing cheeks.  Skin: Skin is warm and dry.  Psychiatric: He has a normal mood and affect.  Vitals reviewed.  Vitals:   12/03/16 1321  BP: 116/78  Pulse: (!) 54  Resp: 18  Temp: 98.3 F (36.8 C)  TempSrc: Oral  SpO2: 95%  Weight: 264 lb (119.7 kg)  Height: 5\' 11"  (1.803 m)      Assessment & Plan:     Fred Davis is a 61 y.o. male Essential hypertension - Plan: Basic metabolic panel, losartan (COZAAR) 100 MG tablet  - Controlled in office, but with reported dry cough and possible association with ACE inhibitor, will change to losartan 100 mg daily. Monitor readings outside of office for stability.  Cerebrovascular accident (CVA), unspecified mechanism (HCC) Abnormal sense of taste  - Minimal left facial droop, barely noticeable. Did not appear to need any PT/OT/speech therapy based on evaluation in hospital. Abnormal taste likely due to stroke and affect on seventh nerve. Bland foods discussed, and can try ensure or boost in the meantime.  Cough  - Reassuring exam. Trial off  of ACE inhibitor, then return to clinic precautions if persistent.  Alcohol use History of tobacco abuse  - Has quit smoking since stroke, and has avoided alcohol. Previous alcohol use primarily with watching football games. Recommended against alcohol use at this time, especially with depression symptoms as below.  Depression with anxiety - Plan: sertraline (ZOLOFT) 50 MG tablet  - Suspect some component of adjustment with situational stressors and family, and his recent health changes, but appears to have some long-standing depression symptoms.  -Start Zoloft 50 mg daily, phone numbers for counselors provided, follow-up in next week.  Meds ordered this encounter  Medications  . losartan (COZAAR) 100 MG tablet    Sig: Take 1 tablet (100 mg total) by mouth daily.    Dispense:  30 tablet    Refill:  1  . sertraline (ZOLOFT) 50 MG tablet    Sig: Take 1 tablet (50 mg total) by mouth daily.    Dispense:  30 tablet    Refill:  1   Patient Instructions   For cough, it could be due to the blood pressure medicine or other causes with history of smoking. Xray looked ok in hospital, try losartan once per day (stop lisinopril after tonight's dose) and recheck in next 1 week. Keep a record of your blood pressures  outside of the office and bring them to the next office visit.    For abnormal taste, that may be related to the stroke. I would recommend discussing those symptoms with neurologist - you may need to contact them sooner than planned follow up.  You can try Ensure or Boost for now so you obtain calories. Make sure you drink plenty of fluids.   See information on stress below. I would like you to meet with counselor to discuss your stress, anxiety and depression issues. We can also try to start zoloft once per day for these symptoms.  If you have any suicidal thoughts - call 911.    Please call counselor for appointment.  Karmen Bongo: [phone] [place] Ped: [phone]  Return to the clinic or go to the nearest emergency room if any of your symptoms worsen or new symptoms occur.   Stress and Stress Management Stress is a normal reaction to life events. It is what you feel when life demands more than you are used to or more than you can handle. Some stress can be useful. For example, the stress reaction can help you catch the last bus of the day, study for a test, or meet a deadline at work. But stress that occurs too often or for too long can cause problems. It can affect your emotional health and interfere with relationships and normal daily activities. Too much stress can weaken your immune system and increase your risk for physical illness. If you already have a medical problem, stress can make it worse. What are the causes? All sorts of life events may cause stress. An event that causes stress for one person may not be stressful for another person. Major life events commonly cause stress. These may be positive or negative. Examples include losing your job, moving into a new home, getting married, having a baby, or losing a loved one. Less obvious life events may also cause stress, especially if they occur day after day or in combination. Examples include working long hours, driving in traffic,  caring for children, being in debt, or being in a difficult relationship. What are the signs or symptoms? Stress may cause emotional  symptoms including, the following:  Anxiety. This is feeling worried, afraid, on edge, overwhelmed, or out of control.  Anger. This is feeling irritated or impatient.  Depression. This is feeling sad, down, helpless, or guilty.  Difficulty focusing, remembering, or making decisions.  Stress may cause physical symptoms, including the following:  Aches and pains. These may affect your head, neck, back, stomach, or other areas of your body.  Tight muscles or clenched jaw.  Low energy or trouble sleeping.  Stress may cause unhealthy behaviors, including the following:  Eating to feel better (overeating) or skipping meals.  Sleeping too little, too much, or both.  Working too much or putting off tasks (procrastination).  Smoking, drinking alcohol, or using drugs to feel better.  How is this diagnosed? Stress is diagnosed through an assessment by your health care provider. Your health care provider will ask questions about your symptoms and any stressful life events.Your health care provider will also ask about your medical history and may order blood tests or other tests. Certain medical conditions and medicine can cause physical symptoms similar to stress. Mental illness can cause emotional symptoms and unhealthy behaviors similar to stress. Your health care provider may refer you to a mental health professional for further evaluation. How is this treated? Stress management is the recommended treatment for stress.The goals of stress management are reducing stressful life events and coping with stress in healthy ways. Techniques for reducing stressful life events include the following:  Stress identification. Self-monitor for stress and identify what causes stress for you. These skills may help you to avoid some stressful events.  Time management. Set  your priorities, keep a calendar of events, and learn to say "no." These tools can help you avoid making too many commitments.  Techniques for coping with stress include the following:  Rethinking the problem. Try to think realistically about stressful events rather than ignoring them or overreacting. Try to find the positives in a stressful situation rather than focusing on the negatives.  Exercise. Physical exercise can release both physical and emotional tension. The key is to find a form of exercise you enjoy and do it regularly.  Relaxation techniques. These relax the body and mind. Examples include yoga, meditation, tai chi, biofeedback, deep breathing, progressive muscle relaxation, listening to music, being out in nature, journaling, and other hobbies. Again, the key is to find one or more that you enjoy and can do regularly.  Healthy lifestyle. Eat a balanced diet, get plenty of sleep, and do not smoke. Avoid using alcohol or drugs to relax.  Strong support network. Spend time with family, friends, or other people you enjoy being around.Express your feelings and talk things over with someone you trust.  Counseling or talktherapy with a mental health professional may be helpful if you are having difficulty managing stress on your own. Medicine is typically not recommended for the treatment of stress.Talk to your health care provider if you think you need medicine for symptoms of stress. Follow these instructions at home:  Keep all follow-up visits as directed by your health care provider.  Take all medicines as directed by your health care provider. Contact a health care provider if:  Your symptoms get worse or you start having new symptoms.  You feel overwhelmed by your problems and can no longer manage them on your own. Get help right away if:  You feel like hurting yourself or someone else. This information is not intended to replace advice given to you by your  health care  provider. Make sure you discuss any questions you have with your health care provider. Document Released: 09/26/2000 Document Revised: 09/08/2015 Document Reviewed: 11/25/2012 Elsevier Interactive Patient Education  2017 [employer].    How to Take Your Blood Pressure You can take your blood pressure at home with a machine. You may need to check your blood pressure at home:  To check if you have high blood pressure (hypertension).  To check your blood pressure over time.  To make sure your blood pressure medicine is working.  Supplies needed: You will need a blood pressure machine, or monitor. You can buy one at a drugstore or online. When choosing one:  Choose one with an arm cuff.  Choose one that wraps around your upper arm. Only one finger should fit between your arm and the cuff.  Do not choose one that measures your blood pressure from your wrist or finger.  Your doctor can suggest a monitor. How to prepare Avoid these things for 30 minutes before checking your blood pressure:  Drinking caffeine.  Drinking alcohol.  Eating.  Smoking.  Exercising.  Five minutes before checking your blood pressure:  Pee.  Sit in a dining chair. Avoid sitting in a soft couch or armchair.  Be quiet. Do not talk.  How to take your blood pressure Follow the instructions that came with your machine. If you have a digital blood pressure monitor, these may be the instructions: 1. Sit up straight. 2. Place your feet on the floor. Do not cross your ankles or legs. 3. Rest your left arm at the level of your heart. You may rest it on a table, desk, or chair. 4. Pull up your shirt sleeve. 5. Wrap the blood pressure cuff around the upper part of your left arm. The cuff should be 1 inch (2.5 cm) above your elbow. It is best to wrap the cuff around bare skin. 6. Fit the cuff snugly around your arm. You should be able to place only one finger between the cuff and your arm. 7. Put the cord  inside the groove of your elbow. 8. Press the power button. 9. Sit quietly while the cuff fills with air and loses air. 10. Write down the numbers on the screen. 11. Wait 2-3 minutes and then repeat steps 1-10.  What do the numbers mean? Two numbers make up your blood pressure. The first number is called systolic pressure. The second is called diastolic pressure. An example of a blood pressure reading is "120 over 80" (or 120/80). If you are an adult and do not have a medical condition, use this guide to find out if your blood pressure is normal: Normal  First number: below 120.  Second number: below 80. Elevated  First number: 120-129.  Second number: below 80. Hypertension stage 1  First number: 130-139.  Second number: 80-89. Hypertension stage 2  First number: 140 or above.  Second number: 27 or above. Your blood pressure is above normal even if only the top or bottom number is above normal. Follow these instructions at home:  Check your blood pressure as often as your doctor tells you to.  Take your monitor to your next doctor's appointment. Your doctor will: ? Make sure you are using it correctly. ? Make sure it is working right.  Make sure you understand what your blood pressure numbers should be.  Tell your doctor if your medicines are causing side effects. Contact a doctor if:  Your blood pressure  keeps being high. Get help right away if:  Your first blood pressure number is higher than 180.  Your second blood pressure number is higher than 120. This information is not intended to replace advice given to you by your health care provider. Make sure you discuss any questions you have with your health care provider. Document Released: 03/15/2008 Document Revised: 02/29/2016 Document Reviewed: 09/09/2015 Elsevier Interactive Patient Education  2018 [employer].    Cough, Adult Coughing is a reflex that clears your throat and your airways. Coughing helps  to heal and protect your lungs. It is normal to cough occasionally, but a cough that happens with other symptoms or lasts a long time may be a sign of a condition that needs treatment. A cough may last only 2-3 weeks (acute), or it may last longer than 8 weeks (chronic). What are the causes? Coughing is commonly caused by:  Breathing in substances that irritate your lungs.  A viral or bacterial respiratory infection.  Allergies.  Asthma.  Postnasal drip.  Smoking.  Acid backing up from the stomach into the esophagus (gastroesophageal reflux).  Certain medicines.  Chronic lung problems, including COPD (or rarely, lung cancer).  Other medical conditions such as heart failure.  Follow these instructions at home: Pay attention to any changes in your symptoms. Take these actions to help with your discomfort:  Take medicines only as told by your health care provider. ? If you were prescribed an antibiotic medicine, take it as told by your health care provider. Do not stop taking the antibiotic even if you start to feel better. ? Talk with your health care provider before you take a cough suppressant medicine.  Drink enough fluid to keep your urine clear or pale yellow.  If the air is dry, use a cold steam vaporizer or humidifier in your bedroom or your home to help loosen secretions.  Avoid anything that causes you to cough at work or at home.  If your cough is worse at night, try sleeping in a semi-upright position.  Avoid cigarette smoke. If you smoke, quit smoking. If you need help quitting, ask your health care provider.  Avoid caffeine.  Avoid alcohol.  Rest as needed.  Contact a health care provider if:  You have new symptoms.  You cough up pus.  Your cough does not get better after 2-3 weeks, or your cough gets worse.  You cannot control your cough with suppressant medicines and you are losing sleep.  You develop pain that is getting worse or pain that is not  controlled with pain medicines.  You have a fever.  You have unexplained weight loss.  You have night sweats. Get help right away if:  You cough up blood.  You have difficulty breathing.  Your heartbeat is very fast. This information is not intended to replace advice given to you by your health care provider. Make sure you discuss any questions you have with your health care provider. Document Released: 09/29/2010 Document Revised: 09/08/2015 Document Reviewed: 06/09/2014 Elsevier Interactive Patient Education  2017 [employer].   IF you received an x-ray today, you will receive an invoice from [facility]. Please contact [facility] at [phone] with questions or concerns regarding your invoice.   IF you received labwork today, you will receive an invoice from [place]. Please contact [facility] at [phone] with questions or concerns regarding your invoice.   Our billing staff will not be able to assist you with questions regarding bills from these companies.  You will be contacted with the lab results as soon as they are available. The fastest way to get your results is to activate your My Chart account. Instructions are located on the last page of this paperwork. If you have not heard from [place] regarding the results in 2 weeks, please contact this office.      I personally performed the services described in this documentation, which was scribed in my presence. The recorded information has been reviewed and considered for accuracy and completeness, addended by me as needed, and agree with information above.  Signed,   Merri Ray, MD [facility].  12/06/16 9:36 AM

## 2016-12-03 NOTE — Patient Instructions (Addendum)
For cough, it could be due to the blood pressure medicine or other causes with history of smoking. Xray looked ok in hospital, try losartan once per day (stop lisinopril after tonight's dose) and recheck in next 1 week. Keep a record of your blood pressures outside of the office and bring them to the next office visit.    For abnormal taste, that may be related to the stroke. I would recommend discussing those symptoms with neurologist - you may need to contact them sooner than planned follow up.  You can try Ensure or Boost for now so you obtain calories. Make sure you drink plenty of fluids.   See information on stress below. I would like you to meet with counselor to discuss your stress, anxiety and depression issues. We can also try to start zoloft once per day for these symptoms.  If you have any suicidal thoughts - call 911.    Please call counselor for appointment.  Vivia Budge: [phone] Arvil Chaco: [phone]  Return to the clinic or go to the nearest emergency room if any of your symptoms worsen or new symptoms occur.   Stress and Stress Management Stress is a normal reaction to life events. It is what you feel when life demands more than you are used to or more than you can handle. Some stress can be useful. For example, the stress reaction can help you catch the last bus of the day, study for a test, or meet a deadline at work. But stress that occurs too often or for too long can cause problems. It can affect your emotional health and interfere with relationships and normal daily activities. Too much stress can weaken your immune system and increase your risk for physical illness. If you already have a medical problem, stress can make it worse. What are the causes? All sorts of life events may cause stress. An event that causes stress for one person may not be stressful for another person. Major life events commonly cause stress. These may be positive or negative. Examples include  losing your job, moving into a new home, getting married, having a baby, or losing a loved one. Less obvious life events may also cause stress, especially if they occur day after day or in combination. Examples include working long hours, driving in traffic, caring for children, being in debt, or being in a difficult relationship. What are the signs or symptoms? Stress may cause emotional symptoms including, the following:  Anxiety. This is feeling worried, afraid, on edge, overwhelmed, or out of control.  Anger. This is feeling irritated or impatient.  Depression. This is feeling sad, down, helpless, or guilty.  Difficulty focusing, remembering, or making decisions.  Stress may cause physical symptoms, including the following:  Aches and pains. These may affect your head, neck, back, stomach, or other areas of your body.  Tight muscles or clenched jaw.  Low energy or trouble sleeping.  Stress may cause unhealthy behaviors, including the following:  Eating to feel better (overeating) or skipping meals.  Sleeping too little, too much, or both.  Working too much or putting off tasks (procrastination).  Smoking, drinking alcohol, or using drugs to feel better.  How is this diagnosed? Stress is diagnosed through an assessment by your health care provider. Your health care provider will ask questions about your symptoms and any stressful life events.Your health care provider will also ask about your medical history and may order blood tests or other tests. Certain medical conditions and  medicine can cause physical symptoms similar to stress. Mental illness can cause emotional symptoms and unhealthy behaviors similar to stress. Your health care provider may refer you to a mental health professional for further evaluation. How is this treated? Stress management is the recommended treatment for stress.The goals of stress management are reducing stressful life events and coping with stress  in healthy ways. Techniques for reducing stressful life events include the following:  Stress identification. Self-monitor for stress and identify what causes stress for you. These skills may help you to avoid some stressful events.  Time management. Set your priorities, keep a calendar of events, and learn to say "no." These tools can help you avoid making too many commitments.  Techniques for coping with stress include the following:  Rethinking the problem. Try to think realistically about stressful events rather than ignoring them or overreacting. Try to find the positives in a stressful situation rather than focusing on the negatives.  Exercise. Physical exercise can release both physical and emotional tension. The key is to find a form of exercise you enjoy and do it regularly.  Relaxation techniques. These relax the body and mind. Examples include yoga, meditation, tai chi, biofeedback, deep breathing, progressive muscle relaxation, listening to music, being out in nature, journaling, and other hobbies. Again, the key is to find one or more that you enjoy and can do regularly.  Healthy lifestyle. Eat a balanced diet, get plenty of sleep, and do not smoke. Avoid using alcohol or drugs to relax.  Strong support network. Spend time with family, friends, or other people you enjoy being around.Express your feelings and talk things over with someone you trust.  Counseling or talktherapy with a mental health professional may be helpful if you are having difficulty managing stress on your own. Medicine is typically not recommended for the treatment of stress.Talk to your health care provider if you think you need medicine for symptoms of stress. Follow these instructions at home:  Keep all follow-up visits as directed by your health care provider.  Take all medicines as directed by your health care provider. Contact a health care provider if:  Your symptoms get worse or you start having  new symptoms.  You feel overwhelmed by your problems and can no longer manage them on your own. Get help right away if:  You feel like hurting yourself or someone else. This information is not intended to replace advice given to you by your health care provider. Make sure you discuss any questions you have with your health care provider. Document Released: 09/26/2000 Document Revised: 09/08/2015 Document Reviewed: 11/25/2012 Elsevier Interactive Patient Education  2017 [employer].    How to Take Your Blood Pressure You can take your blood pressure at home with a machine. You may need to check your blood pressure at home:  To check if you have high blood pressure (hypertension).  To check your blood pressure over time.  To make sure your blood pressure medicine is working.  Supplies needed: You will need a blood pressure machine, or monitor. You can buy one at a drugstore or online. When choosing one:  Choose one with an arm cuff.  Choose one that wraps around your upper arm. Only one finger should fit between your arm and the cuff.  Do not choose one that measures your blood pressure from your wrist or finger.  Your doctor can suggest a monitor. How to prepare Avoid these things for 30 minutes before checking your blood pressure:  Drinking caffeine.  Drinking alcohol.  Eating.  Smoking.  Exercising.  Five minutes before checking your blood pressure:  Pee.  Sit in a dining chair. Avoid sitting in a soft couch or armchair.  Be quiet. Do not talk.  How to take your blood pressure Follow the instructions that came with your machine. If you have a digital blood pressure monitor, these may be the instructions: 1. Sit up straight. 2. Place your feet on the floor. Do not cross your ankles or legs. 3. Rest your left arm at the level of your heart. You may rest it on a table, desk, or chair. 4. Pull up your shirt sleeve. 5. Wrap the blood pressure cuff around the  upper part of your left arm. The cuff should be 1 inch (2.5 cm) above your elbow. It is best to wrap the cuff around bare skin. 6. Fit the cuff snugly around your arm. You should be able to place only one finger between the cuff and your arm. 7. Put the cord inside the groove of your elbow. 8. Press the power button. 9. Sit quietly while the cuff fills with air and loses air. 10. Write down the numbers on the screen. 11. Wait 2-3 minutes and then repeat steps 1-10.  What do the numbers mean? Two numbers make up your blood pressure. The first number is called systolic pressure. The second is called diastolic pressure. An example of a blood pressure reading is "120 over 80" (or 120/80). If you are an adult and do not have a medical condition, use this guide to find out if your blood pressure is normal: Normal  First number: below 120.  Second number: below 80. Elevated  First number: 120-129.  Second number: below 80. Hypertension stage 1  First number: 130-139.  Second number: 80-89. Hypertension stage 2  First number: 140 or above.  Second number: 16 or above. Your blood pressure is above normal even if only the top or bottom number is above normal. Follow these instructions at home:  Check your blood pressure as often as your doctor tells you to.  Take your monitor to your next doctor's appointment. Your doctor will: ? Make sure you are using it correctly. ? Make sure it is working right.  Make sure you understand what your blood pressure numbers should be.  Tell your doctor if your medicines are causing side effects. Contact a doctor if:  Your blood pressure keeps being high. Get help right away if:  Your first blood pressure number is higher than 180.  Your second blood pressure number is higher than 120. This information is not intended to replace advice given to you by your health care provider. Make sure you discuss any questions you have with your health care  provider. Document Released: 03/15/2008 Document Revised: 02/29/2016 Document Reviewed: 09/09/2015 Elsevier Interactive Patient Education  2018 [employer].    Cough, Adult Coughing is a reflex that clears your throat and your airways. Coughing helps to heal and protect your lungs. It is normal to cough occasionally, but a cough that happens with other symptoms or lasts a long time may be a sign of a condition that needs treatment. A cough may last only 2-3 weeks (acute), or it may last longer than 8 weeks (chronic). What are the causes? Coughing is commonly caused by:  Breathing in substances that irritate your lungs.  A viral or bacterial respiratory infection.  Allergies.  Asthma.  Postnasal drip.  Smoking.  Acid  backing up from the stomach into the esophagus (gastroesophageal reflux).  Certain medicines.  Chronic lung problems, including COPD (or rarely, lung cancer).  Other medical conditions such as heart failure.  Follow these instructions at home: Pay attention to any changes in your symptoms. Take these actions to help with your discomfort:  Take medicines only as told by your health care provider. ? If you were prescribed an antibiotic medicine, take it as told by your health care provider. Do not stop taking the antibiotic even if you start to feel better. ? Talk with your health care provider before you take a cough suppressant medicine.  Drink enough fluid to keep your urine clear or pale yellow.  If the air is dry, use a cold steam vaporizer or humidifier in your bedroom or your home to help loosen secretions.  Avoid anything that causes you to cough at work or at home.  If your cough is worse at night, try sleeping in a semi-upright position.  Avoid cigarette smoke. If you smoke, quit smoking. If you need help quitting, ask your health care provider.  Avoid caffeine.  Avoid alcohol.  Rest as needed.  Contact a health care provider if:  You have  new symptoms.  You cough up pus.  Your cough does not get better after 2-3 weeks, or your cough gets worse.  You cannot control your cough with suppressant medicines and you are losing sleep.  You develop pain that is getting worse or pain that is not controlled with pain medicines.  You have a fever.  You have unexplained weight loss.  You have night sweats. Get help right away if:  You cough up blood.  You have difficulty breathing.  Your heartbeat is very fast. This information is not intended to replace advice given to you by your health care provider. Make sure you discuss any questions you have with your health care provider. Document Released: 09/29/2010 Document Revised: 09/08/2015 Document Reviewed: 06/09/2014 Elsevier Interactive Patient Education  2017 [employer].   IF you received an x-ray today, you will receive an invoice from [facility]. Please contact [facility] at [phone] with questions or concerns regarding your invoice.   IF you received labwork today, you will receive an invoice from [facility]. Please contact [facility] at [phone] with questions or concerns regarding your invoice.   Our billing staff will not be able to assist you with questions regarding bills from these companies.  You will be contacted with the lab results as soon as they are available. The fastest way to get your results is to activate your My Chart account. Instructions are located on the last page of this paperwork. If you have not heard from [place] regarding the results in 2 weeks, please contact this office.

## 2016-12-04 ENCOUNTER — Telehealth: Payer: Self-pay | Admitting: Family Medicine

## 2016-12-04 LAB — BASIC METABOLIC PANEL
BUN/Creatinine Ratio: 13 (ref 10–24)
BUN: 11 mg/dL (ref 8–27)
CALCIUM: 9.5 mg/dL (ref 8.6–10.2)
CO2: 19 mmol/L — AB (ref 20–29)
CREATININE: 0.87 mg/dL (ref 0.76–1.27)
Chloride: 100 mmol/L (ref 96–106)
GFR calc Af Amer: 108 mL/min/{1.73_m2} (ref >59)
GFR, EST NON AFRICAN AMERICAN: 94 mL/min/{1.73_m2} (ref >59)
GLUCOSE: 88 mg/dL (ref 65–99)
POTASSIUM: 4.4 mmol/L (ref 3.5–5.2)
Sodium: 133 mmol/L — ABNORMAL LOW (ref 134–144)

## 2016-12-04 NOTE — Telephone Encounter (Signed)
PATIENT STATES HE WAS IN TO SEE DR. GREENE Monday (12/03/16) AND HE PRESCRIBED HIM TO HAVE SERTRALINE (ZOLOFT) 50 MG. MR. Dralle SAID HE TOOK IT ABOUT 3:15pm YESTERDAY AND ABOUT 6:00pm THE DIARRHEA STARTED. HE SAID THE DIARRHEA CONTINUED ABOUT 6 HOURS AND HE COULD NOT SLEEP. HE SAID HE IS AFRAID TO TAKE IT TODAY. WHAT SHOULD HE DO? BEST PHONE [phone] (HOME) PHARMACY CHOICE IF NEEDED IS [facility] ON [address].  MBC

## 2016-12-05 NOTE — Telephone Encounter (Signed)
I spoke with patient and advised diarrhea is a common side effect to taking SSRI medications. Advised this will subside in 1 week and should be mild diarrhea. Suggested he take Imodium OTC. To help with symptoms.  Pt also reported he bought a new home blood pressure machine and BP was elevated today. Advised, he may be the placement. Suggested to bring machine at next week f/u visit and call our office if BP still elevated with symptoms.

## 2016-12-10 DIAGNOSIS — K080 Exfoliation of teeth due to systemic causes: Secondary | ICD-10-CM | POA: Diagnosis not present

## 2016-12-11 ENCOUNTER — Ambulatory Visit (INDEPENDENT_AMBULATORY_CARE_PROVIDER_SITE_OTHER): Payer: Federal, State, Local not specified - PPO | Admitting: Family Medicine

## 2016-12-11 ENCOUNTER — Encounter: Payer: Self-pay | Admitting: Family Medicine

## 2016-12-11 VITALS — BP 118/80 | HR 70 | Temp 98.8°F | Resp 16 | Ht 71.0 in | Wt 254.0 lb

## 2016-12-11 DIAGNOSIS — Z8673 Personal history of transient ischemic attack (TIA), and cerebral infarction without residual deficits: Secondary | ICD-10-CM | POA: Diagnosis not present

## 2016-12-11 DIAGNOSIS — F329 Major depressive disorder, single episode, unspecified: Secondary | ICD-10-CM

## 2016-12-11 DIAGNOSIS — E871 Hypo-osmolality and hyponatremia: Secondary | ICD-10-CM | POA: Diagnosis not present

## 2016-12-11 DIAGNOSIS — I10 Essential (primary) hypertension: Secondary | ICD-10-CM | POA: Diagnosis not present

## 2016-12-11 DIAGNOSIS — G4700 Insomnia, unspecified: Secondary | ICD-10-CM

## 2016-12-11 DIAGNOSIS — R634 Abnormal weight loss: Secondary | ICD-10-CM | POA: Diagnosis not present

## 2016-12-11 MED ORDER — TEMAZEPAM 30 MG PO CAPS
30.0000 mg | ORAL_CAPSULE | Freq: Every evening | ORAL | 1 refills | Status: DC | PRN
Start: 2016-12-11 — End: 2017-05-16

## 2016-12-11 NOTE — Patient Instructions (Addendum)
  Zoloft should help with anxiety and depression.  Try to take zoloft in the morning.  Ok to continue temazepam at bedtime if needed. See other information on sleep below. Sleep may also improve as you get used to Zoloft. Recheck in 3-4 weeks for this.   Continue losartan at same dose. Goal of under 130/80. I will repeat some blood tests, but those were only borderline at last visit. I suspect they will be okay.  Make sure you are eating quality foods - protein, complex carbohydrate with fiber, and vegetables.  I would like to keep an eye on the weight to make sure you are not losing weight too quick. Hopefully the taste buds will also improve.   Good job on quitting smoking and alcohol!      IF you received an x-ray today, you will receive an invoice from [facility]. Please contact [facility] at [phone] with questions or concerns regarding your invoice.   IF you received labwork today, you will receive an invoice from [place]. Please contact [facility] at [phone] with questions or concerns regarding your invoice.   Our billing staff will not be able to assist you with questions regarding bills from these companies.  You will be contacted with the lab results as soon as they are available. The fastest way to get your results is to activate your My Chart account. Instructions are located on the last page of this paperwork. If you have not heard from [place] regarding the results in 2 weeks, please contact this office.

## 2016-12-11 NOTE — Progress Notes (Signed)
Subjective:  By signing my name below, I, [name], attest that this documentation has been prepared under the direction and in the presence of Shade Flood, MD Electronically Signed: Charline Bills, ED Scribe 12/11/2016 at 1:39 PM.   Patient ID: Fred Davis, male    DOB: Mar 14, 1956, 61 y.o.   MRN: [id number]  Chief Complaint  Patient presents with  . Follow-up    HTN and Depression medication   HPI Fred Davis is a 61 y.o. male who presents to Primary Care at [facility] for a follow-up. Seen 8 days ago for hospital follow-up.  H/o CVA  R ICH at the R external capsule. R facial droop was improving at last visit. Having some difficulty eating due to decreased taste. Had lost 15 lbs over the past few weeks. Bland foods and nutritional shakes were discussed.   Wt Readings from Last 3 Encounters:  12/11/16 254 lb (115.2 kg)  12/03/16 264 lb (119.7 kg)  11/14/16 279 lb 5.2 oz (126.7 kg)   Weight Loss As above. He did have a h/o tobacco use but no active cardiopulmonary disease on CXR 8/4. Sodium was borderline low at 133 last visit.   Today, pt reports that he is eating but is aware that he was overweight prior to his stroke. States that he has been making healthier options; has eliminated carbs, sweets and consuming smaller portions. Reports that his taste buds are beginning to improve but red meats still taste a little salty.   HTN Controlled last visit but reported dry cough on ace inhibitor. Changed to Losartan 100 mg qd.   Pt has been compliant with medication and states that cough has subsided. Pt's home monitor averages 120-130/70-80. Denies light-headedness, dizziness, chest pain, sob.   Depression/Anxiety Previous alcohol use and smoker but had quit since stroke. Thought to have component of long-standing depression with adjustment disorder due to his heath and other family stressors. Started on Zoloft 50 g qd. Phone numbers for counselors provided.  Pt reports at  least 1-2 episodes of diarrhea after eating with taking Zoloft in mid afternoons. Also reports insomnia; states his mind is constantly racing at night. Pt states he had not take Restoril in months but took 1 last night and finally drifted off to sleep after 15 minutes. He states that he is "starting to feel a tiny bit like myself". Denies abdominal pain, over-eating, consuming excess caffeine, SI.   Patient Active Problem List   Diagnosis Date Noted  . ICH (intracerebral hemorrhage) (HCC) - right acute ICH at right external capsule 11/14/2016  . Left ankle swelling 12/18/2014  . Left ankle pain 12/18/2014   Past Medical History:  Diagnosis Date  . Hypertension    Past Surgical History:  Procedure Laterality Date  . HERNIA REPAIR     No Known Allergies Prior to Admission medications   Medication Sig Start Date End Date Taking? Authorizing Provider  diphenhydramine-acetaminophen (TYLENOL PM) 25-500 MG TABS Take 1 tablet by mouth at bedtime as needed.    [provider]  losartan (COZAAR) 100 MG tablet Take 1 tablet (100 mg total) by mouth daily. 12/03/16   Shade Flood, MD  sertraline (ZOLOFT) 50 MG tablet Take 1 tablet (50 mg total) by mouth daily. 12/03/16   Shade Flood, MD  temazepam (RESTORIL) 30 MG capsule Take 1 capsule (30 mg total) by mouth at bedtime as needed for sleep. 08/10/15   Trena Platt D, PA  tetrahydrozoline 0.05 % ophthalmic solution Place 2  drops into both eyes as needed.    [provider]   Social History   Social History  . Marital status: Divorced    Spouse name: N/A  . Number of children: N/A  . Years of education: N/A   Occupational History  . Not on file.   Social History Main Topics  . Smoking status: Light Tobacco Smoker    Types: Cigarettes  . Smokeless tobacco: Never Used  . Alcohol use 0.0 oz/week  . Drug use: No  . Sexual activity: Not on file   Other Topics Concern  . Not on file   Social History Narrative   . No narrative on file   Review of Systems  Constitutional: Negative for fatigue and unexpected weight change.  Eyes: Negative for visual disturbance.  Respiratory: Negative for cough, chest tightness and shortness of breath.   Cardiovascular: Negative for chest pain, palpitations and leg swelling.  Gastrointestinal: Positive for diarrhea. Negative for abdominal pain and blood in stool.  Neurological: Negative for dizziness, light-headedness and headaches.  Psychiatric/Behavioral: Positive for sleep disturbance. Negative for suicidal ideas.      Objective:   Physical Exam  Constitutional: He is oriented to person, place, and time. He appears well-developed and well-nourished.  HENT:  Head: Normocephalic and atraumatic.  No visible facial droop.  Eyes: Pupils are equal, round, and reactive to light. EOM are normal.  Neck: No JVD present. Carotid bruit is not present.  Cardiovascular: Normal rate, regular rhythm and normal heart sounds.   No murmur heard. Pulmonary/Chest: Effort normal and breath sounds normal. He has no rales.  Musculoskeletal: He exhibits no edema.  Neurological: He is alert and oriented to person, place, and time.  Equal strength face, legs. No pronator drift.  Skin: Skin is warm and dry.  Psychiatric: He has a normal mood and affect.  Vitals reviewed.  Vitals:   12/11/16 1323  BP: 118/80  Pulse: 70  Resp: 16  Temp: 98.8 F (37.1 C)  TempSrc: Oral  SpO2: 93%  Weight: 254 lb (115.2 kg)  Height: 5\' 11"  (1.803 m)      Assessment & Plan:   Fred Davis is a 61 y.o. male History of CVA (cerebrovascular accident) Essential hypertension - Plan: Comprehensive metabolic panel  - Stable blood pressure now on ARB. Cough has improved, could have been ACE inhibitor cough. Has had some improvement with taste buds, no focal deficits noted today including no apparent facial droop.  Reactive depression Insomnia, unspecified type - Plan: temazepam (RESTORIL) 30  MG capsule  - Some diarrhea with Zoloft, but that has decreased to once per day. Should continue to improve, but if diarrhea persists beyond 2 weeks, may need to select different agent. Continue Zoloft same dose, Restoril at night if needed, but can also change Zoloft to morning dosing to see if that causes less insomnia. Recheck in one month  Loss of weight - Plan: Comprehensive metabolic panel  - May be component of improved diet, decrease alcohol.  Healthy diet discussed, recheck in one month,  Hyponatremia - Plan: Comprehensive metabolic panel  - Borderline hyponatremia, check CMP.  Meds ordered this encounter  Medications  . temazepam (RESTORIL) 30 MG capsule    Sig: Take 1 capsule (30 mg total) by mouth at bedtime as needed for sleep.    Dispense:  30 capsule    Refill:  1   Patient Instructions    Zoloft should help with anxiety and depression.  Try to take zoloft in  the morning.  Ok to continue temazepam at bedtime if needed. See other information on sleep below. Sleep may also improve as you get used to Zoloft. Recheck in 3-4 weeks for this.   Continue losartan at same dose. Goal of under 130/80. I will repeat some blood tests, but those were only borderline at last visit. I suspect they will be okay.  Make sure you are eating quality foods - protein, complex carbohydrate with fiber, and vegetables.  I would like to keep an eye on the weight to make sure you are not losing weight too quick. Hopefully the taste buds will also improve.   Good job on quitting smoking and alcohol!      IF you received an x-ray today, you will receive an invoice from [facility]. Please contact [facility] at [phone] with questions or concerns regarding your invoice.   IF you received labwork today, you will receive an invoice from [place]. Please contact [facility] at [phone] with questions or concerns regarding your invoice.   Our billing staff will not be able to  assist you with questions regarding bills from these companies.  You will be contacted with the lab results as soon as they are available. The fastest way to get your results is to activate your My Chart account. Instructions are located on the last page of this paperwork. If you have not heard from [place] regarding the results in 2 weeks, please contact this office.       I personally performed the services described in this documentation, which was scribed in my presence. The recorded information has been reviewed and considered for accuracy and completeness, addended by me as needed, and agree with information above.  Signed,   Meredith Staggers, MD Primary Care at [facility].  12/11/16 4:45 PM

## 2016-12-12 LAB — COMPREHENSIVE METABOLIC PANEL
A/G RATIO: 1.3 (ref 1.2–2.2)
ALBUMIN: 4.2 g/dL (ref 3.6–4.8)
ALT: 28 IU/L (ref 0–44)
AST: 27 IU/L (ref 0–40)
Alkaline Phosphatase: 88 IU/L (ref 39–117)
BILIRUBIN TOTAL: 0.3 mg/dL (ref 0.0–1.2)
BUN / CREAT RATIO: 14 (ref 10–24)
BUN: 14 mg/dL (ref 8–27)
CALCIUM: 9.4 mg/dL (ref 8.6–10.2)
CHLORIDE: 102 mmol/L (ref 96–106)
CO2: 17 mmol/L — ABNORMAL LOW (ref 20–29)
Creatinine, Ser: 1.02 mg/dL (ref 0.76–1.27)
GFR, EST AFRICAN AMERICAN: 92 mL/min/{1.73_m2} (ref >59)
GFR, EST NON AFRICAN AMERICAN: 80 mL/min/{1.73_m2} (ref >59)
Globulin, Total: 3.2 g/dL (ref 1.5–4.5)
Glucose: 82 mg/dL (ref 65–99)
POTASSIUM: 4.3 mmol/L (ref 3.5–5.2)
Sodium: 139 mmol/L (ref 134–144)
TOTAL PROTEIN: 7.4 g/dL (ref 6.0–8.5)

## 2016-12-16 ENCOUNTER — Other Ambulatory Visit: Payer: Self-pay | Admitting: Internal Medicine

## 2016-12-18 ENCOUNTER — Other Ambulatory Visit: Payer: Self-pay | Admitting: Family Medicine

## 2017-01-07 ENCOUNTER — Encounter: Payer: Self-pay | Admitting: Neurology

## 2017-01-07 ENCOUNTER — Ambulatory Visit (INDEPENDENT_AMBULATORY_CARE_PROVIDER_SITE_OTHER): Payer: Federal, State, Local not specified - PPO | Admitting: Neurology

## 2017-01-07 VITALS — BP 144/95 | HR 46 | Ht 71.0 in | Wt 255.4 lb

## 2017-01-07 DIAGNOSIS — I619 Nontraumatic intracerebral hemorrhage, unspecified: Secondary | ICD-10-CM | POA: Diagnosis not present

## 2017-01-07 NOTE — Progress Notes (Signed)
[facility]    Provider:  Dr Lucia Gaskins Referring Provider: Shade Flood, MD Primary Care Physician:  Shade Flood, MD  CC:  Stroke follow up  HPI:  Fred Davis is a 61 y.o. male here as a referral from Dr. Neva Seat for stroke follow up. Past medical history of tobacco use, overweight previous to stroke, he has eliminated carbs and watching his portions, hypertension, depression and anxiety, previous alcohol use. CT of the head showed a lentiform nucleus hemorrhage on the right. He was diagnosed with a hypertensive hemorrhage. He is here alone. He is doing well. He ws on his computer, couldn't use his right arm, fingers started getting numb, left foot started getting weak, he dropped the aspirin bottle and called 911. Most symptoms has resolved, stopped smoking and lost weight. He is mowing his yard by himself.   Reviewed notes, labs and imaging from outside physicians, which showed:  At the time of his stroke he had not been on hypertensive medication, he was watching TV in the evening and noticed that his left side wasn't working quite right, he called 911 and brought in as a code stroke. NIH score was 5. Initial exam showed mild left hemiparesis but symmetric sensation. CT of the head showed a lentiform nucleus hemorrhage on the right. He was diagnosed with a hypertensive hemorrhage. Patient reportedly had right mild facial droop with mild right upper extremity weakness and pronator drift when seen by stroke team. Right acute intracranial hemorrhage at the right external capsule. Left sided weakness improved while in the hospital. Patient was on Cardene for blood pressure. Repeat head CT was stable. CTA of the head did not show any cause for bleeding. Likely due to uncontrolled hypertension. Was advised to quit smoking cigarettes and marijuana, advised to drink no more than 2 drinks a day.  LDL 98, hemoglobin A1c 5.7  Ct Angio Head and neck W Or Wo  Contrast 11/15/2016 IMPRESSION: 1. No visible vascular abnormality to explain the patient's hemorrhage. 2. Bilateral vertebral origin atherosclerotic stenosis, high-grade on the right and mild moderate on the left. 3. Cervical carotid and intracranial atherosclerosis without significant stenosis.   Ct Head Code Stroke W/o Cm 11/14/2016 IMPRESSION: 1. 2.5 x 1.4 x 2.3 cm (estimated volume 4.0 mL) acute intraparenchymal hemorrhage centered at the right lentiform nucleus. No significant regional mass effect. 2. Underlying chronic microvascular ischemic disease.   2D Echo 11/15/2016 ef 60-65%, normal wall motion   Review of Systems: Patient complains of symptoms per HPI as well as the following symptoms: weight loss, insomnia. Pertinent negatives and positives per HPI. All others negative.   Social History   Social History  . Marital status: Divorced    Spouse name: N/A  . Number of children: N/A  . Years of education: N/A   Occupational History  . Not on file.   Social History Main Topics  . Smoking status: Former Smoker    Types: Cigarettes    Quit date: 11/14/2016  . Smokeless tobacco: Never Used  . Alcohol use 1.8 oz/week    3 Cans of beer per week  . Drug use: No  . Sexual activity: Not on file   Other Topics Concern  . Not on file   Social History Narrative  . No narrative on file    Family History  Problem Relation Age of Onset  . Multiple sclerosis Mother   . Stroke Father     Past Medical History:  Diagnosis Date  . Hypertension  Past Surgical History:  Procedure Laterality Date  . HERNIA REPAIR      Current Outpatient Prescriptions  Medication Sig Dispense Refill  . diphenhydramine-acetaminophen (TYLENOL PM) 25-500 MG TABS Take 1 tablet by mouth at bedtime as needed.    Marland Kitchen losartan (COZAAR) 100 MG tablet Take 1 tablet (100 mg total) by mouth daily. 30 tablet 1  . sertraline (ZOLOFT) 50 MG tablet Take 1 tablet (50 mg total) by mouth daily. 30 tablet 1   . temazepam (RESTORIL) 30 MG capsule Take 1 capsule (30 mg total) by mouth at bedtime as needed for sleep. 30 capsule 1  . tetrahydrozoline 0.05 % ophthalmic solution Place 2 drops into both eyes as needed.     No current facility-administered medications for this visit.     Allergies as of 01/07/2017 - Review Complete 01/07/2017  Allergen Reaction Noted  . Lisinopril Cough 01/07/2017    Vitals: BP (!) 144/95   Pulse (!) 46   Ht  (1.803 m)   Wt 255 lb 6.4 oz (115.8 kg)   BMI 35.62 kg/m  Last Weight:  Wt Readings from Last 1 Encounters:  01/07/17 255 lb 6.4 oz (115.8 kg)   Last Height:   Ht Readings from Last 1 Encounters:  01/07/17  (1.803 m)    Physical exam: Exam: Gen: NAD, conversant, well nourised, obese, well groomed                     CV: RRR, no MRG. No Carotid Bruits. No peripheral edema, warm, nontender Eyes: Conjunctivae clear without exudates or hemorrhage  Neuro: Detailed Neurologic Exam  Speech:    Speech is normal; fluent and spontaneous with normal comprehension.  Cognition:    The patient is oriented to person, place, and time;     recent and remote memory intact;     language fluent;     normal attention, concentration,     fund of knowledge Cranial Nerves:    The pupils are equal, round, and reactive to light. The fundi are normal and spontaneous venous pulsations are present. Visual fields are full to finger confrontation. Extraocular movements are intact. Trigeminal sensation is intact and the muscles of mastication are normal. The face is symmetric. The palate elevates in the midline. Hearing intact. Voice is normal. Shoulder shrug is normal. The tongue has normal motion without fasciculations.   Coordination:    Some minimal left hand decrease fine motor movements  Gait:    Heel-toe and tandem gait are normal.   Motor Observation:    No asymmetry, no atrophy, and no involuntary movements noted. Tone:    Normal muscle tone.     Posture:    Posture is normal. normal erect    Strength:    Strength is V/V in the upper and lower limbs.      Sensation: intact to LT     Reflex Exam:  DTR's:    Deep tendon reflexes in the upper and lower extremities are symmetrical bilaterally.   Toes:    The toes are downgoing bilaterally.   Clonus:    Clonus is absent.      Assessment/Plan:  61 year old with cerebral hemorrhage due to uncontrolled HTN   I had a long d/w patient about his recent stroke, risk for recurrent stroke/TIAs, personally independently reviewed imaging studies and stroke evaluation results and answered questions.Maintain strict control of hypertension with blood pressure goal below 130/90, diabetes with hemoglobin A1c goal below 6.5%  and lipids with LDL cholesterol goal below 70 mg/dL.. I also advised the patient to eat a healthy diet with plenty of whole grains, cereals, fruits and vegetables, exercise regularly and maintain ideal body weight   Recommend asa  daily for ischemic stroke prevention given his vascular risk factors.  Followup in the future as needed.  Cc: Dr. Marta Lamas, MD  [facility] [address] [place], [place] [address]  Phone [phone] Fax [phone]

## 2017-01-07 NOTE — Patient Instructions (Addendum)
Remember to drink plenty of fluid, eat healthy meals and do not skip any meals. Try to eat protein with a every meal and eat a healthy snack such as fruit or nuts in between meals. Try to keep a regular sleep-wake schedule and try to exercise daily, particularly in the form of walking, 20-30 minutes a day, if you can.   As far as your medications are concerned, I would like to suggest: Suggest ASA  daily for stroke prevention given history of smoking. Consider Wellbutrin if need anything for stroke prevention.  I would like to see you back in , sooner if we need to. Please call us with any interim questions, concerns, problems, updates or refill requests.   Please also call us for any test results so we can go over those with you on the phone.  My clinical assistant and will answer any of your questions and relay your messages to me and also relay most of my messages to you.   Our phone number is [phone]. We also have an after hours call service for urgent matters and there is a physician on-call for urgent questions. For any emergencies you know to call 911 or go to the nearest emergency room

## 2017-01-10 ENCOUNTER — Ambulatory Visit (INDEPENDENT_AMBULATORY_CARE_PROVIDER_SITE_OTHER): Payer: Federal, State, Local not specified - PPO | Admitting: Family Medicine

## 2017-01-10 ENCOUNTER — Encounter: Payer: Self-pay | Admitting: Family Medicine

## 2017-01-10 VITALS — BP 118/73 | HR 52 | Temp 98.4°F | Resp 16 | Ht 71.0 in | Wt 252.8 lb

## 2017-01-10 DIAGNOSIS — I10 Essential (primary) hypertension: Secondary | ICD-10-CM | POA: Diagnosis not present

## 2017-01-10 DIAGNOSIS — Z8673 Personal history of transient ischemic attack (TIA), and cerebral infarction without residual deficits: Secondary | ICD-10-CM | POA: Diagnosis not present

## 2017-01-10 DIAGNOSIS — R634 Abnormal weight loss: Secondary | ICD-10-CM

## 2017-01-10 DIAGNOSIS — F4320 Adjustment disorder, unspecified: Secondary | ICD-10-CM

## 2017-01-10 MED ORDER — LOSARTAN POTASSIUM 100 MG PO TABS: 100.0000 mg | ORAL_TABLET | Freq: Every day | ORAL | 1 refills | Status: DC

## 2017-01-10 NOTE — Progress Notes (Signed)
Subjective:  By signing my name below, I, Stann Ore, attest that this documentation has been prepared under the direction and in the presence of Meredith Staggers, MD. Electronically Signed: Stann Ore, Scribe. 01/10/2017 , 4:11 PM .  Patient was seen in Room 11 .   Patient ID: Fred Davis, male    DOB: 1955-09-17, 61 y.o.   MRN: [id number] Chief Complaint  Patient presents with  . Follow-up    CVA    HPI Fred Davis is a 61 y.o. male  Patient is here for follow up on CVA. He was last seen on Aug 28th. He had a right ICH stroke. He was hospitalized from Aug 1st through 6th, thought due to untreated HTN. His BP was improving at last visit, diet was improving and had some improvement in his taste sensation.   He's been checking his BP at home, running average 125/75, with highest at 140/90 and lowest at 100/60. He denies lightheadedness, dizziness, or weakness.   Reactive depression and insomnia See prior visits. He was continued on Zoloft. Recommended he take Zoloft in the mornings, and Restoril at night if needed.   Patient states he things have been doing pretty well. He was happy last week that he was able to do yard work on his own yard, instead of paying someone else. He saw his neurologist, Dr. Lucia Gaskins, 3 days ago.   He's been taking Zoloft for the past month, but has noticed sleeping poorly. He was informed by pharmacist and suggested to try weaning off of it. He denies depression or feeling down. He is still taking Restoril occasionally and taking Zoloft every other day.   Weight loss Thought due to improved diet and decreased alcohol; and some decreased PO intake with taste sensation. He hasn't smoked since before the stroke.   Wt Readings from Last 3 Encounters:  01/10/17 252 lb 12.8 oz (114.7 kg)  01/07/17 255 lb 6.4 oz (115.8 kg)  12/11/16 254 lb (115.2 kg)    Patient Active Problem List   Diagnosis Date Noted  . ICH (intracerebral hemorrhage) (HCC) - right  acute ICH at right external capsule 11/14/2016  . Left ankle swelling 12/18/2014  . Left ankle pain 12/18/2014   Past Medical History:  Diagnosis Date  . Hypertension    Past Surgical History:  Procedure Laterality Date  . HERNIA REPAIR     Allergies  Allergen Reactions  . Lisinopril Cough   Prior to Admission medications   Medication Sig Start Date End Date Taking? Authorizing Provider  diphenhydramine-acetaminophen (TYLENOL PM) 25-500 MG TABS Take 1 tablet by mouth at bedtime as needed.   Yes [provider]  losartan (COZAAR) 100 MG tablet Take 1 tablet (100 mg total) by mouth daily. 12/03/16  Yes Shade Flood, MD  sertraline (ZOLOFT) 50 MG tablet Take 1 tablet (50 mg total) by mouth daily. 12/03/16  Yes Shade Flood, MD  temazepam (RESTORIL) 30 MG capsule Take 1 capsule (30 mg total) by mouth at bedtime as needed for sleep. 12/11/16  Yes Shade Flood, MD  tetrahydrozoline 0.05 % ophthalmic solution Place 2 drops into both eyes as needed.   Yes [provider]   Social History   Social History  . Marital status: Divorced    Spouse name: N/A  . Number of children: N/A  . Years of education: N/A   Occupational History  . Not on file.   Social History Main Topics  . Smoking status: Former Smoker  Types: Cigarettes    Quit date: 11/14/2016  . Smokeless tobacco: Never Used  . Alcohol use 1.8 oz/week    3 Cans of beer per week  . Drug use: No  . Sexual activity: Not on file   Other Topics Concern  . Not on file   Social History Narrative  . No narrative on file   Review of Systems  Constitutional: Negative for fatigue and unexpected weight change.  Eyes: Negative for visual disturbance.  Respiratory: Negative for cough, chest tightness and shortness of breath.   Cardiovascular: Negative for chest pain, palpitations and leg swelling.  Gastrointestinal: Negative for abdominal pain and blood in stool.  Neurological: Negative for  dizziness, light-headedness and headaches.       Objective:   Physical Exam  Constitutional: He is oriented to person, place, and time. He appears well-developed and well-nourished.  HENT:  Head: Normocephalic and atraumatic.  Eyes: Pupils are equal, round, and reactive to light. EOM are normal.  Neck: No JVD present. Carotid bruit is not present.  Cardiovascular: Normal rate, regular rhythm and normal heart sounds.   No murmur heard. Pulmonary/Chest: Effort normal and breath sounds normal. He has no rales.  Musculoskeletal: He exhibits no edema.  Neurological: He is alert and oriented to person, place, and time.  Skin: Skin is warm and dry.  Psychiatric: He has a normal mood and affect.  Vitals reviewed.   Vitals:   01/10/17 1523  BP: 118/73  Pulse: (!) 52  Resp: 16  Temp: 98.4 F (36.9 C)  SpO2: 95%  Weight: 252 lb 12.8 oz (114.7 kg)  Height:  (1.803 m)      Assessment & Plan:   Fred Davis is a 61 y.o. male Adjustment disorder, unspecified type  - Improved, denies recent depression/anxiety symptoms. Suspect it was primarily adjustment to recent CVA. He has also decreased alcohol intake, and mood has improved.  - Taper off Zoloft, then consider Wellbutrin if return of depression symptoms.  Essential hypertension - Plan: losartan (COZAAR) 100 MG tablet  -Stable, continues losartan same dose.  Loss of weight  -Suspect this has been with positive changes in diet and alcohol decrease.  History of CVA (cerebrovascular accident)  -Doing well, without apparent residual deficits at this point. Continue routine follow-up with neurology, goal blood pressure less than 130/80, LDL less than 70. Plan for repeat lipid testing next visit in a few months.  Meds ordered this encounter  Medications  . losartan (COZAAR) 100 MG tablet    Sig: Take 1 tablet (100 mg total) by mouth daily.    Dispense:  90 tablet    Refill:  1   Patient Instructions   No change in blood  pressure medications for now. Goal blood pressure under 130/80.  Okay to cut back on Zoloft to every third day for 1 week to 10 days, then stop it altogether. If any increased depression symptoms off of that medication, please follow-up with me to discuss other medications including possibly Wellbutrin. Activity/exercise as tolerated can help with mood symptoms as well.   Follow-up with me in 3 months, sooner if worse.   IF you received an x-ray today, you will receive an invoice from [facility]. Please contact [facility] at [phone] with questions or concerns regarding your invoice.   IF you received labwork today, you will receive an invoice from [place]. Please contact [facility] at [phone] with questions or concerns regarding your invoice.   Our billing staff will not  be able to assist you with questions regarding bills from these companies.  You will be contacted with the lab results as soon as they are available. The fastest way to get your results is to activate your My Chart account. Instructions are located on the last page of this paperwork. If you have not heard from [place] regarding the results in 2 weeks, please contact this office.       I personally performed the services described in this documentation, which was scribed in my presence. The recorded information has been reviewed and considered for accuracy and completeness, addended by me as needed, and agree with information above.  Signed,   Meredith Staggers, MD Primary Care at [facility].  01/11/17 1:55 PM

## 2017-01-10 NOTE — Patient Instructions (Addendum)
No change in blood pressure medications for now. Goal blood pressure under 130/80.  Okay to cut back on Zoloft to every third day for 1 week to 10 days, then stop it altogether. If any increased depression symptoms off of that medication, please follow-up with me to discuss other medications including possibly Wellbutrin. Activity/exercise as tolerated can help with mood symptoms as well.   Follow-up with me in 3 months, sooner if worse.   IF you received an x-ray today, you will receive an invoice from [facility]. Please contact [facility] at [phone] with questions or concerns regarding your invoice.   IF you received labwork today, you will receive an invoice from [place]. Please contact [facility] at [phone] with questions or concerns regarding your invoice.   Our billing staff will not be able to assist you with questions regarding bills from these companies.  You will be contacted with the lab results as soon as they are available. The fastest way to get your results is to activate your My Chart account. Instructions are located on the last page of this paperwork. If you have not heard from [place] regarding the results in 2 weeks, please contact this office.

## 2017-01-28 ENCOUNTER — Other Ambulatory Visit: Payer: Self-pay | Admitting: Family Medicine

## 2017-01-28 DIAGNOSIS — F418 Other specified anxiety disorders: Secondary | ICD-10-CM

## 2017-04-11 ENCOUNTER — Ambulatory Visit: Payer: Self-pay | Admitting: Family Medicine

## 2017-05-13 DIAGNOSIS — I10 Essential (primary) hypertension: Secondary | ICD-10-CM | POA: Diagnosis not present

## 2017-05-13 DIAGNOSIS — F419 Anxiety disorder, unspecified: Secondary | ICD-10-CM | POA: Diagnosis not present

## 2017-05-14 ENCOUNTER — Ambulatory Visit: Payer: Self-pay

## 2017-05-14 DIAGNOSIS — I4510 Unspecified right bundle-branch block: Secondary | ICD-10-CM | POA: Diagnosis not present

## 2017-05-14 NOTE — Telephone Encounter (Signed)
Agree with plan to follow up to discuss further.

## 2017-05-14 NOTE — Telephone Encounter (Signed)
Pls advise.  
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
## 2017-05-16 ENCOUNTER — Other Ambulatory Visit: Payer: Self-pay

## 2017-05-16 ENCOUNTER — Encounter: Payer: Self-pay | Admitting: Family Medicine

## 2017-05-16 ENCOUNTER — Ambulatory Visit: Payer: Federal, State, Local not specified - PPO | Admitting: Family Medicine

## 2017-05-16 VITALS — BP 130/83 | HR 68 | Temp 97.7°F | Resp 16 | Ht 71.0 in | Wt 242.2 lb

## 2017-05-16 DIAGNOSIS — I10 Essential (primary) hypertension: Secondary | ICD-10-CM

## 2017-05-16 DIAGNOSIS — R7989 Other specified abnormal findings of blood chemistry: Secondary | ICD-10-CM

## 2017-05-16 DIAGNOSIS — I160 Hypertensive urgency: Secondary | ICD-10-CM | POA: Diagnosis not present

## 2017-05-16 DIAGNOSIS — F418 Other specified anxiety disorders: Secondary | ICD-10-CM

## 2017-05-16 MED ORDER — CLONAZEPAM 0.5 MG PO TABS: 0.5000 mg | ORAL_TABLET | Freq: Two times a day (BID) | ORAL | 1 refills | Status: DC | PRN

## 2017-05-16 MED ORDER — HYDROCHLOROTHIAZIDE 12.5 MG PO TABS
12.5000 mg | ORAL_TABLET | Freq: Every day | ORAL | 1 refills | Status: DC
Start: 2017-05-16 — End: 2017-11-08

## 2017-05-16 NOTE — Progress Notes (Signed)
Subjective:  .By signing my name below, I, [name], attest that this documentation has been prepared under the direction and in the presence of [name], MD Electronically Signed: Ladene Artist, ED Scribe 05/16/2017 at 10:45 AM.   Patient ID: Fred Davis, male    DOB: 1955-12-04, 62 y.o.   MRN: [id number]  Chief Complaint  Patient presents with  . Anxiety    patient went to ER with a severe anxiety attack,   . Hypertension    patient states that B/P was elevated due to anxiety   HPI Fred Davis is a 62 y.o. male who presents to Primary Care at [facility] for f/u.  H/o Adjustment Disorder and Anxiety Symptoms Last seen in Sept. R ICH with hemorrhagic stroke 11/2016 thought to be due to untreated HTN. He had been improving his control of HTN but had significant anxiety and depression, trouble sleeping prior situation stressors, family stressors but still thought to have long-standing depression symptoms. Started on Zoloft in Aug 50 mg qd and Restoril at night. Was improving in Sept with more activity. Was trying to wean off Zoloft to every other day as thought that was worsening his sleep issue. Recommended to continue tapering off Zoloft and  Then poss Wellbutrin if return of depression. Seen in ER 2 days ago with increasing anxiety. Concerned for elevated BP when becoming more anxious. Had taken extra dose of Losartan that day. BP was significantly elevated at 193/113, rechecked 165/99, down to 144/83 as he calmed in the ER.  Pt states that he thought he was doing well until he got into an argument with his brother and father 3 wks ago. Also reports that his father fell last wk and he called protective services who told him that the conversation was confidential so he discussed family stressors, however, they came to his father's home and revealed everything that they had discussed. States he now has 2 brothers who are upset with him. Reports that he has been having increased  anxiety over the past few wks which has increased his BP. Also noted elevated BP 3 days ago after taking his father to visit his mother in a nursing home, eating a sub and experiencing indigestion; BP was 225/126 at that time. Denies missed doses of Losartan. States BP was good and averaged 110/70 prior to the past 3 wks when feeling more anxious. Denies any other symptoms with elevated BP. No family h/o pheochromocytoma. He has not taken Zoloft since Sept. States his sleep improved when he stopped Zoloft but it has gotten worse again. Last took Restoril last night which he usually takes once-twice/wk. States his mind is constantly racing. He has not met with a therapist/counselor in several yrs. 1 alcoholic beverage/day. Denies illicit drug use, SI/HI.  HTN Lab Results  Component Value Date   CREATININE 1.02 12/11/2016   BP Readings from Last 3 Encounters:  05/16/17 130/83  01/10/17 118/73  01/07/17 (!) 144/95  Continued Losartan 100 mg qd in Sept. Recent home readings of 146-193/86-113. Reports that he has previously taken HCTZ which worked well for him.Denies HA, lightheadedness, dizziness, diaphoresis, cp, palpitations. Elevated TSH in Aug at 15.13 with slightly low free T4 at that time.  Patient Active Problem List   Diagnosis Date Noted  . ICH (intracerebral hemorrhage) (HCC) - right acute ICH at right external capsule 11/14/2016  . Left ankle swelling 12/18/2014  . Left ankle pain 12/18/2014   Past Medical History:  Diagnosis Date  . Hypertension  Past Surgical History:  Procedure Laterality Date  . HERNIA REPAIR     Allergies  Allergen Reactions  . Lisinopril Cough   Prior to Admission medications   Medication Sig Start Date End Date Taking? Authorizing Provider  diphenhydramine-acetaminophen (TYLENOL PM) 25-500 MG TABS Take 1 tablet by mouth at bedtime as needed.    [provider]  losartan (COZAAR) 100 MG tablet Take 1 tablet (100 mg total) by mouth daily.  01/10/17   [name], MD  sertraline (ZOLOFT) 50 MG tablet TAKE 1 TABLET BY MOUTH EVERY DAY 01/30/17   [name], MD  temazepam (RESTORIL) 30 MG capsule Take 1 capsule (30 mg total) by mouth at bedtime as needed for sleep. 12/11/16   [name], MD  tetrahydrozoline 0.05 % ophthalmic solution Place 2 drops into both eyes as needed.    [provider]   Social History   Socioeconomic History  . Marital status: Divorced    Spouse name: Not on file  . Number of children: Not on file  . Years of education: Not on file  . Highest education level: Not on file  Social Needs  . Financial resource strain: Not on file  . Food insecurity - worry: Not on file  . Food insecurity - inability: Not on file  . Transportation needs - medical: Not on file  . Transportation needs - non-medical: Not on file  Occupational History  . Not on file  Tobacco Use  . Smoking status: Former Smoker    Types: Cigarettes    Last attempt to quit: 11/14/2016    Years since quitting: 0.5  . Smokeless tobacco: Never Used  Substance and Sexual Activity  . Alcohol use: Yes    Alcohol/week: 1.8 oz    Types: 3 Cans of beer per week  . Drug use: No  . Sexual activity: Not on file  Other Topics Concern  . Not on file  Social History Narrative  . Not on file   Review of Systems  Constitutional: Negative for diaphoresis, fatigue and unexpected weight change.  Eyes: Negative for visual disturbance.  Respiratory: Negative for cough, chest tightness and shortness of breath.   Cardiovascular: Negative for chest pain, palpitations and leg swelling.  Gastrointestinal: Negative for abdominal pain and blood in stool.  Neurological: Negative for dizziness, light-headedness and headaches.  Psychiatric/Behavioral: Positive for sleep disturbance. Negative for suicidal ideas. The patient is nervous/anxious.       Objective:   Physical Exam  Constitutional: He is oriented to person, place, and  time. He appears well-developed and well-nourished.  HENT:  Head: Normocephalic and atraumatic.  Eyes: EOM are normal. Pupils are equal, round, and reactive to light.  Neck: No JVD present. Carotid bruit is not present.  Cardiovascular: Normal rate, regular rhythm and normal heart sounds.  No murmur heard. Pulmonary/Chest: Effort normal and breath sounds normal. He has no rales.  Musculoskeletal: He exhibits no edema.  Neurological: He is alert and oriented to person, place, and time.  Skin: Skin is warm and dry.  Psychiatric: He has a normal mood and affect.  Vitals reviewed.  Vitals:   05/16/17 1013  BP: 130/83  Pulse: 68  Resp: 16  Temp: 97.7 F (36.5 C)  TempSrc: Oral  SpO2: 97%  Weight: 242 lb 3.2 oz (109.9 kg)  Height: '5\' 11"'$  (1.803 m)      Assessment & Plan:    Fred Davis is a 62 y.o. male Depression with anxiety -  Plan: clonazePAM (KLONOPIN) 0.5 MG tablet  - initially thought to have some component of adjustment disorder with prior depression/anxiety. Had been doing well off medications but still some anxiety symptoms now with worsening symptoms resulting in an emergency room evaluation with spike in blood pressure, and family stressors.  - Options for SSRIs discussed, he would like to restart Zoloft that he has at home and watch for return of worsening sleep symptoms. If that occurs, or manic symptoms, consider bipolar in differential  - change Restoril to Klonopin twice a day for now. Discussed short-term use plan  - Advised to call counselor and can check with insurance for coverage in [facility], local numbers given  -Recheck 2 weeks  Essential hypertension - Plan: Metanephrines, plasma, hydrochlorothiazide (HYDRODIURIL) 12.5 MG tablet, Comprehensive metabolic panel Hypertensive urgency - Plan: Metanephrines, plasma, TSH + free T4 Elevated TSH - Plan: TSH + free T4  -Better numbers in office then on home readings, but significant elevation in ER few days ago.  Possible slight HA, but no apparent diaphoresis. With that high of reading, will check into secondary HTN cause.   - check TSH with prior abnormal reading  - check plasma metanephrine to screen for pheochromocytoma  - check CMP  - add HCTZ 12.'5mg'$ , continue losartan '100mg'$  QD. recheck 2 weeks.   Meds ordered this encounter  Medications  . hydrochlorothiazide (HYDRODIURIL) 12.5 MG tablet    Sig: Take 1 tablet (12.5 mg total) by mouth daily.    Dispense:  90 tablet    Refill:  1  . clonazePAM (KLONOPIN) 0.5 MG tablet    Sig: Take 1 tablet (0.5 mg total) by mouth 2 (two) times daily as needed for anxiety.    Dispense:  20 tablet    Refill:  1   Patient Instructions   It may be helpful to meet with a therapist to discuss ways to manage depression and anxiety as well as discussing managing family stressors. Call to schedule appointment. Check with your insurance to see who is in network in [facility] Here are two local numbers.  Vivia Budge: [phone] [place] [phone] See other information on stress management below.  Restart Zoloft  - one each morning, and Klonopin up to twice per day if needed. If sleep worsens on Zoloft, will need to look at other meds or cause. Recheck in 2 weeks.   For blood pressure - start HCTZ once per day, continue same dose of losartan for now. Keep a record of your blood pressures outside of the office and bring them to the next office visit. I will check some other blood tests for blood pressure and thyroid.     Living With Anxiety After being diagnosed with an anxiety disorder, you may be relieved to know why you have felt or behaved a certain way. It is natural to also feel overwhelmed about the treatment ahead and what it will mean for your life. With care and support, you can manage this condition and recover from it. How to cope with anxiety Dealing with stress Stress is your body's reaction to life changes and events, both good and bad. Stress can  last just a few hours or it can be ongoing. Stress can play a major role in anxiety, so it is important to learn both how to cope with stress and how to think about it differently. Talk with your health care provider or a counselor to learn more about stress reduction. He or she may suggest some  stress reduction techniques, such as:  Music therapy. This can include creating or listening to music that you enjoy and that inspires you.  Mindfulness-based meditation. This involves being aware of your normal breaths, rather than trying to control your breathing. It can be done while sitting or walking.  Centering prayer. This is a kind of meditation that involves focusing on a word, phrase, or sacred image that is meaningful to you and that brings you peace.  Deep breathing. To do this, expand your stomach and inhale slowly through your nose. Hold your breath for 3-5 seconds. Then exhale slowly, allowing your stomach muscles to relax.  Self-talk. This is a skill where you identify thought patterns that lead to anxiety reactions and correct those thoughts.  Muscle relaxation. This involves tensing muscles then relaxing them.  Choose a stress reduction technique that fits your lifestyle and personality. Stress reduction techniques take time and practice. Set aside 5-15 minutes a day to do them. Therapists can offer training in these techniques. The training may be covered by some insurance plans. Other things you can do to manage stress include:  Keeping a stress diary. This can help you learn what triggers your stress and ways to control your response.  Thinking about how you respond to certain situations. You may not be able to control everything, but you can control your reaction.  Making time for activities that help you relax, and not feeling guilty about spending your time in this way.  Therapy combined with coping and stress-reduction skills provides the best chance for successful  treatment. Medicines Medicines can help ease symptoms. Medicines for anxiety include:  Anti-anxiety drugs.  Antidepressants.  Beta-blockers.  Medicines may be used as the main treatment for anxiety disorder, along with therapy, or if other treatments are not working. Medicines should be prescribed by a health care provider. Relationships Relationships can play a big part in helping you recover. Try to spend more time connecting with trusted friends and family members. Consider going to couples counseling, taking family education classes, or going to family therapy. Therapy can help you and others better understand the condition. How to recognize changes in your condition Everyone has a different response to treatment for anxiety. Recovery from anxiety happens when symptoms decrease and stop interfering with your daily activities at home or work. This may mean that you will start to:  Have better concentration and focus.  Sleep better.  Be less irritable.  Have more energy.  Have improved memory.  It is important to recognize when your condition is getting worse. Contact your health care provider if your symptoms interfere with home or work and you do not feel like your condition is improving. Where to find help and support: You can get help and support from these sources:  Self-help groups.  Online and [employer].  A trusted spiritual leader.  Couples counseling.  Family education classes.  Family therapy.  Follow these instructions at home:  Eat a healthy diet that includes plenty of vegetables, fruits, whole grains, low-fat dairy products, and lean protein. Do not eat a lot of foods that are high in solid fats, added sugars, or salt.  Exercise. Most adults should do the following: ? Exercise for at least 150 minutes each week. The exercise should increase your heart rate and make you sweat (moderate-intensity exercise). ? Strengthening exercises at least  twice a week.  Cut down on caffeine, tobacco, alcohol, and other potentially harmful substances.  Get the right amount and  quality of sleep. Most adults need 7-9 hours of sleep each night.  Make choices that simplify your life.  Take over-the-counter and prescription medicines only as told by your health care provider.  Avoid caffeine, alcohol, and certain over-the-counter cold medicines. These may make you feel worse. Ask your pharmacist which medicines to avoid.  Keep all follow-up visits as told by your health care provider. This is important. Questions to ask your health care provider  Would I benefit from therapy?  How often should I follow up with a health care provider?  How long do I need to take medicine?  Are there any long-term side effects of my medicine?  Are there any alternatives to taking medicine? Contact a health care provider if:  You have a hard time staying focused or finishing daily tasks.  You spend many hours a day feeling worried about everyday life.  You become exhausted by worry.  You start to have headaches, feel tense, or have nausea.  You urinate more than normal.  You have diarrhea. Get help right away if:  You have a racing heart and shortness of breath.  You have thoughts of hurting yourself or others. If you ever feel like you may hurt yourself or others, or have thoughts about taking your own life, get help right away. You can go to your nearest emergency department or call:  Your local emergency services (911 in the [place]  A suicide crisis helpline, such as the [place] at (404)010-5250. This is open 24-hours a day.  Summary  Taking steps to deal with stress can help calm you.  Medicines cannot cure anxiety disorders, but they can help ease symptoms.  Family, friends, and partners can play a big part in helping you recover from an anxiety disorder. This information is not intended to replace  advice given to you by your health care provider. Make sure you discuss any questions you have with your health care provider. Document Released: 03/27/2016 Document Revised: 03/27/2016 Document Reviewed: 03/27/2016 Elsevier Interactive Patient Education  2018 [employer].     IF you received an x-ray today, you will receive an invoice from [facility]. Please contact [facility] at [phone] with questions or concerns regarding your invoice.   IF you received labwork today, you will receive an invoice from [place]. Please contact [facility] at [phone] with questions or concerns regarding your invoice.   Our billing staff will not be able to assist you with questions regarding bills from these companies.  You will be contacted with the lab results as soon as they are available. The fastest way to get your results is to activate your My Chart account. Instructions are located on the last page of this paperwork. If you have not heard from [place] regarding the results in 2 weeks, please contact this office.      I personally performed the services described in this documentation, which was scribed in my presence. The recorded information has been reviewed and considered for accuracy and completeness, addended by me as needed, and agree with information above.  Signed,   Merri Ray, MD [facility] at [place].  05/16/17 11:13 AM

## 2017-05-16 NOTE — Patient Instructions (Addendum)
It may be helpful to meet with a therapist to discuss ways to manage depression and anxiety as well as discussing managing family stressors. Call to schedule appointment. Check with your insurance to see who is in network in [facility]. Here are two local numbers.  Karmen Bongo: [phone] [place] [phone] See other information on stress management below.  Restart Zoloft  - one each morning, and Klonopin up to twice per day if needed. If sleep worsens on Zoloft, will need to look at other meds or cause. Recheck in 2 weeks.   For blood pressure - start HCTZ once per day, continue same dose of losartan for now. Keep a record of your blood pressures outside of the office and bring them to the next office visit. I will check some other blood tests for blood pressure and thyroid.     Living With Anxiety After being diagnosed with an anxiety disorder, you may be relieved to know why you have felt or behaved a certain way. It is natural to also feel overwhelmed about the treatment ahead and what it will mean for your life. With care and support, you can manage this condition and recover from it. How to cope with anxiety Dealing with stress Stress is your body's reaction to life changes and events, both good and bad. Stress can last just a few hours or it can be ongoing. Stress can play a major role in anxiety, so it is important to learn both how to cope with stress and how to think about it differently. Talk with your health care provider or a counselor to learn more about stress reduction. He or she may suggest some stress reduction techniques, such as:  Music therapy. This can include creating or listening to music that you enjoy and that inspires you.  Mindfulness-based meditation. This involves being aware of your normal breaths, rather than trying to control your breathing. It can be done while sitting or walking.  Centering prayer. This is a kind of meditation that involves focusing on a  word, phrase, or sacred image that is meaningful to you and that brings you peace.  Deep breathing. To do this, expand your stomach and inhale slowly through your nose. Hold your breath for 3-5 seconds. Then exhale slowly, allowing your stomach muscles to relax.  Self-talk. This is a skill where you identify thought patterns that lead to anxiety reactions and correct those thoughts.  Muscle relaxation. This involves tensing muscles then relaxing them.  Choose a stress reduction technique that fits your lifestyle and personality. Stress reduction techniques take time and practice. Set aside 5-15 minutes a day to do them. Therapists can offer training in these techniques. The training may be covered by some insurance plans. Other things you can do to manage stress include:  Keeping a stress diary. This can help you learn what triggers your stress and ways to control your response.  Thinking about how you respond to certain situations. You may not be able to control everything, but you can control your reaction.  Making time for activities that help you relax, and not feeling guilty about spending your time in this way.  Therapy combined with coping and stress-reduction skills provides the best chance for successful treatment. Medicines Medicines can help ease symptoms. Medicines for anxiety include:  Anti-anxiety drugs.  Antidepressants.  Beta-blockers.  Medicines may be used as the main treatment for anxiety disorder, along with therapy, or if other treatments are not working. Medicines should be prescribed  by a health care provider. Relationships Relationships can play a big part in helping you recover. Try to spend more time connecting with trusted friends and family members. Consider going to couples counseling, taking family education classes, or going to family therapy. Therapy can help you and others better understand the condition. How to recognize changes in your  condition Everyone has a different response to treatment for anxiety. Recovery from anxiety happens when symptoms decrease and stop interfering with your daily activities at home or work. This may mean that you will start to:  Have better concentration and focus.  Sleep better.  Be less irritable.  Have more energy.  Have improved memory.  It is important to recognize when your condition is getting worse. Contact your health care provider if your symptoms interfere with home or work and you do not feel like your condition is improving. Where to find help and support: You can get help and support from these sources:  Self-help groups.  Online and [employer].  A trusted spiritual leader.  Couples counseling.  Family education classes.  Family therapy.  Follow these instructions at home:  Eat a healthy diet that includes plenty of vegetables, fruits, whole grains, low-fat dairy products, and lean protein. Do not eat a lot of foods that are high in solid fats, added sugars, or salt.  Exercise. Most adults should do the following: ? Exercise for at least 150 minutes each week. The exercise should increase your heart rate and make you sweat (moderate-intensity exercise). ? Strengthening exercises at least twice a week.  Cut down on caffeine, tobacco, alcohol, and other potentially harmful substances.  Get the right amount and quality of sleep. Most adults need 7-9 hours of sleep each night.  Make choices that simplify your life.  Take over-the-counter and prescription medicines only as told by your health care provider.  Avoid caffeine, alcohol, and certain over-the-counter cold medicines. These may make you feel worse. Ask your pharmacist which medicines to avoid.  Keep all follow-up visits as told by your health care provider. This is important. Questions to ask your health care provider  Would I benefit from therapy?  How often should I follow up with a  health care provider?  How long do I need to take medicine?  Are there any long-term side effects of my medicine?  Are there any alternatives to taking medicine? Contact a health care provider if:  You have a hard time staying focused or finishing daily tasks.  You spend many hours a day feeling worried about everyday life.  You become exhausted by worry.  You start to have headaches, feel tense, or have nausea.  You urinate more than normal.  You have diarrhea. Get help right away if:  You have a racing heart and shortness of breath.  You have thoughts of hurting yourself or others. If you ever feel like you may hurt yourself or others, or have thoughts about taking your own life, get help right away. You can go to your nearest emergency department or call:  Your local emergency services (911 in the [place]  A suicide crisis helpline, such as the National Suicide Prevention Lifeline at 705-243-7513. This is open 24-hours a day.  Summary  Taking steps to deal with stress can help calm you.  Medicines cannot cure anxiety disorders, but they can help ease symptoms.  Family, friends, and partners can play a big part in helping you recover from an anxiety disorder. This information is not  intended to replace advice given to you by your health care provider. Make sure you discuss any questions you have with your health care provider. Document Released: 03/27/2016 Document Revised: 03/27/2016 Document Reviewed: 03/27/2016 Elsevier Interactive Patient Education  2018 [employer]     IF you received an x-ray today, you will receive an invoice from [facility]. Please contact [facility] at [phone] with questions or concerns regarding your invoice.   IF you received labwork today, you will receive an invoice from [facility]. Please contact [facility] at [phone] with questions or concerns regarding your invoice.   Our billing staff will not be able  to assist you with questions regarding bills from these companies.  You will be contacted with the lab results as soon as they are available. The fastest way to get your results is to activate your My Chart account. Instructions are located on the last page of this paperwork. If you have not heard from us regarding the results in 2 weeks, please contact this office.

## 2017-05-20 LAB — COMPREHENSIVE METABOLIC PANEL
A/G RATIO: 1.6 (ref 1.2–2.2)
ALT: 16 IU/L (ref 0–44)
AST: 17 IU/L (ref 0–40)
Albumin: 4.7 g/dL (ref 3.6–4.8)
Alkaline Phosphatase: 81 IU/L (ref 39–117)
BILIRUBIN TOTAL: 0.5 mg/dL (ref 0.0–1.2)
BUN/Creatinine Ratio: 17 (ref 10–24)
BUN: 18 mg/dL (ref 8–27)
CHLORIDE: 102 mmol/L (ref 96–106)
CO2: 22 mmol/L (ref 20–29)
Calcium: 9.8 mg/dL (ref 8.6–10.2)
Creatinine, Ser: 1.05 mg/dL (ref 0.76–1.27)
GFR calc Af Amer: 88 mL/min/{1.73_m2} (ref >59)
GFR calc non Af Amer: 76 mL/min/{1.73_m2} (ref >59)
Globulin, Total: 3 g/dL (ref 1.5–4.5)
Glucose: 90 mg/dL (ref 65–99)
POTASSIUM: 4.7 mmol/L (ref 3.5–5.2)
Sodium: 138 mmol/L (ref 134–144)
Total Protein: 7.7 g/dL (ref 6.0–8.5)

## 2017-05-20 LAB — METANEPHRINES, PLASMA
METANEPHRINE FREE: 55 pg/mL (ref 0–62)
NORMETANEPHRINE FREE: 177 pg/mL — AB (ref 0–145)

## 2017-05-20 LAB — TSH+FREE T4
FREE T4: 0.52 ng/dL — AB (ref 0.82–1.77)
TSH: 30 u[IU]/mL — ABNORMAL HIGH (ref 0.450–4.500)

## 2017-05-30 ENCOUNTER — Ambulatory Visit: Payer: Federal, State, Local not specified - PPO | Admitting: Family Medicine

## 2017-05-30 ENCOUNTER — Encounter: Payer: Self-pay | Admitting: Family Medicine

## 2017-05-30 VITALS — BP 116/76 | HR 60 | Temp 98.0°F | Resp 18 | Ht 71.0 in | Wt 238.0 lb

## 2017-05-30 DIAGNOSIS — I10 Essential (primary) hypertension: Secondary | ICD-10-CM

## 2017-05-30 DIAGNOSIS — E039 Hypothyroidism, unspecified: Secondary | ICD-10-CM

## 2017-05-30 DIAGNOSIS — R7989 Other specified abnormal findings of blood chemistry: Secondary | ICD-10-CM

## 2017-05-30 DIAGNOSIS — F411 Generalized anxiety disorder: Secondary | ICD-10-CM

## 2017-05-30 MED ORDER — LEVOTHYROXINE SODIUM 50 MCG PO TABS: 50.0000 ug | ORAL_TABLET | Freq: Every day | ORAL | 1 refills | Status: DC

## 2017-05-30 NOTE — Patient Instructions (Addendum)
Continue Zoloft same dose for now. Recheck in 1 month to see if dose changes needed.  I think that meeting with therapist will help with anxiety and worry.  I would still recommend meeting with them.  Vivia Budge: [phone] Arvil Chaco: [phone]  For thyroid, I will start you on a low dose of thyroid medicine but would like you to meet with endocrinology to discuss those results and mild elevation of one of the hormones that can be associated with blood pressure spikes.   Follow up with me in 1 month.  Keep working on Child psychotherapist and relaxation techniques - see info below.   Return to the clinic or go to the nearest emergency room if any of your symptoms worsen or new symptoms occur.   Hypothyroidism Hypothyroidism is a disorder of the thyroid. The thyroid is a large gland that is located in the lower front of the neck. The thyroid releases hormones that control how the body works. With hypothyroidism, the thyroid does not make enough of these hormones. What are the causes? Causes of hypothyroidism may include:  Viral infections.  Pregnancy.  Your own defense system (immune system) attacking your thyroid.  Certain medicines.  Birth defects.  Past radiation treatments to your head or neck.  Past treatment with radioactive iodine.  Past surgical removal of part or all of your thyroid.  Problems with the gland that is located in the center of your brain (pituitary).  What are the signs or symptoms? Signs and symptoms of hypothyroidism may include:  Feeling as though you have no energy (lethargy).  Inability to tolerate cold.  Weight gain that is not explained by a change in diet or exercise habits.  Dry skin.  Coarse hair.  Menstrual irregularity.  Slowing of thought processes.  Constipation.  Sadness or depression.  How is this diagnosed? Your health care provider may diagnose hypothyroidism with blood tests and ultrasound tests. How is this  treated? Hypothyroidism is treated with medicine that replaces the hormones that your body does not make. After you begin treatment, it may take several weeks for symptoms to go away. Follow these instructions at home:  Take medicines only as directed by your health care provider.  If you start taking any new medicines, tell your health care provider.  Keep all follow-up visits as directed by your health care provider. This is important. As your condition improves, your dosage needs may change. You will need to have blood tests regularly so that your health care provider can watch your condition. Contact a health care provider if:  Your symptoms do not get better with treatment.  You are taking thyroid replacement medicine and: ? You sweat excessively. ? You have tremors. ? You feel anxious. ? You lose weight rapidly. ? You cannot tolerate heat. ? You have emotional swings. ? You have diarrhea. ? You feel weak. Get help right away if:  You develop chest pain.  You develop an irregular heartbeat.  You develop a rapid heartbeat. This information is not intended to replace advice given to you by your health care provider. Make sure you discuss any questions you have with your health care provider. Document Released: 04/02/2005 Document Revised: 09/08/2015 Document Reviewed: 08/18/2013 Elsevier Interactive Patient Education  2018 [employer] and Stress Management Stress is a normal reaction to life events. It is what you feel when life demands more than you are used to or more than you can handle. Some stress can be useful. For  example, the stress reaction can help you catch the last bus of the day, study for a test, or meet a deadline at work. But stress that occurs too often or for too long can cause problems. It can affect your emotional health and interfere with relationships and normal daily activities. Too much stress can weaken your immune system and increase your risk  for physical illness. If you already have a medical problem, stress can make it worse. What are the causes? All sorts of life events may cause stress. An event that causes stress for one person may not be stressful for another person. Major life events commonly cause stress. These may be positive or negative. Examples include losing your job, moving into a new home, getting married, having a baby, or losing a loved one. Less obvious life events may also cause stress, especially if they occur day after day or in combination. Examples include working long hours, driving in traffic, caring for children, being in debt, or being in a difficult relationship. What are the signs or symptoms? Stress may cause emotional symptoms including, the following:  Anxiety. This is feeling worried, afraid, on edge, overwhelmed, or out of control.  Anger. This is feeling irritated or impatient.  Depression. This is feeling sad, down, helpless, or guilty.  Difficulty focusing, remembering, or making decisions.  Stress may cause physical symptoms, including the following:  Aches and pains. These may affect your head, neck, back, stomach, or other areas of your body.  Tight muscles or clenched jaw.  Low energy or trouble sleeping.  Stress may cause unhealthy behaviors, including the following:  Eating to feel better (overeating) or skipping meals.  Sleeping too little, too much, or both.  Working too much or putting off tasks (procrastination).  Smoking, drinking alcohol, or using drugs to feel better.  How is this diagnosed? Stress is diagnosed through an assessment by your health care provider. Your health care provider will ask questions about your symptoms and any stressful life events.Your health care provider will also ask about your medical history and may order blood tests or other tests. Certain medical conditions and medicine can cause physical symptoms similar to stress. Mental illness can cause  emotional symptoms and unhealthy behaviors similar to stress. Your health care provider may refer you to a mental health professional for further evaluation. How is this treated? Stress management is the recommended treatment for stress.The goals of stress management are reducing stressful life events and coping with stress in healthy ways. Techniques for reducing stressful life events include the following:  Stress identification. Self-monitor for stress and identify what causes stress for you. These skills may help you to avoid some stressful events.  Time management. Set your priorities, keep a calendar of events, and learn to say "no." These tools can help you avoid making too many commitments.  Techniques for coping with stress include the following:  Rethinking the problem. Try to think realistically about stressful events rather than ignoring them or overreacting. Try to find the positives in a stressful situation rather than focusing on the negatives.  Exercise. Physical exercise can release both physical and emotional tension. The key is to find a form of exercise you enjoy and do it regularly.  Relaxation techniques. These relax the body and mind. Examples include yoga, meditation, tai chi, biofeedback, deep breathing, progressive muscle relaxation, listening to music, being out in nature, journaling, and other hobbies. Again, the key is to find one or more that you enjoy and  can do regularly.  Healthy lifestyle. Eat a balanced diet, get plenty of sleep, and do not smoke. Avoid using alcohol or drugs to relax.  Strong support network. Spend time with family, friends, or other people you enjoy being around.Express your feelings and talk things over with someone you trust.  Counseling or talktherapy with a mental health professional may be helpful if you are having difficulty managing stress on your own. Medicine is typically not recommended for the treatment of stress.Talk to your  health care provider if you think you need medicine for symptoms of stress. Follow these instructions at home:  Keep all follow-up visits as directed by your health care provider.  Take all medicines as directed by your health care provider. Contact a health care provider if:  Your symptoms get worse or you start having new symptoms.  You feel overwhelmed by your problems and can no longer manage them on your own. Get help right away if:  You feel like hurting yourself or someone else. This information is not intended to replace advice given to you by your health care provider. Make sure you discuss any questions you have with your health care provider. Document Released: 09/26/2000 Document Revised: 09/08/2015 Document Reviewed: 11/25/2012 Elsevier Interactive Patient Education  2017 [employer].    IF you received an x-ray today, you will receive an invoice from [facility]. Please contact [facility] at [phone] with questions or concerns regarding your invoice.   IF you received labwork today, you will receive an invoice from [facility]. Please contact [facility] at [phone] with questions or concerns regarding your invoice.   Our billing staff will not be able to assist you with questions regarding bills from these companies.  You will be contacted with the lab results as soon as they are available. The fastest way to get your results is to activate your My Chart account. Instructions are located on the last page of this paperwork. If you have not heard from [place] regarding the results in 2 weeks, please contact this office.

## 2017-05-30 NOTE — Progress Notes (Signed)
I,Fred Davis [employer] as a Education administrator for Fred Agreste, MD.,have documented all relevant documentation on the behalf of Fred Agreste, MD,as directed by  Fred Agreste, MD while in the presence of Fred Agreste, MD.  Subjective:    Patient ID: Fred Davis, male    DOB: June 22, 1955, 62 y.o.   MRN: [id number]  Chief Complaint  Patient presents with  . Hypertension    Follow up  . Anxiety    Follow up, Patient states he is not depressed    HPI  [name] is a 62 y.o. male who presents to Primary Care at [facility] for a follow-up of his HTN and Anxiety. He has stopped smoking cigarettes status post his intracerebral hemorrhage.   HTN Having high readings at home while only on losartan. History of intracerebral hemorrhage. Significant elevation in the ED a few days prior, improved in office. Added HCTZ 12.'5mg'$  QD. Elevated TSH of 30 with free t4 of 5.2 and also mildly elevated plasma metanephrines.    He has been monitoring his blood pressure at home. Readings for the last week: 108/66 up to 155/92. He endorses occasional dizziness, but denies CP and SOB.   Elevated TSH, as above TSC August 2018 elevated 15.134 with free t4 of 0.49  Anxiety Suspected adjustment disorder with underlying depression and anxiety. He was doing okay, but his symptoms have recently worsened. Family stressors. Restarted Zoloft and was prescribed klonopin short term.  Recommends meeting with counselor.   Has been taking Zoloft every day. He feels anxiety over appointments, and has not made an appointment with a counselor. He has been sleeping better, but reports that he worries too much. He feels seeing a therapist is a waste of time, but he hasn't completely disregarded the idea of going. He last took klonopin about 5-6 days ago. Pt denies SI and HI.   Depression screen [facility] 2/9 05/30/2017 05/16/2017 01/10/2017 12/11/2016 12/03/2016  Decreased Interest 0 0 0 0 3  Down, Depressed, Hopeless 0 0 0 0 3  PHQ  - 2 Score 0 0 0 0 6  Altered sleeping - - - - 0  Tired, decreased energy - - - - 1  Change in appetite - - - - 3  Feeling bad or failure about yourself  - - - - 0  Trouble concentrating - - - - 2  Moving slowly or fidgety/restless - - - - 0  Suicidal thoughts - - - - 0  PHQ-9 Score - - - - 12  Difficult doing work/chores - - - - Somewhat difficult      Patient Active Problem List   Diagnosis Date Noted  . ICH (intracerebral hemorrhage) (HCC) - right acute ICH at right external capsule 11/14/2016  . Left ankle swelling 12/18/2014  . Left ankle pain 12/18/2014   Past Medical History:  Diagnosis Date  . Hypertension    Past Surgical History:  Procedure Laterality Date  . HERNIA REPAIR     Allergies  Allergen Reactions  . Lisinopril Cough   Prior to Admission medications   Medication Sig Start Date End Date Taking? Authorizing Provider  clonazePAM (KLONOPIN) 0.5 MG tablet Take 1 tablet (0.5 mg total) by mouth 2 (two) times daily as needed for anxiety. 05/16/17  Yes Fred Agreste, MD  diphenhydramine-acetaminophen (TYLENOL PM) 25-500 MG TABS Take 1 tablet by mouth at bedtime as needed.   Yes [provider]  hydrochlorothiazide (HYDRODIURIL) 12.5 MG tablet Take 1 tablet (12.5 mg total)  by mouth daily. 05/16/17  Yes Fred Agreste, MD  losartan (COZAAR) 100 MG tablet Take 1 tablet (100 mg total) by mouth daily. 01/10/17  Yes Fred Agreste, MD  sertraline (ZOLOFT) 50 MG tablet TAKE 1 TABLET BY MOUTH EVERY DAY 01/30/17  Yes Fred Agreste, MD  tetrahydrozoline 0.05 % ophthalmic solution Place 2 drops into both eyes as needed.   Yes [provider]   Social History   Socioeconomic History  . Marital status: Divorced    Spouse name: Not on file  . Number of children: Not on file  . Years of education: Not on file  . Highest education level: Not on file  Social Needs  . Financial resource strain: Not on file  . Food insecurity - worry: Not on file    . Food insecurity - inability: Not on file  . Transportation needs - medical: Not on file  . Transportation needs - non-medical: Not on file  Occupational History  . Not on file  Tobacco Use  . Smoking status: Former Smoker    Types: Cigarettes    Last attempt to quit: 11/14/2016    Years since quitting: 0.5  . Smokeless tobacco: Never Used  Substance and Sexual Activity  . Alcohol use: Yes    Alcohol/week: 1.8 oz    Types: 3 Cans of beer per week  . Drug use: No  . Sexual activity: Not on file  Other Topics Concern  . Not on file  Social History Narrative  . Not on file    Review of Systems  Respiratory: Negative for shortness of breath.   Cardiovascular: Negative for chest pain.  Neurological: Positive for dizziness.  Psychiatric/Behavioral: Negative for suicidal ideas. The patient is nervous/anxious.    13 point ROS reviewed and negative unless otherwise noted above.      Objective:   Physical Exam  Constitutional: He is oriented to person, place, and time. He appears well-developed and well-nourished. No distress.  HENT:  Head: Normocephalic and atraumatic.  Cardiovascular: Normal rate.  Pulmonary/Chest: Effort normal.  Neurological: He is alert and oriented to person, place, and time.  Psychiatric: He has a normal mood and affect.  Vitals reviewed.   Vitals:   05/30/17 1341  BP: 116/76  Pulse: 60  Resp: 18  Temp: 98 F (36.7 C)  TempSrc: Oral  SpO2: 98%  Weight: 238 lb (108 kg)  Height: '5\' 11"'$  (1.803 m)       Assessment & Plan:    [name] is a 62 y.o. male Essential hypertension - Plan: Ambulatory referral to Endocrinology  - improving. However secondary HTN possible. New diagnosis of hypothyroidism as below and with spikes of blood pressure, including prior intracerebral hemorrhage, metanephrines were checked. Borderline elevation.  -Continue same dose of HCTZ, losartan for now, refer to endocrinology to determine further  testing.  -Treatment of hypothyroidism as below. Recheck 1 month  Anxiety state  -Suspect some component of anxiety/generalized anxiety with excessive worry and secondary adjustment disorder after change in health/intracerebral hemorrhage. Also with recent social stressors.  -Minimal change with 50 mg Zoloft, but has only been taking that for a few weeks. Recommended counseling/therapist again, as well as relaxation/stress management techniques on handout.  -Hypothyroidism as below, treatment may also improve mood  Hypothyroidism, unspecified type - Plan: levothyroxine (SYNTHROID, LEVOTHROID) 50 MCG tablet, Ambulatory referral to Endocrinology  -Start Synthroid 50 MCG daily, refer to endocrinology given elevated metanephrines as well. Declined repeat thyroid testing today, but  did have elevated TSH a few months back, higher recently.   Elevated plasma metanephrines - Plan: Ambulatory referral to Endocrinology  -As above, slight elevation. Previous spikes in blood pressure, differential includes pheochromocytoma  -Refer to endocrinology determine further testing including possible 24-hour testing.  Meds ordered this encounter  Medications  . levothyroxine (SYNTHROID, LEVOTHROID) 50 MCG tablet    Sig: Take 1 tablet (50 mcg total) by mouth daily.    Dispense:  30 tablet    Refill:  1   Patient Instructions   Continue Zoloft same dose for now. Recheck in 1 month to see if dose changes needed.  I think that meeting with therapist will help with anxiety and worry.  I would still recommend meeting with them.  Vivia Budge: [phone] Arvil Chaco: [phone]  For thyroid, I will start you on a low dose of thyroid medicine but would like you to meet with endocrinology to discuss those results and mild elevation of one of the hormones that can be associated with blood pressure spikes.   Follow up with me in 1 month.  Keep working on Child psychotherapist and relaxation techniques - see info below.    Return to the clinic or go to the nearest emergency room if any of your symptoms worsen or new symptoms occur.   Hypothyroidism Hypothyroidism is a disorder of the thyroid. The thyroid is a large gland that is located in the lower front of the neck. The thyroid releases hormones that control how the body works. With hypothyroidism, the thyroid does not make enough of these hormones. What are the causes? Causes of hypothyroidism may include:  Viral infections.  Pregnancy.  Your own defense system (immune system) attacking your thyroid.  Certain medicines.  Birth defects.  Past radiation treatments to your head or neck.  Past treatment with radioactive iodine.  Past surgical removal of part or all of your thyroid.  Problems with the gland that is located in the center of your brain (pituitary).  What are the signs or symptoms? Signs and symptoms of hypothyroidism may include:  Feeling as though you have no energy (lethargy).  Inability to tolerate cold.  Weight gain that is not explained by a change in diet or exercise habits.  Dry skin.  Coarse hair.  Menstrual irregularity.  Slowing of thought processes.  Constipation.  Sadness or depression.  How is this diagnosed? Your health care provider may diagnose hypothyroidism with blood tests and ultrasound tests. How is this treated? Hypothyroidism is treated with medicine that replaces the hormones that your body does not make. After you begin treatment, it may take several weeks for symptoms to go away. Follow these instructions at home:  Take medicines only as directed by your health care provider.  If you start taking any new medicines, tell your health care provider.  Keep all follow-up visits as directed by your health care provider. This is important. As your condition improves, your dosage needs may change. You will need to have blood tests regularly so that your health care provider can watch your  condition. Contact a health care provider if:  Your symptoms do not get better with treatment.  You are taking thyroid replacement medicine and: ? You sweat excessively. ? You have tremors. ? You feel anxious. ? You lose weight rapidly. ? You cannot tolerate heat. ? You have emotional swings. ? You have diarrhea. ? You feel weak. Get help right away if:  You develop chest pain.  You develop an  irregular heartbeat.  You develop a rapid heartbeat. This information is not intended to replace advice given to you by your health care provider. Make sure you discuss any questions you have with your health care provider. Document Released: 04/02/2005 Document Revised: 09/08/2015 Document Reviewed: 08/18/2013 Elsevier Interactive Patient Education  2018 [employer] and Stress Management Stress is a normal reaction to life events. It is what you feel when life demands more than you are used to or more than you can handle. Some stress can be useful. For example, the stress reaction can help you catch the last bus of the day, study for a test, or meet a deadline at work. But stress that occurs too often or for too long can cause problems. It can affect your emotional health and interfere with relationships and normal daily activities. Too much stress can weaken your immune system and increase your risk for physical illness. If you already have a medical problem, stress can make it worse. What are the causes? All sorts of life events may cause stress. An event that causes stress for one person may not be stressful for another person. Major life events commonly cause stress. These may be positive or negative. Examples include losing your job, moving into a new home, getting married, having a baby, or losing a loved one. Less obvious life events may also cause stress, especially if they occur day after day or in combination. Examples include working long hours, driving in traffic, caring for  children, being in debt, or being in a difficult relationship. What are the signs or symptoms? Stress may cause emotional symptoms including, the following:  Anxiety. This is feeling worried, afraid, on edge, overwhelmed, or out of control.  Anger. This is feeling irritated or impatient.  Depression. This is feeling sad, down, helpless, or guilty.  Difficulty focusing, remembering, or making decisions.  Stress may cause physical symptoms, including the following:  Aches and pains. These may affect your head, neck, back, stomach, or other areas of your body.  Tight muscles or clenched jaw.  Low energy or trouble sleeping.  Stress may cause unhealthy behaviors, including the following:  Eating to feel better (overeating) or skipping meals.  Sleeping too little, too much, or both.  Working too much or putting off tasks (procrastination).  Smoking, drinking alcohol, or using drugs to feel better.  How is this diagnosed? Stress is diagnosed through an assessment by your health care provider. Your health care provider will ask questions about your symptoms and any stressful life events.Your health care provider will also ask about your medical history and may order blood tests or other tests. Certain medical conditions and medicine can cause physical symptoms similar to stress. Mental illness can cause emotional symptoms and unhealthy behaviors similar to stress. Your health care provider may refer you to a mental health professional for further evaluation. How is this treated? Stress management is the recommended treatment for stress.The goals of stress management are reducing stressful life events and coping with stress in healthy ways. Techniques for reducing stressful life events include the following:  Stress identification. Self-monitor for stress and identify what causes stress for you. These skills may help you to avoid some stressful events.  Time management. Set your  priorities, keep a calendar of events, and learn to say "no." These tools can help you avoid making too many commitments.  Techniques for coping with stress include the following:  Rethinking the problem. Try to think realistically about stressful  events rather than ignoring them or overreacting. Try to find the positives in a stressful situation rather than focusing on the negatives.  Exercise. Physical exercise can release both physical and emotional tension. The key is to find a form of exercise you enjoy and do it regularly.  Relaxation techniques. These relax the body and mind. Examples include yoga, meditation, tai chi, biofeedback, deep breathing, progressive muscle relaxation, listening to music, being out in nature, journaling, and other hobbies. Again, the key is to find one or more that you enjoy and can do regularly.  Healthy lifestyle. Eat a balanced diet, get plenty of sleep, and do not smoke. Avoid using alcohol or drugs to relax.  Strong support network. Spend time with family, friends, or other people you enjoy being around.Express your feelings and talk things over with someone you trust.  Counseling or talktherapy with a mental health professional may be helpful if you are having difficulty managing stress on your own. Medicine is typically not recommended for the treatment of stress.Talk to your health care provider if you think you need medicine for symptoms of stress. Follow these instructions at home:  Keep all follow-up visits as directed by your health care provider.  Take all medicines as directed by your health care provider. Contact a health care provider if:  Your symptoms get worse or you start having new symptoms.  You feel overwhelmed by your problems and can no longer manage them on your own. Get help right away if:  You feel like hurting yourself or someone else. This information is not intended to replace advice given to you by your health care  provider. Make sure you discuss any questions you have with your health care provider. Document Released: 09/26/2000 Document Revised: 09/08/2015 Document Reviewed: 11/25/2012 Elsevier Interactive Patient Education  2017 [employer].    IF you received an x-ray today, you will receive an invoice from [facility]. Please contact [facility] at [phone] with questions or concerns regarding your invoice.   IF you received labwork today, you will receive an invoice from [facility]. Please contact [facility] at [phone] with questions or concerns regarding your invoice.   Our billing staff will not be able to assist you with questions regarding bills from these companies.  You will be contacted with the lab results as soon as they are available. The fastest way to get your results is to activate your My Chart account. Instructions are located on the last page of this paperwork. If you have not heard from [place] regarding the results in 2 weeks, please contact this office.      I personally performed the services described in this documentation, which was scribed in my presence. The recorded information has been reviewed and considered for accuracy and completeness, addended by me as needed, and agree with information above.  Signed,   Merri Ray, MD [facility].  05/30/17 2:46 PM

## 2017-06-03 ENCOUNTER — Other Ambulatory Visit: Payer: Self-pay | Admitting: Family Medicine

## 2017-06-03 DIAGNOSIS — I10 Essential (primary) hypertension: Secondary | ICD-10-CM

## 2017-06-12 DIAGNOSIS — K080 Exfoliation of teeth due to systemic causes: Secondary | ICD-10-CM | POA: Diagnosis not present

## 2017-06-15 ENCOUNTER — Other Ambulatory Visit: Payer: Self-pay | Admitting: Family Medicine

## 2017-06-15 DIAGNOSIS — F418 Other specified anxiety disorders: Secondary | ICD-10-CM

## 2017-06-17 NOTE — Telephone Encounter (Signed)
LOV 05/30/17 with Dr. Trinna PostJeffery Greene / Refill request for Zoloft / Refilled per protocol

## 2017-06-27 ENCOUNTER — Encounter: Payer: Self-pay | Admitting: Family Medicine

## 2017-06-27 ENCOUNTER — Ambulatory Visit: Payer: Federal, State, Local not specified - PPO | Admitting: Family Medicine

## 2017-06-27 ENCOUNTER — Other Ambulatory Visit: Payer: Self-pay

## 2017-06-27 VITALS — BP 118/70 | HR 51 | Temp 98.1°F | Resp 16 | Ht 71.26 in | Wt 241.2 lb

## 2017-06-27 DIAGNOSIS — F418 Other specified anxiety disorders: Secondary | ICD-10-CM | POA: Diagnosis not present

## 2017-06-27 DIAGNOSIS — I10 Essential (primary) hypertension: Secondary | ICD-10-CM | POA: Diagnosis not present

## 2017-06-27 DIAGNOSIS — E039 Hypothyroidism, unspecified: Secondary | ICD-10-CM

## 2017-06-27 MED ORDER — SERTRALINE HCL 50 MG PO TABS: 50.0000 mg | ORAL_TABLET | Freq: Every day | ORAL | 1 refills | Status: DC

## 2017-06-27 MED ORDER — LOSARTAN POTASSIUM 100 MG PO TABS: 100.0000 mg | ORAL_TABLET | Freq: Every day | ORAL | 1 refills | Status: DC

## 2017-06-27 NOTE — Progress Notes (Signed)
Subjective:  By signing my name below, I, Stann Oresung-Kai Tsai, attest that this documentation has been prepared under the direction and in the presence of Meredith StaggersJeffrey Samnang Shugars, MD. Electronically Signed: Stann Oresung-Kai Tsai, Scribe. 06/27/2017 , 2:29 PM .  Patient was seen in Room 11 .   Patient ID: Fred Davis, male    DOB: 02/03/1956, 62 y.o.   MRN: [id number] Chief Complaint  Patient presents with  . Hypertension    1 month follow-up   . Depression   HPI Fred Millarddward Dunphy is a 62 y.o. male Here for follow up of HTN and depression.   HTN Lab Results  Component Value Date   CREATININE 1.05 05/16/2017   BP Readings from Last 3 Encounters:  06/27/17 118/70  05/30/17 116/76  05/16/17 130/83   BP had improved at last visit on HCTZ and losartan. Slight elevation of plasma metanephrines, and with reported spikes in BP. He was referred to endocrinology. History of hemorrhagic stroke in 2018.   He hasn't heard call from endocrinology. He's been checking his BP outside of office and doing fairly well.   Hypothyroidism New diagnosis last visit and started on synthroid 50mcg QD. He wasn't able to start medication until March 3rd due to miscommunication with his pharmacy.   Anxiety/depression See previous notes. Patient was restarted on zoloft and short term klonopin. He was recommended counseling, but not yet seen last visit. Handout on stress management provided. Decided to stay at same dose of medication.   Patient states his mood has greatly improved. His brother had stopped talking to him, and in fact, patient states he's doing better. He notes having anxiety previously when coming in to be seen, but last night, he was able to sleep at 11:30PM and sleep through until 8:30AM. He initially took klonopin, but now he doesn't need it since his anxiety has improved.   Patient Active Problem List   Diagnosis Date Noted  . ICH (intracerebral hemorrhage) (HCC) - right acute ICH at right external capsule  11/14/2016  . Left ankle swelling 12/18/2014  . Left ankle pain 12/18/2014   Past Medical History:  Diagnosis Date  . Hypertension    Past Surgical History:  Procedure Laterality Date  . HERNIA REPAIR     Allergies  Allergen Reactions  . Lisinopril Cough   Prior to Admission medications   Medication Sig Start Date End Date Taking? Authorizing Provider  clonazePAM (KLONOPIN) 0.5 MG tablet Take 1 tablet (0.5 mg total) by mouth 2 (two) times daily as needed for anxiety. 05/16/17   Shade FloodGreene, Iridian Reader R, MD  diphenhydramine-acetaminophen (TYLENOL PM) 25-500 MG TABS Take 1 tablet by mouth at bedtime as needed.    [provider]  hydrochlorothiazide (HYDRODIURIL) 12.5 MG tablet Take 1 tablet (12.5 mg total) by mouth daily. 05/16/17   Shade FloodGreene, Amanii Snethen R, MD  levothyroxine (SYNTHROID, LEVOTHROID) 50 MCG tablet Take 1 tablet (50 mcg total) by mouth daily. 05/30/17   Shade FloodGreene, Joetta Delprado R, MD  losartan (COZAAR) 100 MG tablet TAKE 1 TABLET BY MOUTH EVERY DAY 06/03/17   Shade FloodGreene, Garey Alleva R, MD  sertraline (ZOLOFT) 50 MG tablet TAKE 1 TABLET BY MOUTH EVERY DAY 06/17/17   Shade FloodGreene, Hser Belanger R, MD  tetrahydrozoline 0.05 % ophthalmic solution Place 2 drops into both eyes as needed.    [provider]   Social History   Socioeconomic History  . Marital status: Divorced    Spouse name: Not on file  . Number of children: Not on file  . Years of  education: Not on file  . Highest education level: Not on file  Social Needs  . Financial resource strain: Not on file  . Food insecurity - worry: Not on file  . Food insecurity - inability: Not on file  . Transportation needs - medical: Not on file  . Transportation needs - non-medical: Not on file  Occupational History  . Not on file  Tobacco Use  . Smoking status: Former Smoker    Types: Cigarettes    Last attempt to quit: 11/14/2016    Years since quitting: 0.6  . Smokeless tobacco: Never Used  Substance and Sexual Activity  . Alcohol use: Yes      Alcohol/week: 1.8 oz    Types: 3 Cans of beer per week  . Drug use: No  . Sexual activity: Not on file  Other Topics Concern  . Not on file  Social History Narrative  . Not on file    Review of Systems  Constitutional: Negative for fatigue and unexpected weight change.  Eyes: Negative for visual disturbance.  Respiratory: Negative for cough, chest tightness and shortness of breath.   Cardiovascular: Negative for chest pain, palpitations and leg swelling.  Gastrointestinal: Negative for abdominal pain and blood in stool.  Neurological: Negative for dizziness, light-headedness and headaches.       Objective:   Physical Exam  Constitutional: He is oriented to person, place, and time. He appears well-developed and well-nourished.  HENT:  Head: Normocephalic and atraumatic.  Eyes: EOM are normal. Pupils are equal, round, and reactive to light.  Neck: No JVD present. Carotid bruit is not present. No thyromegaly present.  Cardiovascular: Normal rate, regular rhythm and normal heart sounds.  No murmur heard. Pulmonary/Chest: Effort normal and breath sounds normal. He has no rales.  Musculoskeletal: He exhibits no edema.  Neurological: He is alert and oriented to person, place, and time.  Skin: Skin is warm and dry.  Psychiatric: He has a normal mood and affect.  Vitals reviewed.   Vitals:   06/27/17 1405  BP: 118/70  Pulse: (!) 51  Resp: 16  Temp: 98.1 F (36.7 C)  TempSrc: Oral  SpO2: 96%  Weight: 241 lb 3.2 oz (109.4 kg)  Height: 5' 11.26" (1.81 m)       Assessment & Plan:   Mehran Guderian is a 62 y.o. male Essential hypertension - Plan: Basic metabolic panel, losartan (COZAAR) 100 MG tablet  - controlled, continue same regimen with losartan and HCTZ. Check electrolytes  - previous borderline plasma metanephrines. Can discuss further at follow-up with endocrinology.  Hypothyroidism, unspecified type  -recently started on meds. follow up with endocrinology as  planned.  Depression with anxiety - Plan: sertraline (ZOLOFT) 50 MG tablet  -improved, continue Zoloft same dose. Has Klonopin if needed. Recheck 3 months  Meds ordered this encounter  Medications  . sertraline (ZOLOFT) 50 MG tablet    Sig: Take 1 tablet (50 mg total) by mouth daily.    Dispense:  90 tablet    Refill:  1  . losartan (COZAAR) 100 MG tablet    Sig: Take 1 tablet (100 mg total) by mouth daily.    Dispense:  90 tablet    Refill:  1   Patient Instructions    I'm glad to hear that the mood has improved. Continue Zoloft once per day. Klonopin only if needed. Continue to work on Medical illustrator. Recheck depression/anxiety symptoms in next 3 months.   For thyroid, I did refer you  to endocrinologist last visit. They should be calling you shortly. Let me know if you have not heard from them in the next 2 weeks. They will likely recheck thyroid level at your appointment.   Blood pressure Scheduled today. No change in medicines for now. I will check your electrolytes. please follow-up in 3 months.  Thank you for coming in today.     IF you received an x-ray today, you will receive an invoice from [facility]. Please contact [facility] at [phone] with questions or concerns regarding your invoice.   IF you received labwork today, you will receive an invoice from [facility]. Please contact [facility] at [phone] with questions or concerns regarding your invoice.   Our billing staff will not be able to assist you with questions regarding bills from these companies.  You will be contacted with the lab results as soon as they are available. The fastest way to get your results is to activate your My Chart account. Instructions are located on the last page of this paperwork. If you have not heard from [place] regarding the results in 2 weeks, please contact this office.       I personally performed the services described in this documentation, which  was scribed in my presence. The recorded information has been reviewed and considered for accuracy and completeness, addended by me as needed, and agree with information above.  Signed,   Meredith Staggers, MD Primary Care at [facility].  06/27/17 2:33 PM

## 2017-06-27 NOTE — Patient Instructions (Addendum)
  I'm glad to hear that the mood has improved. Continue Zoloft once per day. Klonopin only if needed. Continue to work on Medical illustratorstress management techniques. Recheck depression/anxiety symptoms in next 3 months.   For thyroid, I did refer you to endocrinologist last visit. They should be calling you shortly. Let me know if you have not heard from them in the next 2 weeks. They will likely recheck thyroid level at your appointment.   Blood pressure Scheduled today. No change in medicines for now. I will check your electrolytes. please follow-up in 3 months.  Thank you for coming in today.     IF you received an x-ray today, you will receive an invoice from [facility]. Please contact [facility] at [phone] with questions or concerns regarding your invoice.   IF you received labwork today, you will receive an invoice from [facility]. Please contact [facility] at [phone] with questions or concerns regarding your invoice.   Our billing staff will not be able to assist you with questions regarding bills from these companies.  You will be contacted with the lab results as soon as they are available. The fastest way to get your results is to activate your My Chart account. Instructions are located on the last page of this paperwork. If you have not heard from us regarding the results in 2 weeks, please contact this office.

## 2017-06-28 LAB — BASIC METABOLIC PANEL
BUN/Creatinine Ratio: 17 (ref 10–24)
BUN: 18 mg/dL (ref 8–27)
CALCIUM: 9.4 mg/dL (ref 8.6–10.2)
CO2: 20 mmol/L (ref 20–29)
CREATININE: 1.07 mg/dL (ref 0.76–1.27)
Chloride: 104 mmol/L (ref 96–106)
GFR, EST AFRICAN AMERICAN: 86 mL/min/{1.73_m2} (ref >59)
GFR, EST NON AFRICAN AMERICAN: 75 mL/min/{1.73_m2} (ref >59)
Glucose: 83 mg/dL (ref 65–99)
Potassium: 4.3 mmol/L (ref 3.5–5.2)
Sodium: 139 mmol/L (ref 134–144)

## 2017-07-01 DIAGNOSIS — K080 Exfoliation of teeth due to systemic causes: Secondary | ICD-10-CM | POA: Diagnosis not present

## 2017-07-07 DIAGNOSIS — R1011 Right upper quadrant pain: Secondary | ICD-10-CM | POA: Diagnosis not present

## 2017-08-13 ENCOUNTER — Other Ambulatory Visit: Payer: Self-pay | Admitting: Family Medicine

## 2017-08-13 DIAGNOSIS — E039 Hypothyroidism, unspecified: Secondary | ICD-10-CM

## 2017-08-22 ENCOUNTER — Telehealth: Payer: Self-pay | Admitting: Family Medicine

## 2017-08-22 NOTE — Telephone Encounter (Signed)
Called and left VM for pt to call to the office and reschedule his appt with Dr. Neva Seat on 10/01/17. Dr. Neva Seat will be unavailable that day. When pt calls in, please reschedule him at his convenience with Dr. Neva Seat for 3 MONTH RECHECK ON HIS BLOOD PRESSURE, DEPRESSION AND ANXIETY. Thanks!

## 2017-10-01 ENCOUNTER — Ambulatory Visit: Payer: Self-pay | Admitting: Family Medicine

## 2017-10-08 ENCOUNTER — Other Ambulatory Visit: Payer: Self-pay | Admitting: Family Medicine

## 2017-10-08 DIAGNOSIS — E039 Hypothyroidism, unspecified: Secondary | ICD-10-CM

## 2017-10-10 ENCOUNTER — Other Ambulatory Visit: Payer: Self-pay | Admitting: Family Medicine

## 2017-10-10 DIAGNOSIS — E039 Hypothyroidism, unspecified: Secondary | ICD-10-CM

## 2017-10-31 ENCOUNTER — Other Ambulatory Visit: Payer: Self-pay | Admitting: Family Medicine

## 2017-10-31 DIAGNOSIS — E039 Hypothyroidism, unspecified: Secondary | ICD-10-CM

## 2017-11-08 ENCOUNTER — Other Ambulatory Visit: Payer: Self-pay | Admitting: Family Medicine

## 2017-11-08 DIAGNOSIS — I10 Essential (primary) hypertension: Secondary | ICD-10-CM

## 2017-11-13 DIAGNOSIS — K080 Exfoliation of teeth due to systemic causes: Secondary | ICD-10-CM | POA: Diagnosis not present

## 2017-11-25 ENCOUNTER — Other Ambulatory Visit: Payer: Self-pay | Admitting: Family Medicine

## 2017-11-25 DIAGNOSIS — E039 Hypothyroidism, unspecified: Secondary | ICD-10-CM

## 2017-11-27 DIAGNOSIS — K080 Exfoliation of teeth due to systemic causes: Secondary | ICD-10-CM | POA: Diagnosis not present

## 2017-12-05 ENCOUNTER — Other Ambulatory Visit: Payer: Self-pay | Admitting: Family Medicine

## 2017-12-05 DIAGNOSIS — I10 Essential (primary) hypertension: Secondary | ICD-10-CM

## 2017-12-05 NOTE — Telephone Encounter (Signed)
Refill request for hydrodiuril 12.5 mg #30 with 0 refills.  Pt needs office visit as pt was to return in 09/2017 for f/u. Dgaddy, CMA

## 2017-12-06 ENCOUNTER — Other Ambulatory Visit: Payer: Self-pay | Admitting: Family Medicine

## 2017-12-06 DIAGNOSIS — I10 Essential (primary) hypertension: Secondary | ICD-10-CM

## 2017-12-06 NOTE — Telephone Encounter (Signed)
Patient called, left VM to return call to the office to schedule a follow up with Dr. Neva SeatGreene.

## 2018-01-01 ENCOUNTER — Other Ambulatory Visit: Payer: Self-pay | Admitting: Family Medicine

## 2018-01-01 DIAGNOSIS — I10 Essential (primary) hypertension: Secondary | ICD-10-CM

## 2018-01-07 ENCOUNTER — Other Ambulatory Visit: Payer: Self-pay | Admitting: Family Medicine

## 2018-01-07 DIAGNOSIS — F418 Other specified anxiety disorders: Secondary | ICD-10-CM

## 2018-01-07 NOTE — Telephone Encounter (Signed)
Attempted to contact pt regarding refill; last office visit 06/27/17; no upcoming visits noted; left message for pt to call office on voicemail [phone]; when pt calls back please schedule appointment.

## 2018-01-09 ENCOUNTER — Telehealth: Payer: Self-pay | Admitting: Family Medicine

## 2018-01-09 NOTE — Telephone Encounter (Signed)
Copied from CRM [id number]. Topic: Inquiry >> Jan 09, 2018 12:29 PM Fred Davis B wrote: Reason for CRM: pt called to inquire about him having his thyroid checked; pt has not been to see another physician regarding that yet and pt wants to know is this something he should still pursue

## 2018-01-11 NOTE — Telephone Encounter (Signed)
When seen in March plan was to follow-up with endocrinology, Dr. Lucianne Muss.  Phone note from March 27 stated he would call back when he was ready to schedule. He can call their office, or I can see him in the interim if needed for meds and labwork.

## 2018-01-13 IMAGING — CT CT ANGIO HEAD
2 of 8 series · 8 of 33 positions shown · IV contrast (APPLIED)
Comparison: Head CT from yesterday

CLINICAL DATA: Intra cerebral hemorrhage.

EXAM:
CT ANGIOGRAPHY HEAD AND NECK
TECHNIQUE: Multidetector CT imaging of the head and neck was performed using
the standard protocol during bolus administration of intravenous
contrast. Multiplanar CT image reconstructions and MIPs were
obtained to evaluate the vascular anatomy. Carotid stenosis
measurements (when applicable) are obtained utilizing NASCET
criteria, using the distal internal carotid diameter as the
denominator.
CONTRAST:  50 cc Isovue 370 intravenous

[Series 5: cta neck/head · axial · 0.44mm/px · z∈[-227,-107]mm · 2 of 182 slices shown]
[im 61/182  soft-tissue]
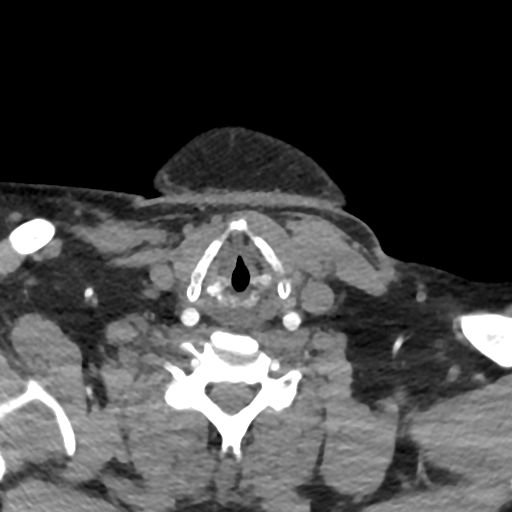
[im 121/182  soft-tissue]
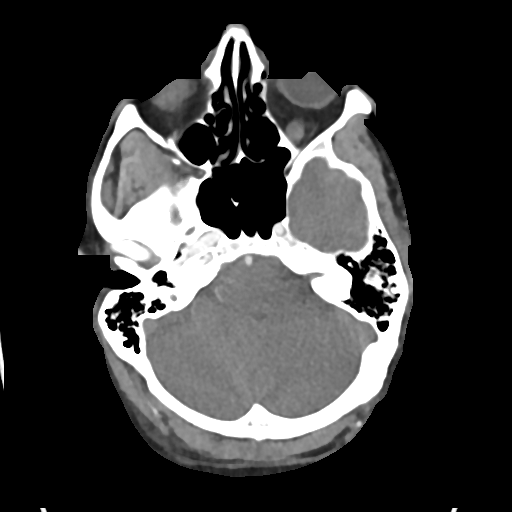

[Series 7: ax thins · axial · 0.35mm/px · z∈[-291,-32]mm · 6 of 363 slices shown]
[im 52/363  soft-tissue]
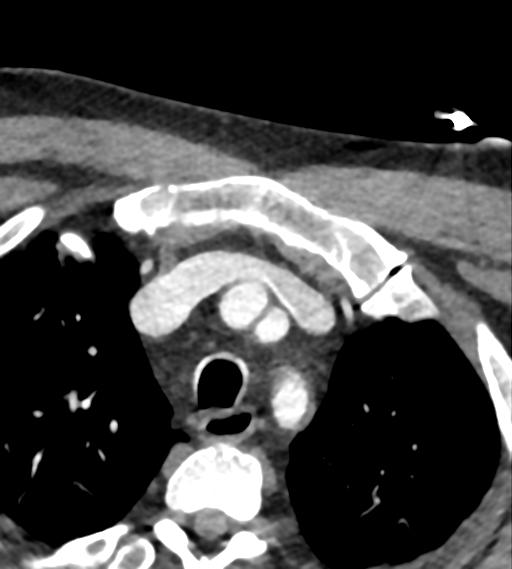
[im 104/363  bone]
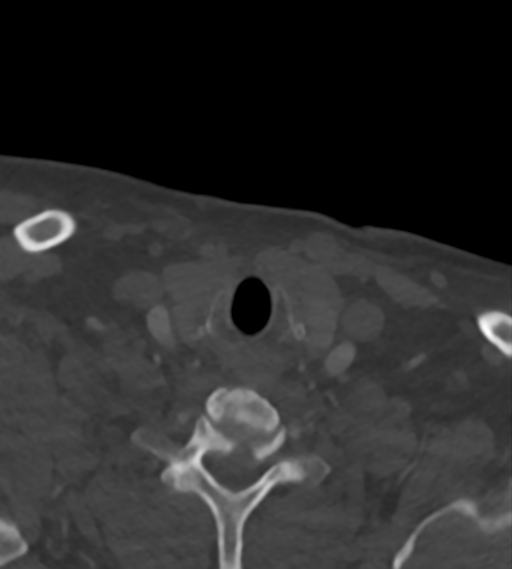
[im 156/363  soft-tissue]
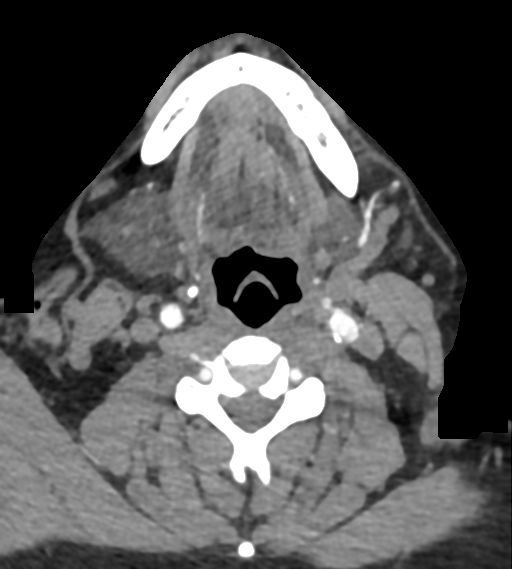
[im 207/363  bone]
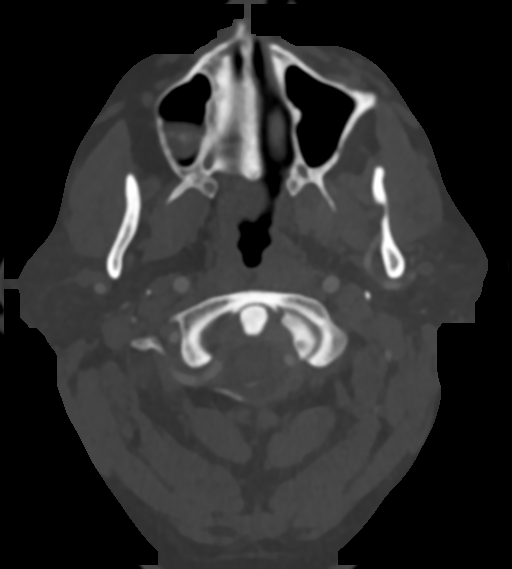
[im 259/363  soft-tissue]
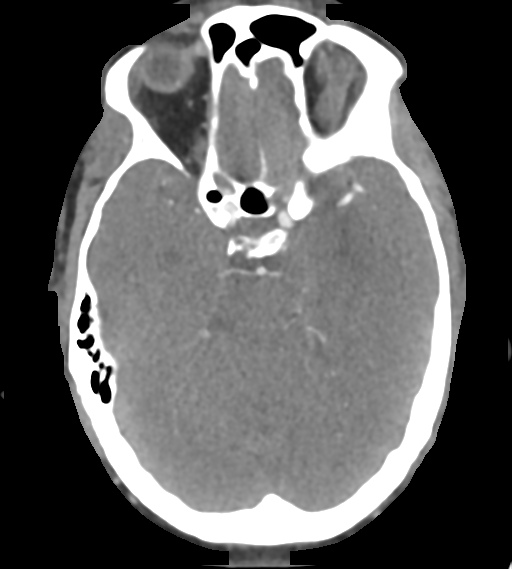
[im 311/363  bone]
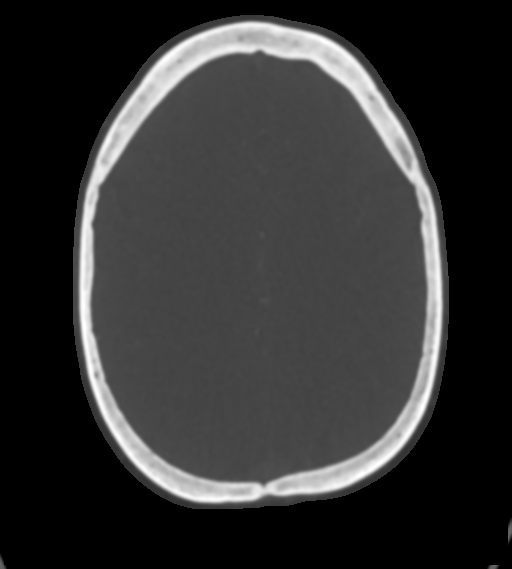

[8 of 33 positions shown; findings below may reference images not displayed]

FINDINGS: CTA NECK FINDINGS

Aortic arch: Calcification of the ligamentum arteriosum. There is
mild atheromatous wall thickening. No dissection or aneurysm. Three
vessel branching.

Right carotid system: Vessels are smooth and widely patent. No noted
atheromatous changes.

Left carotid system: Vessels are smooth and widely patent. Mild
mixed calcified and noncalcified plaque at the common carotid
bifurcation and ICA bulb.

Vertebral arteries: No proximal subclavian stenosis. Atherosclerotic
plaque at both vertebral artery ostia with mild to moderate stenosis
on the left and high-grade stenosis on the right. Stenoses are best
seen on coronal reformats. Limited evaluation of vertebral arteries
at the C2 level due to streak artifact. Otherwise, the vertebral
arteries are smooth and patent.

Skeleton: No acute or aggressive finding. Large periapical erosions
affecting bilateral mandibular molars.

Other neck: No noted mass or inflammation.

Upper chest: No acute finding

Review of the MIP images confirms the above findings

CTA HEAD FINDINGS

Anterior circulation: Calcified plaque on the carotid siphons
without stenosis. No abnormal vessels around the patient's
subcortical hemorrhage on the right. No spot sign. Negative for
aneurysm

Posterior circulation: Mild right vertebral artery dominance.
Cerebellar branches are patent. Mild irregularity of bilateral PCA
vessels, presumably atheromatous. No branch occlusion. Negative for
aneurysm. No evidence of vascular malformation. Intact
circle-of-Willis, with hypoplastic left P1 segment.

Venous sinuses: Patent on the delayed phase.

Anatomic variants: None other than described above.

Delayed phase: No abnormal enhancement around the hematoma. Hematoma
size is stable from yesterday.

Review of the MIP images confirms the above findings
IMPRESSION: 1. No visible vascular abnormality to explain the patient's
hemorrhage.
2. Bilateral vertebral origin atherosclerotic stenosis, high-grade
on the right and mild moderate on the left.
3. Cervical carotid and intracranial atherosclerosis without
significant stenosis.

## 2018-01-13 NOTE — Telephone Encounter (Signed)
I have spoken with pt and he stated that he was procrastinating and did not get that appointment set up with Dr. Lucianne Muss so he is going to call today to get that done. He has a f/u with Dr. Neva Seat on 01/29/2018 @ 2pm.  I have encouraged him to keep that appointment whether or not he sees Dr. Lucianne Muss sooner or later. Pt stated understanding.  Thanks

## 2018-01-13 NOTE — Telephone Encounter (Signed)
I have attempted to call pt and relay the providers message below. There was no answer so I left a message to call back.  Thanks

## 2018-01-15 IMAGING — CR DG CHEST 2V
2 series · 2 of 2 positions shown · non-contrast
Comparison: Prior chest x-ray 11/09/2010

CLINICAL DATA: 61-year-old male with dry cough and leukocytosis

EXAM:
CHEST  2 VIEW

[chest pa]
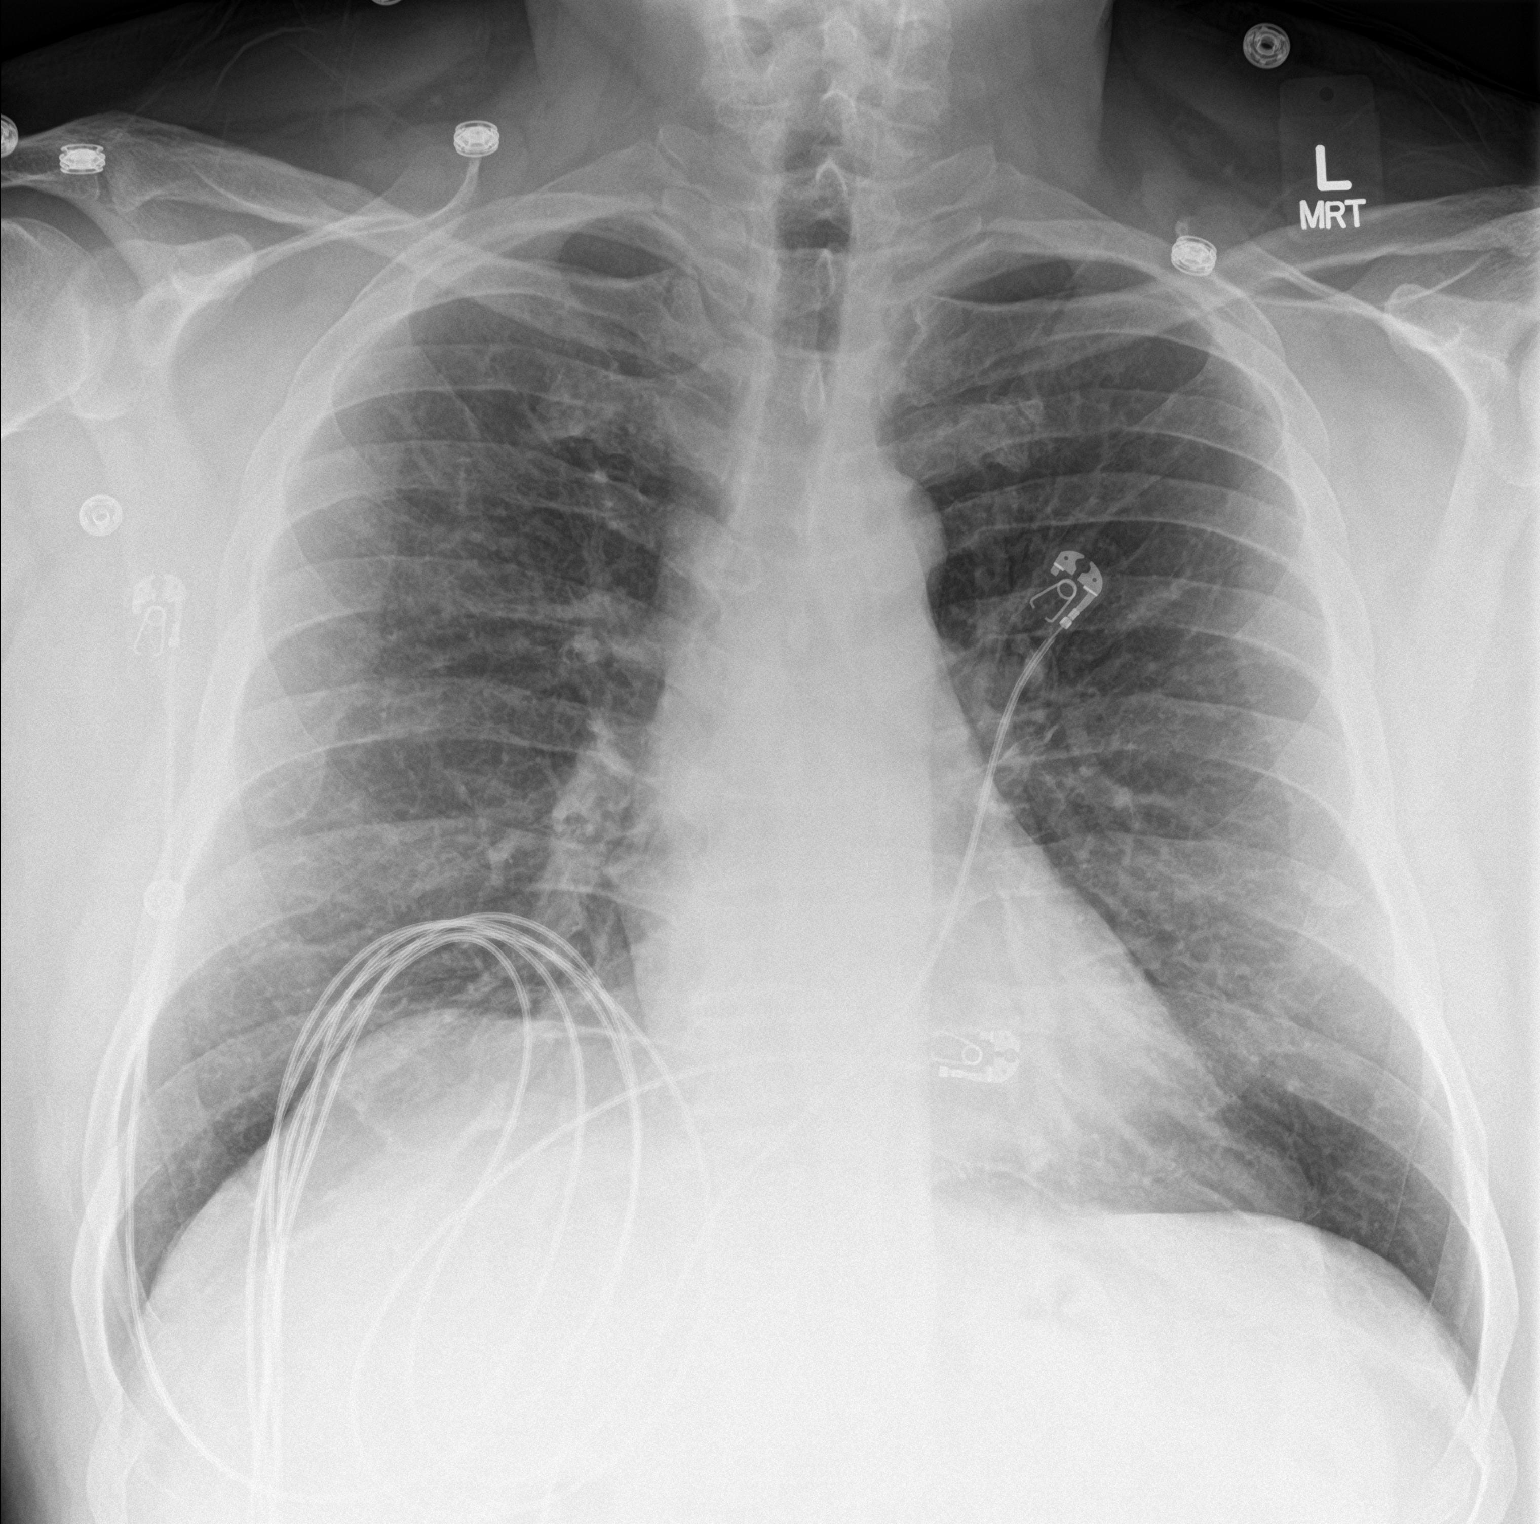

[chest lat]
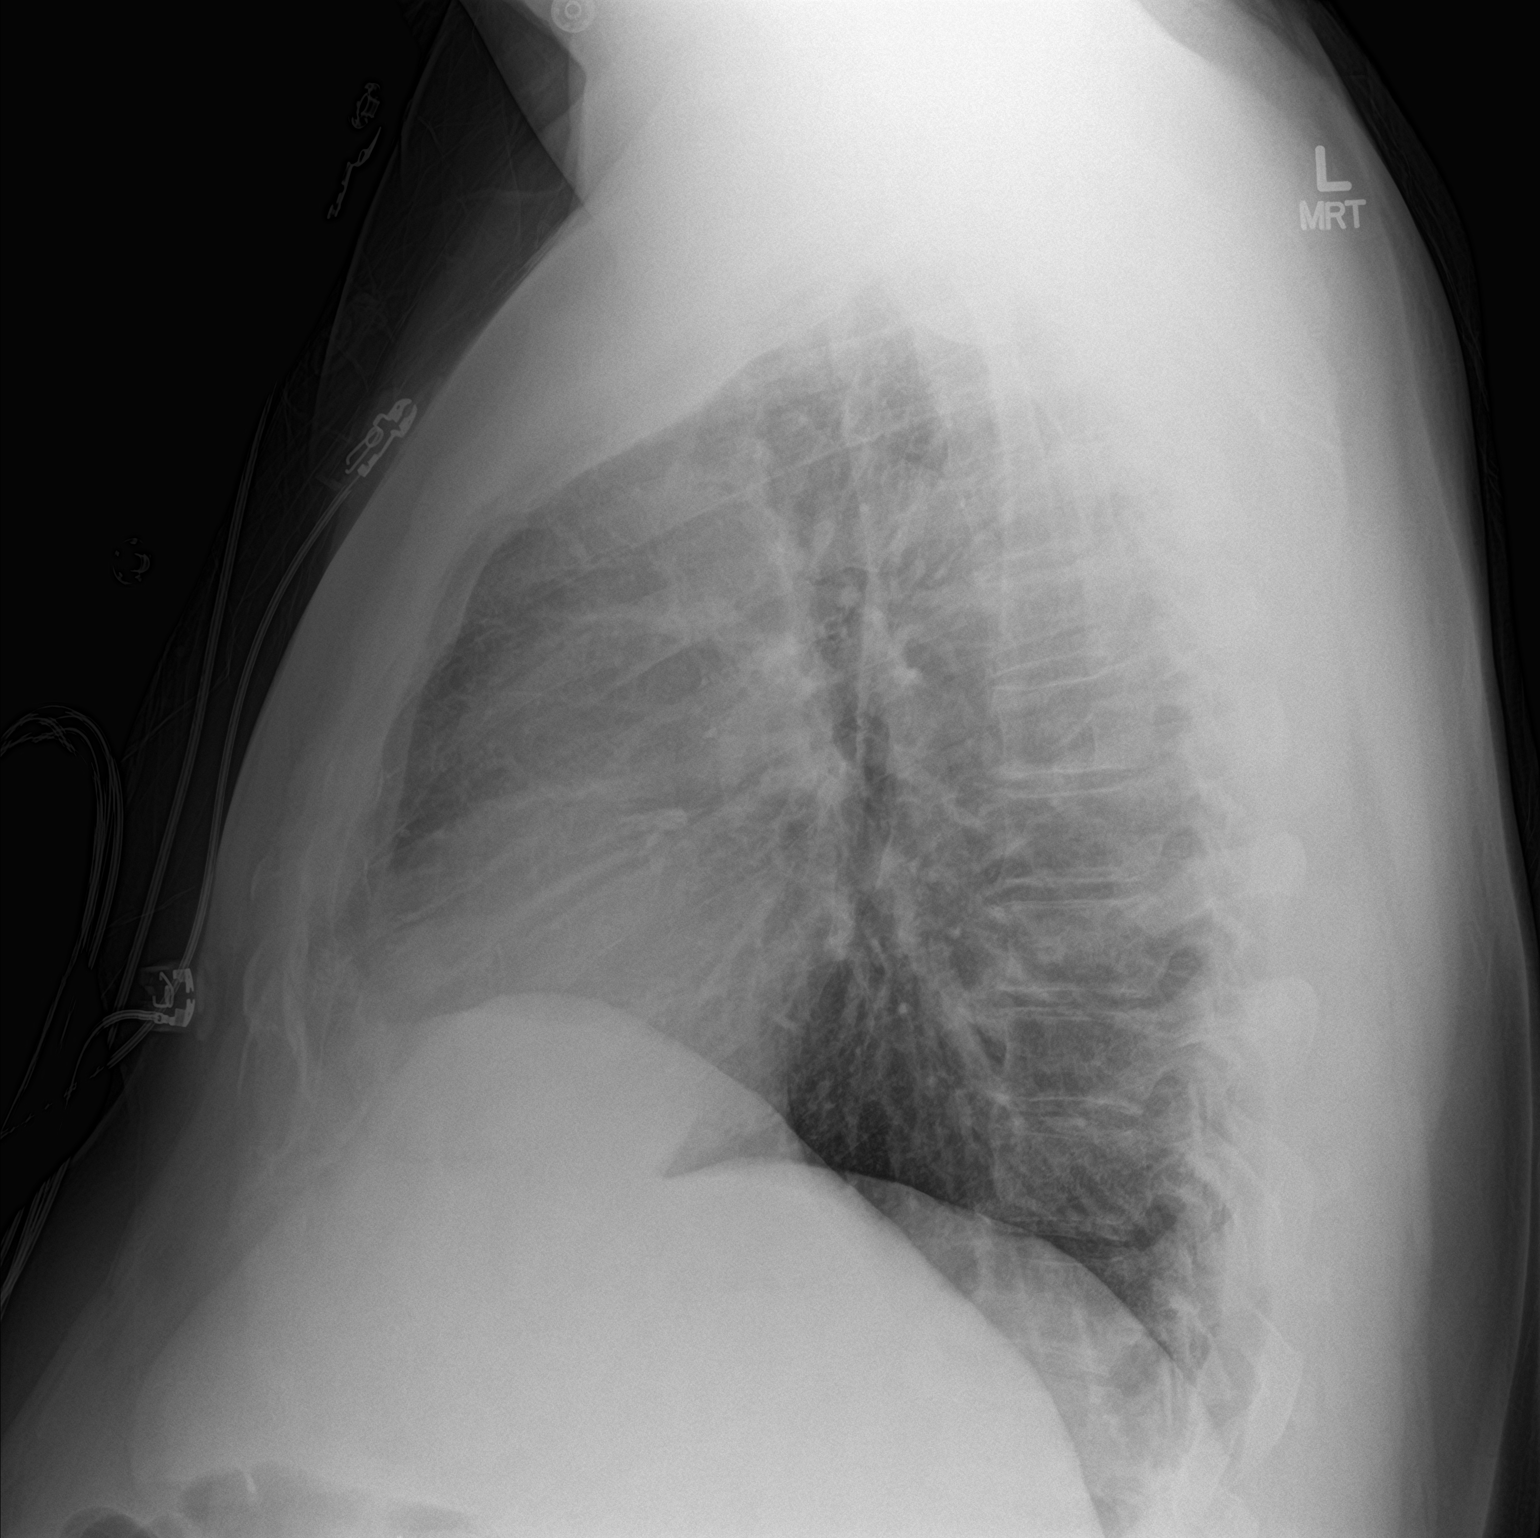

[2 of 2 positions shown; findings below may reference images not displayed]

FINDINGS: The lungs are clear and negative for focal airspace consolidation,
pulmonary edema or suspicious pulmonary nodule. No pleural effusion
or pneumothorax. Cardiac and mediastinal contours are within normal
limits. No acute fracture or lytic or blastic osseous lesions. The
visualized upper abdominal bowel gas pattern is unremarkable.
IMPRESSION: No active cardiopulmonary disease.

## 2018-01-25 ENCOUNTER — Other Ambulatory Visit: Payer: Self-pay | Admitting: Family Medicine

## 2018-01-25 DIAGNOSIS — I10 Essential (primary) hypertension: Secondary | ICD-10-CM

## 2018-01-27 NOTE — Telephone Encounter (Addendum)
Courtesy refill until appointment 02/05/18.  Requested Prescriptions  Signed Prescriptions Disp Refills   hydrochlorothiazide (HYDRODIURIL) 12.5 MG tablet 10 tablet 0    Sig: TAKE 1 TABLET BY MOUTH EVERY DAY     Cardiovascular: Diuretics - Thiazide Failed - 01/25/2018 10:35 AM      Failed - Valid encounter within last 6 months    Recent Outpatient Visits          7 months ago Essential hypertension   [facility], Asencion Partridge, MD   8 months ago Essential hypertension   [facility], Asencion Partridge, MD   8 months ago Depression with anxiety   [facility], Asencion Partridge, MD   1 year ago Adjustment disorder, unspecified type   [facility], Asencion Partridge, MD   1 year ago History of CVA (cerebrovascular accident)   [facility], Asencion Partridge, MD      Future Appointments            In 1 week Shade Flood, MD [facility] at [place], [facility]           Passed - Ca in normal range and within 360 days    Calcium  Date Value Ref Range Status  06/27/2017 9.4 8.6 - 10.2 mg/dL Final         Passed - Cr in normal range and within 360 days    Creat  Date Value Ref Range Status  07/13/2014 0.86 0.50 - 1.35 mg/dL Final   Creatinine, Ser  Date Value Ref Range Status  06/27/2017 1.07 0.76 - 1.27 mg/dL Final         Passed - K in normal range and within 360 days    Potassium  Date Value Ref Range Status  06/27/2017 4.3 3.5 - 5.2 mmol/L Final         Passed - Na in normal range and within 360 days    Sodium  Date Value Ref Range Status  06/27/2017 139 134 - 144 mmol/L Final         Passed - Last BP in normal range    BP Readings from Last 1 Encounters:  06/27/17 118/70

## 2018-01-29 ENCOUNTER — Other Ambulatory Visit: Payer: Self-pay | Admitting: Family Medicine

## 2018-01-29 ENCOUNTER — Ambulatory Visit: Payer: Federal, State, Local not specified - PPO | Admitting: Internal Medicine

## 2018-01-29 ENCOUNTER — Ambulatory Visit: Payer: Self-pay | Admitting: Family Medicine

## 2018-01-29 ENCOUNTER — Encounter: Payer: Self-pay | Admitting: Internal Medicine

## 2018-01-29 VITALS — BP 132/84 | HR 49 | Resp 16 | Ht 71.0 in | Wt 266.0 lb

## 2018-01-29 DIAGNOSIS — E039 Hypothyroidism, unspecified: Secondary | ICD-10-CM | POA: Diagnosis not present

## 2018-01-29 DIAGNOSIS — I10 Essential (primary) hypertension: Secondary | ICD-10-CM

## 2018-01-29 LAB — TSH: TSH: 26.33 u[IU]/mL — ABNORMAL HIGH (ref 0.35–4.50)

## 2018-01-29 MED ORDER — LEVOTHYROXINE SODIUM 100 MCG PO TABS: 100.0000 ug | ORAL_TABLET | Freq: Every day | ORAL | 1 refills | Status: DC

## 2018-01-29 NOTE — Patient Instructions (Signed)

## 2018-01-30 LAB — THYROID PEROXIDASE ANTIBODY: THYROID PEROXIDASE ANTIBODY: 710 [IU]/mL — AB (ref <9)

## 2018-01-30 NOTE — Progress Notes (Signed)
Name: Fred Davis  MRN/ DOB: [id number], 06/08/1955    Age/ Sex: 62 y.o., male    PCP: Shade Flood, MD   Reason for Endocrinology Evaluation: Hypothyroidism      Date of Initial Endocrinology Evaluation: 01/30/2018     HPI: Fred Davis is a 62 y.o. male with a past medical history of HTN and CVA (No motor deficit just numbness/2018). The patient presented for initial endocrinology clinic visit on 01/30/2018 for consultative assistance with his Hypothyroidism .   Fred Davis suffered a stroke in the summer of 2018. This was attributed to uncontrolled HTN and tobacco use. He has quit tobacco since then. During a routine follow up with his PCP he was noted to have a TSH of 15.134 uIU/mL on 11/15/2016 due to miscommunication his LT-4 replacement was started later then prescribed with a repeat TSH of 30.0 uIU/mL in January , 2019.   Patient denies any local neck symptoms such as swelling, pain, voice hoarseness or sob. He denies previous exposure to radiation.  He does admit to weight gain especially in the past 6 months. He also developed depression after his stroke but currently well controlled on antidepressants. He denies any hair loss, dry skin or constipation.   He believes both his parents are on LT-4 replacements.    He was started on LT-44 replacement by his PCP at 50 mcg daily but he ran out ~ 6 weeks ago.   HISTORY:  Past Medical History:  Past Medical History:  Diagnosis Date  . Hypertension     Past Surgical History:  Past Surgical History:  Procedure Laterality Date  . HERNIA REPAIR        Social History:  reports that he quit smoking about 14 months ago. His smoking use included cigarettes. He has never used smokeless tobacco. He reports that he drinks about 3.0 standard drinks of alcohol per week. He reports that he does not use drugs.  Family History: family history includes Multiple sclerosis in his mother; Stroke in his father.   HOME  MEDICATIONS: Current Outpatient Medications on File Prior to Visit  Medication Sig Dispense Refill  . clonazePAM (KLONOPIN) 0.5 MG tablet Take 1 tablet (0.5 mg total) by mouth 2 (two) times daily as needed for anxiety. 20 tablet 1  . diphenhydramine-acetaminophen (TYLENOL PM) 25-500 MG TABS Take 1 tablet by mouth at bedtime as needed.    . hydrochlorothiazide (HYDRODIURIL) 12.5 MG tablet TAKE 1 TABLET BY MOUTH EVERY DAY 10 tablet 0  . losartan (COZAAR) 100 MG tablet Take 1 tablet (100 mg total) by mouth daily. 90 tablet 1  . sertraline (ZOLOFT) 50 MG tablet TAKE 1 TABLET BY MOUTH EVERY DAY 90 tablet 0  . temazepam (RESTORIL) 30 MG capsule Take by mouth at bedtime as needed.     Marland Kitchen tetrahydrozoline 0.05 % ophthalmic solution Place 2 drops into both eyes as needed.     No current facility-administered medications on file prior to visit.       REVIEW OF SYSTEMS: A comprehensive ROS was conducted with the patient and is negative except as per HPI and below:  ROS     OBJECTIVE:  VS: BP 132/84   Pulse (!) 49   Resp 16   Ht 5\' 11"  (1.803 m)   Wt 120.7 kg   SpO2 97%   BMI 37.10 kg/m    EXAM: General: Pt appears well and is in NAD  Hydration: Well-hydrated with moist mucous membranes and  good skin turgor  Eyes: External eye exam normal without stare, lid lag or exophthalmos.  EOM intact.  PERRL.  Ears, Nose, Throat: Hearing: Grossly intact bilaterally Dental: Good dentition  Throat: Clear without mass, erythema or exudate  Neck: General: Supple without adenopathy. Thyroid: Thyroid size normal.  No goiter or nodules appreciated. No thyroid bruit.  Lungs: Clear with good BS bilat with no rales, rhonchi, or wheezes  Heart: Auscultation: RRR.  Abdomen: Normoactive bowel sounds, soft, nontender, without masses or organomegaly palpable  Extremities: BL LE: No pretibial edema normal ROM and strength.  Skin: Hair: Texture and amount normal with gender appropriate distribution Skin  Inspection: No rashes, acanthosis nigricans/skin tags.  Skin Palpation: Skin temperature, texture, and thickness normal to palpation  Neuro: Cranial nerves: II - XII grossly intact  Motor: Normal strength throughout DTRs: 2+ and symmetric in UE without delay in relaxation phase  Mental Status: Judgment, insight: Intact Orientation: Oriented to time, place, and person Mood and affect: No depression, anxiety, or agitation     DATA REVIEWED: Results for Fred Davis (MRN [id number]) as of 01/30/2018 12:53  Ref. Range 11/15/2016 06:53 11/15/2016 15:13 05/16/2017 16:42 01/29/2018 12:00  TSH Latest Ref Range: 0.35 - 4.50 uIU/mL 15.134 (H)  30.000 (H) 26.33 (H)  T4,Free(Direct) Latest Ref Range: 0.82 - 1.77 ng/dL  4.09 (L) 8.11 (L)   Thyroperoxidase Ab SerPl-aCnc Latest Ref Range: <9 IU/mL    710 (H)      ASSESSMENT/PLAN/RECOMMENDATIONS:   1. Hypothyroidism secondary to Hashimoto's Thyroiditis :  - Patient is bio-chemically hypothyroid - Clinically other then the weight gain , he is euthyroid - Discussed the importance of continuing on LT-4 replacement for life.  - Pt educated extensively on the correct way to take levothyroxine (first thing in the morning with water, 30 minutes before eating or taking other medications). - Pt encouraged to double dose the following day if she were to miss a dose given long half-life of levothyroxine.  Recommendations:  Levothyroxine 100 mcg daily    F/U in 2 months.   Addendum: Discussed lab results of elevated TSH and Anti-TPO Ab's with patient on 01/30/18 @ 1615. I advised him LT-4 replacement will have to be for life.  Signed electronically by:  Lyndle Herrlich, MD  [facility]  [facility] [address] [place] [place], [place] [address] Phone: [phone] FAX: [phone]   CC: Shade Flood, MD [address] [place] [address] Phone: [phone] Fax: [phone]   Return to  Endocrinology clinic as below: Future Appointments  Date Time Provider Department Center  02/05/2018  2:20 PM Shade Flood, MD [facility]  04/02/2018  2:00 PM Charlotta Lapaglia, Landry Mellow, MD [facility] None

## 2018-02-05 ENCOUNTER — Other Ambulatory Visit: Payer: Self-pay

## 2018-02-05 ENCOUNTER — Ambulatory Visit: Payer: Federal, State, Local not specified - PPO | Admitting: Family Medicine

## 2018-02-05 ENCOUNTER — Encounter: Payer: Self-pay | Admitting: Family Medicine

## 2018-02-05 VITALS — BP 129/79 | HR 50 | Temp 97.8°F | Resp 16 | Ht 71.0 in | Wt 266.8 lb

## 2018-02-05 DIAGNOSIS — Z13220 Encounter for screening for lipoid disorders: Secondary | ICD-10-CM | POA: Diagnosis not present

## 2018-02-05 DIAGNOSIS — Z8673 Personal history of transient ischemic attack (TIA), and cerebral infarction without residual deficits: Secondary | ICD-10-CM

## 2018-02-05 DIAGNOSIS — I10 Essential (primary) hypertension: Secondary | ICD-10-CM | POA: Diagnosis not present

## 2018-02-05 DIAGNOSIS — Z1159 Encounter for screening for other viral diseases: Secondary | ICD-10-CM

## 2018-02-05 DIAGNOSIS — F418 Other specified anxiety disorders: Secondary | ICD-10-CM

## 2018-02-05 DIAGNOSIS — Z23 Encounter for immunization: Secondary | ICD-10-CM | POA: Diagnosis not present

## 2018-02-05 DIAGNOSIS — Z1211 Encounter for screening for malignant neoplasm of colon: Secondary | ICD-10-CM

## 2018-02-05 MED ORDER — LOSARTAN POTASSIUM 100 MG PO TABS: 100.0000 mg | ORAL_TABLET | Freq: Every day | ORAL | 1 refills | Status: DC

## 2018-02-05 MED ORDER — HYDROCHLOROTHIAZIDE 12.5 MG PO TABS: 12.5000 mg | ORAL_TABLET | Freq: Every day | ORAL | 1 refills | Status: DC

## 2018-02-05 MED ORDER — SERTRALINE HCL 50 MG PO TABS: 50.0000 mg | ORAL_TABLET | Freq: Every day | ORAL | 1 refills | Status: DC

## 2018-02-05 NOTE — Progress Notes (Signed)
Subjective:  By signing my name below, I, Stann Ore, attest that this documentation has been prepared under the direction and in the presence of Meredith Staggers, MD. Electronically Signed: Stann Ore, Scribe. 02/05/2018 , 3:10 PM .  Patient was seen in Room 12 .   Patient ID: Fred Davis, male    DOB: [birth year], 62 y.o.   MRN: [id number] Chief Complaint  Patient presents with  . Follow-up    refill on medication- seen by the oncology 1 wk and thyroid level was way off was started on synthroid   HPI Fred Davis is a 62 y.o. male Last seen in March. His last meal was around 9:30 this morning.   HTN BP Readings from Last 3 Encounters:  02/05/18 129/79  01/29/18 132/84  06/27/17 118/70   Lab Results  Component Value Date   CREATININE 1.07 06/27/2017   He had slight elevation in plasma metanephrines. He had spikes in BP in the past, referred to endocrinology previously. He's taking Losartan 100 mg qd and HCTZ 12.5 mg qd. His home readings have been usually around 120/70, and once 134/77. He does check it once daily.   Hypothyroidism Most recently seen by Dr. Lonzo Cloud on Oct 16th. Most recent TSH 26.33 on 10/16. He is bio-chemically hypothyroid; clinically he is euthyroid. He was continued on synthroid 100 mcg daily; previously 50 mcg, but had been off for 6 weeks.   History of hemorrhagic stroke His neurologist is Dr. Lucia Gaskins. Thought to be due to uncontrolled HTN, goal of 130/90. Aspirin 81 mg daily for prevention of ischemic stroke, follow up as needed.   Lab Results  Component Value Date   CHOL 154 11/15/2016   HDL 44 11/15/2016   LDLCALC 98 11/15/2016   TRIG 61 11/15/2016   CHOLHDL 3.5 11/15/2016   Ideal goal from ischemic prevention, LDL <70; not currently on a statin.   Depression and anxiety Last discussed in March. He had been restarted on Zoloft with short term klonopin; mood improved at that time.   Patient reports he didn't use any klonopin. He's  used temazepam once-twice since last visit. He drinks about 3 beers in football game's time span (roughly 3.5 hours). At home, he practices his guitar with classic rock. He also follows fantasy football. He also helps out his dad.   Health maintenance Hep C screening: agrees to screening on blood work today.  Colonoscopy: declines colonoscopy at this time. He would like to try ColoGuard.  Tdap: will update today.   Patient Active Problem List   Diagnosis Date Noted  . ICH (intracerebral hemorrhage) (HCC) - right acute ICH at right external capsule 11/14/2016  . Left ankle swelling 12/18/2014  . Left ankle pain 12/18/2014   Past Medical History:  Diagnosis Date  . Hypertension   . Thyroid disease    Past Surgical History:  Procedure Laterality Date  . HERNIA REPAIR     Allergies  Allergen Reactions  . Lisinopril Cough   Prior to Admission medications   Medication Sig Start Date End Date Taking? Authorizing Provider  clonazePAM (KLONOPIN) 0.5 MG tablet Take 1 tablet (0.5 mg total) by mouth 2 (two) times daily as needed for anxiety. 05/16/17   Shade Flood, MD  diphenhydramine-acetaminophen (TYLENOL PM) 25-500 MG TABS Take 1 tablet by mouth at bedtime as needed.    [provider]  hydrochlorothiazide (HYDRODIURIL) 12.5 MG tablet TAKE 1 TABLET BY MOUTH EVERY DAY 01/27/18   Shade Flood, MD  levothyroxine (SYNTHROID)  100 MCG tablet Take 1 tablet (100 mcg total) by mouth daily. 01/29/18 01/29/19  Shamleffer, Konrad Dolores, MD  losartan (COZAAR) 100 MG tablet Take 1 tablet (100 mg total) by mouth daily. 06/27/17   Shade Flood, MD  sertraline (ZOLOFT) 50 MG tablet TAKE 1 TABLET BY MOUTH EVERY DAY 01/07/18   Shade Flood, MD  temazepam (RESTORIL) 30 MG capsule Take by mouth at bedtime as needed.  12/11/16   [provider]  tetrahydrozoline 0.05 % ophthalmic solution Place 2 drops into both eyes as needed.    [provider]   Social History     Socioeconomic History  . Marital status: Divorced    Spouse name: Not on file  . Number of children: Not on file  . Years of education: Not on file  . Highest education level: Not on file  Occupational History  . Not on file  Social Needs  . Financial resource strain: Not on file  . Food insecurity:    Worry: Not on file    Inability: Not on file  . Transportation needs:    Medical: Not on file    Non-medical: Not on file  Tobacco Use  . Smoking status: Former Smoker    Types: Cigarettes    Last attempt to quit: 11/14/2016    Years since quitting: 1.2  . Smokeless tobacco: Never Used  Substance and Sexual Activity  . Alcohol use: Yes    Alcohol/week: 3.0 standard drinks    Types: 3 Cans of beer per week  . Drug use: No  . Sexual activity: Not on file  Lifestyle  . Physical activity:    Days per week: Not on file    Minutes per session: Not on file  . Stress: Not on file  Relationships  . Social connections:    Talks on phone: Not on file    Gets together: Not on file    Attends religious service: Not on file    Active member of club or organization: Not on file    Attends meetings of clubs or organizations: Not on file    Relationship status: Not on file  . Intimate partner violence:    Fear of current or ex partner: Not on file    Emotionally abused: Not on file    Physically abused: Not on file    Forced sexual activity: Not on file  Other Topics Concern  . Not on file  Social History Narrative  . Not on file   Review of Systems  Constitutional: Negative for fatigue and unexpected weight change.  Eyes: Negative for visual disturbance.  Respiratory: Negative for cough, chest tightness and shortness of breath.   Cardiovascular: Negative for chest pain, palpitations and leg swelling.  Gastrointestinal: Negative for abdominal pain and blood in stool.  Neurological: Negative for dizziness, light-headedness and headaches.       Objective:   Physical Exam   Constitutional: He is oriented to person, place, and time. He appears well-developed and well-nourished.  HENT:  Head: Normocephalic and atraumatic.  Eyes: Pupils are equal, round, and reactive to light. EOM are normal.  Neck: No JVD present. Carotid bruit is not present.  Cardiovascular: Normal rate, regular rhythm and normal heart sounds.  No murmur heard. Pulmonary/Chest: Effort normal and breath sounds normal. He has no rales.  Musculoskeletal: He exhibits no edema.  Neurological: He is alert and oriented to person, place, and time.  Skin: Skin is warm and dry.  Psychiatric:  He has a normal mood and affect.  Vitals reviewed.   Vitals:   02/05/18 1422  BP: 129/79  Pulse: (!) 50  Resp: 16  Temp: 97.8 F (36.6 C)  TempSrc: Oral  SpO2: 96%  Weight: 266 lb 12.8 oz (121 kg)  Height: 5\' 11"  (1.803 m)       Assessment & Plan:   Fred Davis is a 62 y.o. male Essential hypertension - Plan: Comprehensive metabolic panel, losartan (COZAAR) 100 MG tablet, hydrochlorothiazide (HYDRODIURIL) 12.5 MG tablet  -  Stable, tolerating current regimen. Medications refilled. Labs pending as above.   Depression with anxiety - Plan: sertraline (ZOLOFT) 50 MG tablet  -  Stable, tolerating current regimen. Medications refilled.   History of CVA in adulthood  -Asymptomatic, continue blood pressure management, check lipids for optimum control.  Screening for hyperlipidemia - Plan: Lipid panel  Encounter for hepatitis C screening test for low risk patient - Plan: Hepatitis C antibody  Screen for colon cancer - Plan: Cologuard  -Options discussed including Cologuard versus colonoscopy and potential need for diagnostic colonoscopy if positive Cologuard.   Need for Tdap vaccination - Plan: Tdap vaccine greater than or equal to 7yo IM   Meds ordered this encounter  Medications  . sertraline (ZOLOFT) 50 MG tablet    Sig: Take 1 tablet (50 mg total) by mouth daily.    Dispense:  90 tablet     Refill:  1  . losartan (COZAAR) 100 MG tablet    Sig: Take 1 tablet (100 mg total) by mouth daily.    Dispense:  90 tablet    Refill:  1  . hydrochlorothiazide (HYDRODIURIL) 12.5 MG tablet    Sig: Take 1 tablet (12.5 mg total) by mouth daily.    Dispense:  90 tablet    Refill:  1   Patient Instructions    Blood pressure ok on current regimen. Goal less than 130/80.   I will check cholesterol on testing.  If LDL over 70, would consider a statim medication to lessen risk of stroke.   No change in other meds for now.   Tetanus vaccine was given today.   I ordered the Cologuard for colon cancer screening.   Follow up in 6 months for physical.   Thanks for coming in today.   If you have lab work done today you will be contacted with your lab results within the next 2 weeks.  If you have not heard from [place] then please contact us. The fastest way to get your results is to register for My Chart.   IF you received an x-ray today, you will receive an invoice from [facility]. Please contact [facility] at [phone] with questions or concerns regarding your invoice.   IF you received labwork today, you will receive an invoice from [place]. Please contact [facility] at [phone] with questions or concerns regarding your invoice.   Our billing staff will not be able to assist you with questions regarding bills from these companies.  You will be contacted with the lab results as soon as they are available. The fastest way to get your results is to activate your My Chart account. Instructions are located on the last page of this paperwork. If you have not heard from [place] regarding the results in 2 weeks, please contact this office.       I personally performed the services described in this documentation, which was scribed in my presence. The recorded information has been reviewed and  considered for accuracy and completeness, addended by me as needed, and agree with  information above.  Signed,   Meredith Staggers, MD Primary Care at [facility].  02/08/18 9:40 AM

## 2018-02-05 NOTE — Patient Instructions (Addendum)
  Blood pressure ok on current regimen. Goal less than 130/80.   I will check cholesterol on testing.  If LDL over 70, would consider a statim medication to lessen risk of stroke.   No change in other meds for now.   Tetanus vaccine was given today.   I ordered the Cologuard for colon cancer screening.   Follow up in 6 months for physical.   Thanks for coming in today.   If you have lab work done today you will be contacted with your lab results within the next 2 weeks.  If you have not heard from [place] then please contact us. The fastest way to get your results is to register for My Chart.   IF you received an x-ray today, you will receive an invoice from [facility]. Please contact [facility] at [phone] with questions or concerns regarding your invoice.   IF you received labwork today, you will receive an invoice from [place]. Please contact [facility] at [phone] with questions or concerns regarding your invoice.   Our billing staff will not be able to assist you with questions regarding bills from these companies.  You will be contacted with the lab results as soon as they are available. The fastest way to get your results is to activate your My Chart account. Instructions are located on the last page of this paperwork. If you have not heard from [place] regarding the results in 2 weeks, please contact this office.

## 2018-02-06 LAB — LIPID PANEL
Chol/HDL Ratio: 3.3 ratio (ref 0.0–5.0)
Cholesterol, Total: 179 mg/dL (ref 100–199)
HDL: 54 mg/dL (ref >39)
LDL Calculated: 107 mg/dL — ABNORMAL HIGH (ref 0–99)
Triglycerides: 90 mg/dL (ref 0–149)
VLDL Cholesterol Cal: 18 mg/dL (ref 5–40)

## 2018-02-06 LAB — COMPREHENSIVE METABOLIC PANEL
ALBUMIN: 4.5 g/dL (ref 3.6–4.8)
ALT: 16 IU/L (ref 0–44)
AST: 18 IU/L (ref 0–40)
Albumin/Globulin Ratio: 1.5 (ref 1.2–2.2)
Alkaline Phosphatase: 78 IU/L (ref 39–117)
BILIRUBIN TOTAL: 0.3 mg/dL (ref 0.0–1.2)
BUN/Creatinine Ratio: 17 (ref 10–24)
BUN: 15 mg/dL (ref 8–27)
CALCIUM: 9.4 mg/dL (ref 8.6–10.2)
CO2: 21 mmol/L (ref 20–29)
CREATININE: 0.86 mg/dL (ref 0.76–1.27)
Chloride: 102 mmol/L (ref 96–106)
GFR calc Af Amer: 108 mL/min/{1.73_m2} (ref >59)
GFR, EST NON AFRICAN AMERICAN: 94 mL/min/{1.73_m2} (ref >59)
GLUCOSE: 89 mg/dL (ref 65–99)
Globulin, Total: 3.1 g/dL (ref 1.5–4.5)
Potassium: 4.7 mmol/L (ref 3.5–5.2)
SODIUM: 138 mmol/L (ref 134–144)
TOTAL PROTEIN: 7.6 g/dL (ref 6.0–8.5)

## 2018-02-06 LAB — HEPATITIS C ANTIBODY: Hep C Virus Ab: <0.1 s/co ratio (ref 0.0–0.9)

## 2018-02-18 ENCOUNTER — Encounter: Payer: Self-pay | Admitting: *Deleted

## 2018-03-18 LAB — COLOGUARD: COLOGUARD: NEGATIVE

## 2018-04-01 NOTE — Progress Notes (Signed)
Name: Fred Davis  MRN/ DOB: [id number], 05/31/1955    Age/ Sex: 62 y.o., male     PCP: Shade Flood, MD   Reason for Endocrinology Evaluation: Hypothyroidism     Initial Endocrinology Clinic Visit: 01/30/18    PATIENT IDENTIFIER: Fred Davis is a 62 y.o., male with a past medical history of HTN and CVA (No motor deficit just numbness/2018) . He has followed with [facility] since 01/30/18 for consultative assistance with management of his hypothyroidism.   HISTORICAL SUMMARY: The patient was first diagnosed with hypothyroidism on 11/15/16. LT-4 replacement was prescribed at the time but the patient did not start taking it until January, 2019 with a repeat TSH of 30.0 uIU/mL at the time.   On his initial visit to [place], he was scheduled to be on Levothyroxine 50 mcg daily but had ran out for ~ 69 weeks with a TSH of 26.33 uIU/mL and was started on Levothyroxine 100 mcg daily   His Anti-TPO Ab's were elevated at 710 IU/mL   SUBJECTIVE:    Today (04/02/2018):  Fred Davis is here for a 2 month follow up on his hypothyroidism.  He is compliant with his LT-4 replacement.   He has noted some weight gain. His energy level is better. Denies constipation or diarrhea.  Denies any local neck symptoms , such as pain or swelling.  Denies any depression or anxiety .     ROS:  As per HPI.   HISTORY:  Past Medical History:  Past Medical History:  Diagnosis Date  . Hypertension   . Thyroid disease    Past Surgical History:  Past Surgical History:  Procedure Laterality Date  . HERNIA REPAIR      Social History:  reports that he quit smoking about 16 months ago. His smoking use included cigarettes. He has never used smokeless tobacco. He reports current alcohol use of about 3.0 standard drinks of alcohol per week. He reports that he does not use drugs.  Family History: family history includes Multiple sclerosis in his mother; Stroke in his father.   HOME  MEDICATIONS: Levothyroxine 100 mcg daily     OBJECTIVE:   PHYSICAL EXAM: VS: BP 124/70   Pulse 66   Ht 5\' 11"  (1.803 m)   Wt 271 lb (122.9 kg)   SpO2 98%   BMI 37.80 kg/m    EXAM: General: Pt appears well and is in NAD  Ears, Nose, Throat: Hearing: Grossly intact bilaterally Dental: Good dentition  Throat: Clear without mass, erythema or exudate  Neck: General: Supple without adenopathy. Thyroid: Thyroid size normal.  No goiter or nodules appreciated. No thyroid bruit.  Lungs: Clear with good BS bilat with no rales, rhonchi, or wheezes  Heart: Auscultation: RRR.  Abdomen: Normoactive bowel sounds, soft, nontender, without masses or organomegaly palpable  Extremities:  L LE: No pretibial edema normal ROM and strength.  Mental Status: Judgment, insight: Intact Orientation: Oriented to time, place, and person Mood and affect: No depression, anxiety, or agitation     DATA REVIEWED:  Results for CHASKE, PASKETT (MRN [id number]) as of 04/03/2018 10:06  Ref. Range 04/02/2018 14:33  TSH Latest Ref Range: 0.35 - 4.50 uIU/mL 1.88  T4,Free(Direct) Latest Ref Range: 0.60 - 1.60 ng/dL 7.82    ASSESSMENT / PLAN / RECOMMENDATIONS:   1. Hypothyroidism Secondary to Hashimoto's Thyroiditis :  Plan:  Pt is clinically and bio-chemically euthyoid  He is compliant with his LT-4 intake  Pt educated extensively on the  correct way to take levothyroxine (first thing in the morning with water, 30 minutes before eating or taking other medications).  Pt encouraged to double dose the following day if she were to miss a dose given long half-life of levothyroxine     Medications  Levothyroxine 100 mcg daily    F/U in 6 months    Signed electronically by: Lyndle HerrlichAbby Jaralla Maysie Parkhill, MD  [facility]  [facility] [address] [place], [place] [address] Phone: [phone] FAX: [phone]      CC: Shade FloodGreene, Jeffrey R, MD [address] [place] [address] Phone: [phone]  Fax: [phone]   Return to Endocrinology clinic as below: Future Appointments  Date Time Provider Department Center  08/11/2018  2:00 PM Shade FloodGreene, Jeffrey R, MD PCP-PCP [facility]

## 2018-04-02 ENCOUNTER — Encounter: Payer: Self-pay | Admitting: Internal Medicine

## 2018-04-02 ENCOUNTER — Ambulatory Visit: Payer: Federal, State, Local not specified - PPO | Admitting: Internal Medicine

## 2018-04-02 VITALS — BP 124/70 | HR 66 | Ht 71.0 in | Wt 271.0 lb

## 2018-04-02 DIAGNOSIS — E063 Autoimmune thyroiditis: Secondary | ICD-10-CM | POA: Diagnosis not present

## 2018-04-02 LAB — TSH: TSH: 1.88 u[IU]/mL (ref 0.35–4.50)

## 2018-04-02 LAB — T4, FREE: FREE T4: 0.72 ng/dL (ref 0.60–1.60)

## 2018-04-02 NOTE — Patient Instructions (Signed)

## 2018-04-03 ENCOUNTER — Encounter: Payer: Self-pay | Admitting: Internal Medicine

## 2018-05-20 DIAGNOSIS — K080 Exfoliation of teeth due to systemic causes: Secondary | ICD-10-CM | POA: Diagnosis not present

## 2018-05-26 DIAGNOSIS — K080 Exfoliation of teeth due to systemic causes: Secondary | ICD-10-CM | POA: Diagnosis not present

## 2018-06-18 ENCOUNTER — Other Ambulatory Visit: Payer: Self-pay | Admitting: Family Medicine

## 2018-06-18 DIAGNOSIS — I10 Essential (primary) hypertension: Secondary | ICD-10-CM

## 2018-06-18 NOTE — Telephone Encounter (Signed)
Requested medication (s) are due for refill today: Yes  Requested medication (s) are on the active medication list: No  Last refill:  02/05/18  Future visit scheduled: Yes  Notes to clinic:  Changes Requested new prescription   irbesartan (AVAPRO) 300 MG tablet       Changed from: losartan (COZAAR) 100 MG tablet       Sig: Please specify directions, refills and quantity   Disp:  Not specified  Refills:  0   Start: 06/18/2018   Class: Normal   Non-formulary For: Essential hypertension   Last ordered: 4 months ago by Shade Flood, MD Last refill: 06/13/2018   Rx #: [id number]   Pharmacy comment: Product Backordered/Unavailable:LOSARTAN ON LONG TERM BACK ORDER, PLEASE SEND RX FOR LOSARTAN HCTZ 100/12.5, OR CHANGE TO OLMESARTAN, TELMISARTAN, OR IRBESARTAN.         Requested Prescriptions  Pending Prescriptions Disp Refills   irbesartan (AVAPRO) 300 MG tablet [Pharmacy Med Name: IRBESARTAN 300 MG TABLET]  0    Sig: Please specify directions, refills and quantity     Cardiovascular:  Angiotensin Receptor Blockers Passed - 06/18/2018  4:47 PM      Passed - Cr in normal range and within 180 days    Creat  Date Value Ref Range Status  07/13/2014 0.86 0.50 - 1.35 mg/dL Final   Creatinine, Ser  Date Value Ref Range Status  02/05/2018 0.86 0.76 - 1.27 mg/dL Final         Passed - K in normal range and within 180 days    Potassium  Date Value Ref Range Status  02/05/2018 4.7 3.5 - 5.2 mmol/L Final         Passed - Patient is not pregnant      Passed - Last BP in normal range    BP Readings from Last 1 Encounters:  04/02/18 124/70         Passed - Valid encounter within last 6 months    Recent Outpatient Visits          4 months ago Essential hypertension   [facility] at [facility], Asencion Partridge, MD   11 months ago Essential hypertension   [facility], Asencion Partridge, MD   1 year ago Essential hypertension   [facility], Asencion Partridge, MD    1 year ago Depression with anxiety   [facility], Asencion Partridge, MD   1 year ago Adjustment disorder, unspecified type   [facility] at [facility], Asencion Partridge, MD      Future Appointments            In 1 month Neva Seat Asencion Partridge, MD Primary Care at [place], [facility]

## 2018-06-19 ENCOUNTER — Telehealth: Payer: Self-pay | Admitting: Family Medicine

## 2018-06-19 ENCOUNTER — Other Ambulatory Visit: Payer: Self-pay | Admitting: Emergency Medicine

## 2018-06-19 DIAGNOSIS — I10 Essential (primary) hypertension: Secondary | ICD-10-CM

## 2018-06-19 MED ORDER — HYDROCHLOROTHIAZIDE 12.5 MG PO TABS: 12.5000 mg | ORAL_TABLET | Freq: Every day | ORAL | 0 refills | Status: DC

## 2018-06-19 MED ORDER — LOSARTAN POTASSIUM 100 MG PO TABS: 100.0000 mg | ORAL_TABLET | Freq: Every day | ORAL | 0 refills | Status: DC

## 2018-06-19 NOTE — Telephone Encounter (Signed)
[facility] sent in instead of the separate medications. Please advise.

## 2018-06-19 NOTE — Telephone Encounter (Signed)
Will route to office for final disposition; also see CRM # [id number]

## 2018-06-19 NOTE — Telephone Encounter (Unsigned)
Copied from CRM [id number]. Topic: Quick Communication - Rx Refill/Question >> Jun 19, 2018 12:52 PM Wyonia Hough E wrote: Medication: losartan (COZAAR) 100 MG tablet is on back order   Has the patient contacted their pharmacy? Yes - [facility] request for the combination tablet for hydrochlorothiazide (HYDRODIURIL) 12.5 MG tablet and losartan (COZAAR) 100 MG tablet sent to fill   Preferred Pharmacy (with phone number or street name): [id number] [place] [address] [place] - [address] [phone] (Phone) [phone] (Fax)    Agent: Please be advised that RX refills may take up to 3 business days. We ask that you follow-up with your pharmacy.

## 2018-06-20 NOTE — Telephone Encounter (Signed)
Please advise- Pharm need a new rx for the combo medication

## 2018-06-20 NOTE — Telephone Encounter (Signed)
Dr Neva Seat the Pharmacy is requesting a combination rx order for the cozaar and hctz is this ok to send in

## 2018-06-21 MED ORDER — LOSARTAN POTASSIUM-HCTZ 100-12.5 MG PO TABS: 1.0000 | ORAL_TABLET | Freq: Every day | ORAL | 1 refills | Status: DC

## 2018-06-21 NOTE — Telephone Encounter (Signed)
Changed to combination losartan HCTZ.  Please call and advise patient of this change and need to stop individual losartan and HCTZ pills.  Thank you.

## 2018-07-17 ENCOUNTER — Other Ambulatory Visit: Payer: Self-pay

## 2018-07-17 ENCOUNTER — Other Ambulatory Visit: Payer: Self-pay | Admitting: Internal Medicine

## 2018-08-11 ENCOUNTER — Encounter: Payer: Self-pay | Admitting: Family Medicine

## 2018-08-15 ENCOUNTER — Encounter: Payer: Self-pay | Admitting: Family Medicine

## 2018-08-29 ENCOUNTER — Other Ambulatory Visit: Payer: Self-pay | Admitting: Family Medicine

## 2018-08-29 DIAGNOSIS — I10 Essential (primary) hypertension: Secondary | ICD-10-CM

## 2018-09-01 ENCOUNTER — Other Ambulatory Visit: Payer: Self-pay

## 2018-09-01 ENCOUNTER — Ambulatory Visit (INDEPENDENT_AMBULATORY_CARE_PROVIDER_SITE_OTHER): Payer: Federal, State, Local not specified - PPO | Admitting: Family Medicine

## 2018-09-01 ENCOUNTER — Telehealth: Payer: Federal, State, Local not specified - PPO | Admitting: Family Medicine

## 2018-09-01 VITALS — BP 120/75 | Wt 248.5 lb

## 2018-09-01 DIAGNOSIS — F418 Other specified anxiety disorders: Secondary | ICD-10-CM

## 2018-09-01 DIAGNOSIS — E7800 Pure hypercholesterolemia, unspecified: Secondary | ICD-10-CM

## 2018-09-01 DIAGNOSIS — I10 Essential (primary) hypertension: Secondary | ICD-10-CM

## 2018-09-01 DIAGNOSIS — E785 Hyperlipidemia, unspecified: Secondary | ICD-10-CM

## 2018-09-01 MED ORDER — SERTRALINE HCL 50 MG PO TABS: 50.0000 mg | ORAL_TABLET | Freq: Every day | ORAL | 1 refills | Status: DC

## 2018-09-01 MED ORDER — LOSARTAN POTASSIUM-HCTZ 100-12.5 MG PO TABS: 1.0000 | ORAL_TABLET | Freq: Every day | ORAL | 1 refills | Status: DC

## 2018-09-01 NOTE — Patient Instructions (Addendum)
For diet - drink plenty of water each day, avoid sugar containing beverages (sodas and sweet tea). Breakfast daily. Healthy snacks during the day. Avoid fast food and preparing your own meals with monitoring calories and nutrition labels. Some form of exercise most days per week (minimum 150 minutes per week).  If more dietary information needed, I am happy to refer you to nutritionist to help further. Let me know if I can help.   No change in meds for now.  I'm glad to hear things are going well.  I will also check your cholesterol on upcoming lab visit but it was only mildly elevated previously, and expect that to improve with your work on the diet.  Follow-up with me in 6 months.  Take care.   If you have lab work done today you will be contacted with your lab results within the next 2 weeks.  If you have not heard from [place] then please contact us. The fastest way to get your results is to register for My Chart.   IF you received an x-ray today, you will receive an invoice from [facility]. Please contact [facility] at [phone] with questions or concerns regarding your invoice.   IF you received labwork today, you will receive an invoice from [place]. Please contact [facility] at [phone] with questions or concerns regarding your invoice.   Our billing staff will not be able to assist you with questions regarding bills from these companies.  You will be contacted with the lab results as soon as they are available. The fastest way to get your results is to activate your My Chart account. Instructions are located on the last page of this paperwork. If you have not heard from [place] regarding the results in 2 weeks, please contact this office.

## 2018-09-01 NOTE — Progress Notes (Signed)
Virtual Visit via Telephone Note  I connected with Fred Davis on 09/01/18 at 10:01 AM by telephone and verified that I am speaking with the correct person using two identifiers.   I discussed the limitations, risks, security and privacy concerns of performing an evaluation and management service by telephone and the availability of in person appointments. I also discussed with the patient that there may be a patient responsible charge related to this service. The patient expressed understanding and agreed to proceed, consent obtained  Chief complaint: HTN. Med refills.   History of Present Illness: [name] is a 63 y.o. male  Hypertension: BP Readings from Last 3 Encounters:  09/01/18 120/75  04/02/18 124/70  02/05/18 129/79   Lab Results  Component Value Date   CREATININE 0.86 02/05/2018  History of right acute intracerebral hemorrhage at right external capsule in August 2018.  Blood pressure has been controlled.  Takes losartan 100 mg, HCTZ 12.5 mg daily. Home readings: 120/75 yesterday, 122/77 has been consistently good.  Daughter will be getting married this August. Has been adjusting diet - 1250 calorie diet. Some yardwork each day, some work around the house.   Constitutional: Negative for fatigue and unexpected weight change.  Eyes: Negative for visual disturbance.  Respiratory: Negative for cough, chest tightness and shortness of breath.   Cardiovascular: Negative for chest pain, palpitations and leg swelling.  Gastrointestinal: Negative for abdominal pain and blood in stool.  Neurological: Negative for dizziness, light-headedness and headaches.   Hyperlipidemia:  Lab Results  Component Value Date   CHOL 179 02/05/2018   HDL 54 02/05/2018   LDLCALC 107 (H) 02/05/2018   TRIG 90 02/05/2018   CHOLHDL 3.3 02/05/2018   Lab Results  Component Value Date   ALT 16 02/05/2018   AST 18 02/05/2018   ALKPHOS 78 02/05/2018   BILITOT 0.3 02/05/2018  mild  elevated LDL. No meds - plans diet as above     Hashimoto's thyroiditis, hypothyroidism.: Lab Results  Component Value Date   TSH 1.88 04/02/2018  Followed by [facility], takes Synthroid 100 mcg daily.  Depression with anxiety: Last discussed in October.  Had used temazepam once or twice since prior visit.  Had been doing well with Zoloft daily and improved mood. Mood still doing well.  Feels happy. Not feeling anxiety.   Depression screen [facility] 2/9 09/01/2018 02/05/2018 06/27/2017 05/30/2017 05/16/2017  Decreased Interest 0 0 0 0 0  Down, Depressed, Hopeless 0 0 0 0 0  PHQ - 2 Score 0 0 0 0 0  Altered sleeping - - - - -  Tired, decreased energy - - - - -  Change in appetite - - - - -  Feeling bad or failure about yourself  - - - - -  Trouble concentrating - - - - -  Moving slowly or fidgety/restless - - - - -  Suicidal thoughts - - - - -  PHQ-9 Score - - - - -  Difficult doing work/chores - - - - -        Patient Active Problem List   Diagnosis Date Noted  . ICH (intracerebral hemorrhage) (HCC) - right acute ICH at right external capsule 11/14/2016  . Left ankle swelling 12/18/2014  . Left ankle pain 12/18/2014   Past Medical History:  Diagnosis Date  . Hypertension   . Thyroid disease    Past Surgical History:  Procedure Laterality Date  . HERNIA REPAIR     Allergies  Allergen Reactions  .  Lisinopril Cough   Prior to Admission medications   Medication Sig Start Date End Date Taking? Authorizing Provider  aspirin EC 81 MG tablet Take 81 mg by mouth daily.   Yes [provider]  levothyroxine (SYNTHROID, LEVOTHROID) 100 MCG tablet TAKE 1 TABLET BY MOUTH EVERY DAY 07/17/18  Yes Shamleffer, Konrad Dolores, MD  losartan-hydrochlorothiazide (HYZAAR) 100-12.5 MG tablet Take 1 tablet by mouth daily. 06/21/18  Yes Shade Flood, MD  sertraline (ZOLOFT) 50 MG tablet Take 1 tablet (50 mg total) by mouth daily. 02/05/18  Yes Shade Flood, MD   clonazePAM (KLONOPIN) 0.5 MG tablet Take 1 tablet (0.5 mg total) by mouth 2 (two) times daily as needed for anxiety. Patient not taking: Reported on 04/02/2018 05/16/17   Shade Flood, MD  diphenhydramine-acetaminophen (TYLENOL PM) 25-500 MG TABS Take 1 tablet by mouth at bedtime as needed.    [provider]  hydrochlorothiazide (HYDRODIURIL) 12.5 MG tablet Take 1 tablet (12.5 mg total) by mouth daily. No further refills without office visit Patient not taking: Reported on 09/01/2018 06/19/18   Shade Flood, MD  losartan (COZAAR) 100 MG tablet Take 1 tablet (100 mg total) by mouth daily. Patient not taking: Reported on 09/01/2018 06/19/18   Shade Flood, MD  temazepam (RESTORIL) 30 MG capsule Take by mouth at bedtime as needed.  12/11/16   [provider]  tetrahydrozoline 0.05 % ophthalmic solution Place 2 drops into both eyes as needed.    [provider]   Social History   Socioeconomic History  . Marital status: Divorced    Spouse name: Not on file  . Number of children: Not on file  . Years of education: Not on file  . Highest education level: Not on file  Occupational History  . Not on file  Social Needs  . Financial resource strain: Not on file  . Food insecurity:    Worry: Not on file    Inability: Not on file  . Transportation needs:    Medical: Not on file    Non-medical: Not on file  Tobacco Use  . Smoking status: Former Smoker    Types: Cigarettes    Last attempt to quit: 11/14/2016    Years since quitting: 1.7  . Smokeless tobacco: Never Used  Substance and Sexual Activity  . Alcohol use: Yes    Alcohol/week: 3.0 standard drinks    Types: 3 Cans of beer per week  . Drug use: No  . Sexual activity: Not on file  Lifestyle  . Physical activity:    Days per week: Not on file    Minutes per session: Not on file  . Stress: Not on file  Relationships  . Social connections:    Talks on phone: Not on file    Gets together: Not on  file    Attends religious service: Not on file    Active member of club or organization: Not on file    Attends meetings of clubs or organizations: Not on file    Relationship status: Not on file  . Intimate partner violence:    Fear of current or ex partner: Not on file    Emotionally abused: Not on file    Physically abused: Not on file    Forced sexual activity: Not on file  Other Topics Concern  . Not on file  Social History Narrative  . Not on file     Observations/Objective: No distress on phone, appropriate responses, euthymic  mood.  No SI/HI.  Assessment and Plan: Essential hypertension - Plan: Comprehensive metabolic panel, Lipid panel, losartan-hydrochlorothiazide (HYZAAR) 100-12.5 MG tablet  -  Stable, tolerating current regimen. Medications refilled. Labs pending as above.  Monitor for low readings with weight loss/diet changes.  May need adjustment in regimen.  Depression with anxiety - Plan: sertraline (ZOLOFT) 50 MG tablet  -Stable, doing well with current regimen of just Zoloft.  Decided to continue.  Anticipate increase activity/exercise should also help.  Hyperlipidemia, unspecified hyperlipidemia type - Plan: Lipid panel  -Mild elevation previously, off medication at this time.  Recheck with upcoming lab visit, but if borderline likely can monitor for improvement with diet/activity changes  Follow Up Instructions: Lab visit, then 3745-month follow-up appointment  Patient Instructions   For diet - drink plenty of water each day, avoid sugar containing beverages (sodas and sweet tea). Breakfast daily. Healthy snacks during the day. Avoid fast food and preparing your own meals with monitoring calories and nutrition labels. Some form of exercise most days per week (minimum 150 minutes per week).  If more dietary information needed, I am happy to refer you to nutritionist to help further. Let me know if I can help.   No change in meds for now.  I'm glad to hear things  are going well.  I will also check your cholesterol on upcoming lab visit but it was only mildly elevated previously, and expect that to improve with your work on the diet.  Follow-up with me in 6 months.  Take care.   If you have lab work done today you will be contacted with your lab results within the next 2 weeks.  If you have not heard from us then please contact us. The fastest way to get your results is to register for My Chart.   IF you received an x-ray today, you will receive an invoice from [facility]. Please contact [facility] at [phone] with questions or concerns regarding your invoice.   IF you received labwork today, you will receive an invoice from [facility]. Please contact [facility] at [phone] with questions or concerns regarding your invoice.   Our billing staff will not be able to assist you with questions regarding bills from these companies.  You will be contacted with the lab results as soon as they are available. The fastest way to get your results is to activate your My Chart account. Instructions are located on the last page of this paperwork. If you have not heard from us regarding the results in 2 weeks, please contact this office.         I discussed the assessment and treatment plan with the patient. The patient was provided an opportunity to ask questions and all were answered. The patient agreed with the plan and demonstrated an understanding of the instructions.   The patient was advised to call back or seek an in-person evaluation if the symptoms worsen or if the condition fails to improve as anticipated.  I provided 15 minutes of non-face-to-face time during this encounter.  Signed,   Meredith StaggersJeffrey Jimesha Rising, MD Primary [facility] at [facility].  09/01/18

## 2018-09-01 NOTE — Progress Notes (Signed)
CC: Medication refill on losartan-hctz.  Wants to discuss putting himself on a little diet to shed some pounds.  No other concerns noted.  Recent weight and bp placed in vitals.   No travel outside the [place] or [place] in the past 3 weeks.

## 2018-09-02 LAB — COMPREHENSIVE METABOLIC PANEL
ALT: 15 IU/L (ref 0–44)
AST: 16 IU/L (ref 0–40)
Albumin/Globulin Ratio: 1.6 (ref 1.2–2.2)
Albumin: 4.1 g/dL (ref 3.8–4.8)
Alkaline Phosphatase: 74 IU/L (ref 39–117)
BUN/Creatinine Ratio: 12 (ref 10–24)
BUN: 12 mg/dL (ref 8–27)
Bilirubin Total: 0.4 mg/dL (ref 0.0–1.2)
CO2: 21 mmol/L (ref 20–29)
Calcium: 9.5 mg/dL (ref 8.6–10.2)
Chloride: 104 mmol/L (ref 96–106)
Creatinine, Ser: 0.99 mg/dL (ref 0.76–1.27)
GFR calc Af Amer: 94 mL/min/{1.73_m2} (ref >59)
GFR calc non Af Amer: 81 mL/min/{1.73_m2} (ref >59)
Globulin, Total: 2.6 g/dL (ref 1.5–4.5)
Glucose: 93 mg/dL (ref 65–99)
Potassium: 4.2 mmol/L (ref 3.5–5.2)
Sodium: 138 mmol/L (ref 134–144)
Total Protein: 6.7 g/dL (ref 6.0–8.5)

## 2018-09-02 LAB — LIPID PANEL
Chol/HDL Ratio: 3.8 ratio (ref 0.0–5.0)
Cholesterol, Total: 140 mg/dL (ref 100–199)
HDL: 37 mg/dL — ABNORMAL LOW (ref >39)
LDL Calculated: 79 mg/dL (ref 0–99)
Triglycerides: 119 mg/dL (ref 0–149)
VLDL Cholesterol Cal: 24 mg/dL (ref 5–40)

## 2018-09-15 NOTE — Progress Notes (Signed)
All is well.

## 2018-09-30 ENCOUNTER — Ambulatory Visit: Payer: Self-pay | Admitting: Internal Medicine

## 2019-01-07 ENCOUNTER — Other Ambulatory Visit: Payer: Self-pay | Admitting: Internal Medicine

## 2019-01-28 ENCOUNTER — Encounter: Payer: Federal, State, Local not specified - PPO | Admitting: Family Medicine

## 2019-02-01 ENCOUNTER — Other Ambulatory Visit: Payer: Self-pay | Admitting: Internal Medicine

## 2019-02-28 ENCOUNTER — Other Ambulatory Visit: Payer: Self-pay | Admitting: Internal Medicine

## 2019-03-04 ENCOUNTER — Ambulatory Visit: Payer: Self-pay | Admitting: Family Medicine

## 2019-03-28 ENCOUNTER — Other Ambulatory Visit: Payer: Self-pay | Admitting: Internal Medicine

## 2019-03-30 ENCOUNTER — Other Ambulatory Visit: Payer: Self-pay

## 2019-04-01 ENCOUNTER — Other Ambulatory Visit: Payer: Self-pay

## 2019-04-01 ENCOUNTER — Encounter: Payer: Self-pay | Admitting: Internal Medicine

## 2019-04-01 ENCOUNTER — Ambulatory Visit: Payer: Federal, State, Local not specified - PPO | Admitting: Internal Medicine

## 2019-04-01 VITALS — BP 122/78 | HR 67 | Temp 98.3°F | Ht 71.0 in | Wt 255.4 lb

## 2019-04-01 DIAGNOSIS — E039 Hypothyroidism, unspecified: Secondary | ICD-10-CM | POA: Diagnosis not present

## 2019-04-01 NOTE — Progress Notes (Signed)
Name: Fred Davis  MRN/ DOB: [id number], 07/15/1955    Age/ Sex: 63 y.o., male     PCP: Fred Agreste, MD   Reason for Davis Evaluation: Hypothyroidism     Initial Davis Davis Visit: 01/30/18    PATIENT IDENTIFIER: [name] is a 63 y.o., male with a past medical history of HTN and CVA (No motor deficit just numbness/2018) . He has followed with Fred Davis since 01/30/18 for consultative assistance with management of his hypothyroidism.   HISTORICAL SUMMARY: The patient was first diagnosed with hypothyroidism on 11/15/16. LT-4 replacement was prescribed at the time but the patient did not start taking it until January, 2019 with a repeat TSH of 30.0 uIU/mL at the time.   On his initial visit to Fred Davis, he was scheduled to be on Levothyroxine 50 mcg daily but had ran out for ~ 69 weeks with a TSH of 26.33 uIU/mL and was started on Levothyroxine 100 mcg daily   His Anti-TPO Ab's were elevated at 710 IU/mL   SUBJECTIVE:    Today (04/01/2019):  [name] is here for a follow up on his hypothyroidism.  He is compliant with his LT-4 replacement.   He   Energy level is fair.   Denies constipation or diarrhea.  Denies any local neck symptoms , such as pain or swelling.  Denies any depression or anxiety .     ROS:  As per HPI.   HISTORY:  Past Medical History:  Past Medical History:  Diagnosis Date  . Hypertension   . Thyroid disease    Past Surgical History:  Past Surgical History:  Procedure Laterality Date  . HERNIA REPAIR      Social History:  reports that he quit smoking about 2 years ago. His smoking use included cigarettes. He has never used smokeless tobacco. He reports current alcohol use of about 3.0 standard drinks of alcohol per week. He reports that he does not use drugs.  Family History: family history includes Multiple sclerosis in his mother; Stroke in his father.   HOME MEDICATIONS: Levothyroxine 100 mcg  daily     OBJECTIVE:   PHYSICAL EXAM: VS: There were no vitals taken for this visit.   EXAM: General: Pt appears well and is in NAD  Ears, Nose, Throat: Hearing: Grossly intact bilaterally Dental: Good dentition  Throat: Clear without mass, erythema or exudate  Neck: General: Supple without adenopathy. Thyroid: Thyroid size normal.  No goiter or nodules appreciated. No thyroid bruit.  Lungs: Clear with good BS bilat with no rales, rhonchi, or wheezes  Heart: Auscultation: RRR.  Abdomen: Normoactive bowel sounds, soft, nontender, without masses or organomegaly palpable  Extremities:  L LE: No pretibial edema normal ROM and strength.  Mental Status: Judgment, insight: Intact Orientation: Oriented to time, place, and person Mood and affect: No depression, anxiety, or agitation     DATA REVIEWED:  Results for Fred Davis, Fred Davis (MRN [id number]) as of 04/03/2019 07:52  Ref. Range 04/01/2019 15:51  TSH Latest Ref Range: 0.35 - 4.50 uIU/mL 1.66  T4,Free(Direct) Latest Ref Range: 0.60 - 1.60 ng/dL 0.80     ASSESSMENT / PLAN / RECOMMENDATIONS:   1. Hypothyroidism Secondary to Hashimoto's Thyroiditis :  Plan:  Pt is clinically and bio-chemically euthyoid  He is compliant with his LT-4 intake  Pt educated extensively on the correct way to take levothyroxine (first thing in the morning with water, 30 minutes before eating or taking other medications).  Pt encouraged to double  dose the following day if she were to miss a dose given long half-life of levothyroxine     Medications  Levothyroxine 100 mcg daily    F/U in 6 months    Signed electronically by: Fred Herrlich, MD  Fred Davis      CC: Fred Flood, MD Fred Davis Fred Davis Fred Davis Fred Davis Phone: [phone]  Fax: [phone]   Return to Davis Davis  as below: Future Appointments  Date Time Provider Department Center  04/01/2019  3:40 PM Fred Davis, Fred Dolores, MD Fred Davis None  04/30/2019  9:20 AM Fred Flood, MD Fred Davis

## 2019-04-01 NOTE — Patient Instructions (Signed)

## 2019-04-02 LAB — TSH: TSH: 1.66 u[IU]/mL (ref 0.35–4.50)

## 2019-04-02 LAB — T4, FREE: Free T4: 0.8 ng/dL (ref 0.60–1.60)

## 2019-04-03 ENCOUNTER — Encounter: Payer: Self-pay | Admitting: Internal Medicine

## 2019-04-03 MED ORDER — LEVOTHYROXINE SODIUM 100 MCG PO TABS: 100.0000 ug | ORAL_TABLET | Freq: Every day | ORAL | 3 refills | Status: DC

## 2019-04-06 ENCOUNTER — Other Ambulatory Visit: Payer: Self-pay | Admitting: Family Medicine

## 2019-04-06 DIAGNOSIS — I10 Essential (primary) hypertension: Secondary | ICD-10-CM

## 2019-04-06 DIAGNOSIS — F418 Other specified anxiety disorders: Secondary | ICD-10-CM

## 2019-04-06 NOTE — Telephone Encounter (Signed)
Patient is requesting a refill of the following medications: Requested Prescriptions   Pending Prescriptions Disp Refills   sertraline (ZOLOFT) 50 MG tablet [Pharmacy Med Name: SERTRALINE HCL 50 MG TABLET] 90 tablet 1    Sig: TAKE 1 TABLET BY MOUTH EVERY DAY    Date of patient request: 04/06/19 Last office visit: 09/01/18 Date of last refill: 09/01/18 Last refill amount: 90 -1RF Follow up time period per chart: 04/30/19

## 2019-04-06 NOTE — Telephone Encounter (Signed)
Requested medication (s) are due for refill today: yes  Requested medication (s) are on the active medication list:yes  Last refill:  01/08/2019  Future visit scheduled: yes  Notes to clinic:  Patient has upcoming appointment Review for refill   Requested Prescriptions  Pending Prescriptions Disp Refills   sertraline (ZOLOFT) 50 MG tablet [Pharmacy Med Name: SERTRALINE HCL 50 MG TABLET] 90 tablet 1    Sig: TAKE 1 [place]      Psychiatry:  Antidepressants - SSRI Failed - 04/06/2019 12:38 AM      Failed - Valid encounter within last 6 months    Recent Outpatient Visits           7 months ago Elevated cholesterol   [facility], Lilia Argue, MD   7 months ago Essential hypertension   [facility], Ranell Patrick, MD   1 year ago Essential hypertension   [facility], Ranell Patrick, MD   1 year ago Essential hypertension   [facility], Ranell Patrick, MD   1 year ago Essential hypertension   [facility], Ranell Patrick, MD       Future Appointments             In 3 weeks Carlota Raspberry Ranell Patrick, MD Primary Care at [place], [facility]

## 2019-04-06 NOTE — Telephone Encounter (Signed)
Stable in May - refilled.

## 2019-04-30 ENCOUNTER — Other Ambulatory Visit: Payer: Self-pay

## 2019-04-30 ENCOUNTER — Encounter: Payer: Self-pay | Admitting: Family Medicine

## 2019-04-30 ENCOUNTER — Ambulatory Visit (INDEPENDENT_AMBULATORY_CARE_PROVIDER_SITE_OTHER): Payer: Federal, State, Local not specified - PPO | Admitting: Family Medicine

## 2019-04-30 ENCOUNTER — Other Ambulatory Visit: Payer: Self-pay | Admitting: Family Medicine

## 2019-04-30 VITALS — BP 151/86 | HR 86 | Temp 98.1°F | Ht 71.0 in | Wt 257.2 lb

## 2019-04-30 DIAGNOSIS — R3915 Urgency of urination: Secondary | ICD-10-CM

## 2019-04-30 DIAGNOSIS — Z7189 Other specified counseling: Secondary | ICD-10-CM

## 2019-04-30 DIAGNOSIS — Z0000 Encounter for general adult medical examination without abnormal findings: Secondary | ICD-10-CM

## 2019-04-30 DIAGNOSIS — Z13220 Encounter for screening for lipoid disorders: Secondary | ICD-10-CM

## 2019-04-30 DIAGNOSIS — I10 Essential (primary) hypertension: Secondary | ICD-10-CM | POA: Diagnosis not present

## 2019-04-30 DIAGNOSIS — F418 Other specified anxiety disorders: Secondary | ICD-10-CM

## 2019-04-30 DIAGNOSIS — R351 Nocturia: Secondary | ICD-10-CM | POA: Diagnosis not present

## 2019-04-30 LAB — POCT URINALYSIS DIP (MANUAL ENTRY)
Bilirubin, UA: NEGATIVE
Blood, UA: NEGATIVE
Glucose, UA: NEGATIVE mg/dL
Ketones, POC UA: NEGATIVE mg/dL
Leukocytes, UA: NEGATIVE
Nitrite, UA: NEGATIVE
Protein Ur, POC: NEGATIVE mg/dL
Spec Grav, UA: 1.025 (ref 1.010–1.025)
Urobilinogen, UA: 0.2 E.U./dL
pH, UA: 6 (ref 5.0–8.0)

## 2019-04-30 LAB — POC MICROSCOPIC URINALYSIS (UMFC): Mucus: ABSENT

## 2019-04-30 NOTE — Progress Notes (Signed)
Subjective:  Patient ID: Fred Davis, male    DOB: 1956-02-21  Age: 64 y.o. MRN: [id number]  CC:  Chief Complaint  Patient presents with  . Annual Exam    pt's general health pt states he feels really good. no complaints. no complications currently.    HPI [name] presents for   Hypertension: Losartan hydrochlorothiazide 100/12.5 mg daily Home readings: 710'G/26-94'W, rare diastolic 92. Usually 125/78.   BP Readings from Last 3 Encounters:  04/30/19 (!) 151/86  04/01/19 122/78  09/01/18 120/75   Lab Results  Component Value Date   CREATININE 0.99 09/01/2018  Lipid screening: Lab Results  Component Value Date   CHOL 140 09/01/2018   HDL 37 (L) 09/01/2018   LDLCALC 79 09/01/2018   TRIG 119 09/01/2018   CHOLHDL 3.8 09/01/2018   Lab Results  Component Value Date   ALT 15 09/01/2018   AST 16 09/01/2018   ALKPHOS 74 09/01/2018   BILITOT 0.4 09/01/2018    Hypothyroidism: Lab Results  Component Value Date   TSH 1.66 04/01/2019   Taking medication daily.  Acquired hypothyroidism. Synthroid 100 mcg.  Endocrine - Dr. Leonette Monarch. Ok to have followed here.  No new hot or cold intolerance. No new hair or skin changes, heart palpitations or new fatigue. No new weight changes.   Depression with anxiety: zoloft 50mg  qd. Doing ok - feels good. Wants to remain at same dose. Able to correct irritability quicker.  Father passed in August. Some increased stress, stress eating at times.   Depression screen [facility] 2/9 04/30/2019 09/01/2018 02/05/2018 06/27/2017 05/30/2017  Decreased Interest 0 0 0 0 0  Down, Depressed, Hopeless 0 0 0 0 0  PHQ - 2 Score 0 0 0 0 0  Altered sleeping - - - - -  Tired, decreased energy - - - - -  Change in appetite - - - - -  Feeling bad or failure about yourself  - - - - -  Trouble concentrating - - - - -  Moving slowly or fidgety/restless - - - - -  Suicidal thoughts - - - - -  PHQ-9 Score - - - - -  Difficult doing work/chores - - - - -     Cancer screening: Cologuard 03/15/2018 Prostate cancer testing: declines DRE after r/b discussion. Agrees on PSA.  Lab Results  Component Value Date   PSA 0.46 07/13/2014    Immunization History  Administered Date(s) Administered  . Influenza,inj,Quad PF,6+ Mos 12/05/2017, 12/21/2018  . Influenza-Unspecified 12/02/2014, 12/03/2016, 12/05/2017  . Tdap 02/05/2018  shingrix: has not vaccine, shingrix recommended, declined.   Depression screen [facility] 2/9 04/30/2019 09/01/2018 02/05/2018 06/27/2017 05/30/2017  Decreased Interest 0 0 0 0 0  Down, Depressed, Hopeless 0 0 0 0 0  PHQ - 2 Score 0 0 0 0 0  Altered sleeping - - - - -  Tired, decreased energy - - - - -  Change in appetite - - - - -  Feeling bad or failure about yourself  - - - - -  Trouble concentrating - - - - -  Moving slowly or fidgety/restless - - - - -  Suicidal thoughts - - - - -  PHQ-9 Score - - - - -  Difficult doing work/chores - - - - -    No exam data present No recent eye eval. 3 yrs ago.   Dental: Followed regularly. Every 6 months. appt 1/28.   Exercise:minimal currently.  Weight gain since dtr's wedding  and father's passing.  Wt Readings from Last 3 Encounters:  04/30/19 257 lb 3.2 oz (116.7 kg)  04/01/19 255 lb 6.4 oz (115.8 kg)  09/01/18 248 lb 8 oz (112.7 kg)   At end of visit - noted urinary urgency: Past few months, feels urge to urinate when gets up at night, slight hesitancy in stream. Nocturia once per night.  No fever/abd pain/back pain. No meds.   History Patient Active Problem List   Diagnosis Date Noted  . ICH (intracerebral hemorrhage) (HCC) - right acute ICH at right external capsule 11/14/2016  . Left ankle swelling 12/18/2014  . Left ankle pain 12/18/2014   Past Medical History:  Diagnosis Date  . Hypertension   . Thyroid disease    Past Surgical History:  Procedure Laterality Date  . HERNIA REPAIR     Allergies  Allergen Reactions  . Lisinopril Cough   Prior to  Admission medications   Medication Sig Start Date End Date Taking? Authorizing Provider  aspirin EC 81 MG tablet Take 81 mg by mouth daily.   Yes [provider]  levothyroxine (SYNTHROID) 100 MCG tablet Take 1 tablet (100 mcg total) by mouth daily. 04/03/19  Yes Shamleffer, Konrad Dolores, MD  losartan-hydrochlorothiazide (HYZAAR) 100-12.5 MG tablet TAKE 1 TABLET BY MOUTH EVERY DAY 04/06/19  Yes Shade Flood, MD  sertraline (ZOLOFT) 50 MG tablet TAKE 1 TABLET BY MOUTH EVERY DAY 04/06/19  Yes Shade Flood, MD  diphenhydramine-acetaminophen (TYLENOL PM) 25-500 MG TABS Take 1 tablet by mouth at bedtime as needed.    [provider]  temazepam (RESTORIL) 30 MG capsule Take by mouth at bedtime as needed.  12/11/16   [provider]  tetrahydrozoline 0.05 % ophthalmic solution Place 2 drops into both eyes as needed.    [provider]   Social History   Socioeconomic History  . Marital status: Divorced    Spouse name: Not on file  . Number of children: Not on file  . Years of education: Not on file  . Highest education level: Not on file  Occupational History  . Not on file  Tobacco Use  . Smoking status: Former Smoker    Types: Cigarettes    Quit date: 11/14/2016    Years since quitting: 2.4  . Smokeless tobacco: Never Used  Substance and Sexual Activity  . Alcohol use: Yes    Alcohol/week: 3.0 standard drinks    Types: 3 Cans of beer per week  . Drug use: No  . Sexual activity: Not on file  Other Topics Concern  . Not on file  Social History Narrative  . Not on file   Social Determinants of Health   Financial Resource Strain:   . Difficulty of Paying Living Expenses: Not on file  Food Insecurity:   . Worried About Programme researcher, broadcasting/film/video in the Last Year: Not on file  . Ran Out of Food in the Last Year: Not on file  Transportation Needs:   . Lack of Transportation (Medical): Not on file  . Lack of Transportation (Non-Medical): Not on  file  Physical Activity:   . Days of Exercise per Week: Not on file  . Minutes of Exercise per Session: Not on file  Stress:   . Feeling of Stress : Not on file  Social Connections:   . Frequency of Communication with Friends and Family: Not on file  . Frequency of Social Gatherings with Friends and Family: Not on file  . Attends  Religious Services: Not on file  . Active Member of Clubs or Organizations: Not on file  . Attends Banker Meetings: Not on file  . Marital Status: Not on file  Intimate Partner Violence:   . Fear of Current or Ex-Partner: Not on file  . Emotionally Abused: Not on file  . Physically Abused: Not on file  . Sexually Abused: Not on file    Review of Systems  Constitutional: Negative for fatigue and unexpected weight change.  Eyes: Negative for visual disturbance.  Respiratory: Negative for cough, chest tightness and shortness of breath.   Cardiovascular: Negative for chest pain, palpitations and leg swelling.  Gastrointestinal: Negative for abdominal pain and blood in stool.  Neurological: Negative for dizziness, light-headedness and headaches.  13 point review of systems per patient health survey noted.  Negative other than as indicated above or in HPI.     Objective:   Vitals:   04/30/19 0947  BP: (!) 151/86  Pulse: 86  Temp: 98.1 F (36.7 C)  TempSrc: Temporal  SpO2: 96%  Weight: 257 lb 3.2 oz (116.7 kg)  Height: 5\' 11"  (1.803 m)     Physical Exam Vitals reviewed.  Constitutional:      Appearance: He is well-developed.  HENT:     Head: Normocephalic and atraumatic.  Eyes:     Pupils: Pupils are equal, round, and reactive to light.  Neck:     Vascular: No carotid bruit or JVD.  Cardiovascular:     Rate and Rhythm: Normal rate and regular rhythm.     Heart sounds: Normal heart sounds. No murmur.  Pulmonary:     Effort: Pulmonary effort is normal.     Breath sounds: Normal breath sounds. No rales.  Skin:    General:  Skin is warm and dry.  Neurological:     Mental Status: He is alert and oriented to person, place, and time.    Results for orders placed or performed in visit on 04/30/19  POCT urinalysis dipstick  Result Value Ref Range   Color, UA yellow yellow   Clarity, UA clear clear   Glucose, UA negative negative mg/dL   Bilirubin, UA negative negative   Ketones, POC UA negative negative mg/dL   Spec Grav, UA 05/02/19 7.169 - 1.025   Blood, UA negative negative   pH, UA 6.0 5.0 - 8.0   Protein Ur, POC negative negative mg/dL   Urobilinogen, UA 0.2 0.2 or 1.0 E.U./dL   Nitrite, UA Negative Negative   Leukocytes, UA Negative Negative  POCT Microscopic Urinalysis ([facility])  Result Value Ref Range   WBC,UR,HPF,POC None None WBC/hpf   RBC,UR,HPF,POC None None RBC/hpf   Bacteria None None, Too numerous to count   Mucus Absent Absent   Epithelial Cells, UR Per Microscopy Few (A) None, Too numerous to count cells/hpf     Assessment & Plan:  Zackariah Vanderpol is a 64 y.o. male . Urinary urgency - Plan: PSA, POCT urinalysis dipstick, POCT Microscopic Urinalysis ([facility]) Nocturia - Plan: PSA, POCT Microscopic Urinalysis ([facility])  -Reassuring urinalysis, check PSA.  Differential includes BPH.  Digital rectal exam deferred at this time, but may need to return for exam depending on PSA level.  Discuss further next few weeks  Essential hypertension - Plan: Comprehensive metabolic panel, Lipid panel  -Home readings closer to stable range, slight elevation in office.  Recheck next few weeks, no changes in meds for now.  Depression with anxiety  -Reports overall stable symptoms, no med  changes at this time  Counseled about COVID-19 virus infection  -Counseling regarding COVID-19 infection prevention as well as vaccine recommendations discussed.  Internet links provided on after visit summary for further information  Annual physical exam  - -anticipatory guidance as below in AVS, screening labs above. Health  maintenance items as above in HPI discussed/recommended as applicable.   Screening for hyperlipidemia - Plan: Lipid panel   No orders of the defined types were placed in this encounter.  Patient Instructions    Some form of exercise most days per week - low intensity such walking - 150 mins per week. Watch diet for weight loss.   Schedule eye appointment.   I do recommend shingrix vaccine, as well as covid 19. Here is more info. Please let me know if there are questions about these vaccines or Covid 19.   Here is a link to information about the COVID-19 vaccine: [email or web address] COVID-19 Vaccine Information can be found at: PodExchange.nl For questions related to vaccine distribution or appointments, please email [email or web address] [email or web address] or call [phone].  I do recommend CDC or NCDHHS for information on Covid-19: [email or web address]/ [email or web address]         If you have lab work done today you will be contacted with your lab results within the next 2 weeks.  If you have not heard from [place] then please contact us. The fastest way to get your results is to register for My Chart.   IF you received an x-ray today, you will receive an invoice from [facility]. Please contact [facility] at [phone] with questions or concerns regarding your invoice.   IF you received labwork today, you will receive an invoice from [facility]. Please contact [facility] at [phone] with questions or concerns regarding your invoice.   Our billing staff will not be able to assist you with questions regarding bills from these companies.  You will be contacted with the lab results as soon as they are available. The fastest way to get your results is to activate your My Chart account. Instructions are located on the last page of this paperwork. If you have not  heard from [place] regarding the results in 2 weeks, please contact this office.         Signed, Meredith Staggers, MD [facility]

## 2019-04-30 NOTE — Patient Instructions (Addendum)
  Some form of exercise most days per week - low intensity such walking - 150 mins per week. Watch diet for weight loss.   Schedule eye appointment.   I do recommend shingrix vaccine, as well as covid 19. Here is more info. Please let me know if there are questions about these vaccines or Covid 19.   Here is a link to information about the COVID-19 vaccine: [email or web address] COVID-19 Vaccine Information can be found at: [email or web address].nl For questions related to vaccine distribution or appointments, please email [email or web address] or call [phone].  I do recommend CDC or NCDHHS for information on Covid-19: [email or web address]/ [email or web address]         If you have lab work done today you will be contacted with your lab results within the next 2 weeks.  If you have not heard from [place] then please contact us. The fastest way to get your results is to register for My Chart.   IF you received an x-ray today, you will receive an invoice from [facility]. Please contact [facility] at [phone] with questions or concerns regarding your invoice.   IF you received labwork today, you will receive an invoice from [place]. Please contact [facility] at [phone] with questions or concerns regarding your invoice.   Our billing staff will not be able to assist you with questions regarding bills from these companies.  You will be contacted with the lab results as soon as they are available. The fastest way to get your results is to activate your My Chart account. Instructions are located on the last page of this paperwork. If you have not heard from [place] regarding the results in 2 weeks, please contact this office.

## 2019-05-01 LAB — COMPREHENSIVE METABOLIC PANEL
ALT: 16 IU/L (ref 0–44)
AST: 18 IU/L (ref 0–40)
Albumin/Globulin Ratio: 1.2 (ref 1.2–2.2)
Albumin: 4.1 g/dL (ref 3.8–4.8)
Alkaline Phosphatase: 85 IU/L (ref 39–117)
BUN/Creatinine Ratio: 17 (ref 10–24)
BUN: 17 mg/dL (ref 8–27)
Bilirubin Total: 0.4 mg/dL (ref 0.0–1.2)
CO2: 19 mmol/L — ABNORMAL LOW (ref 20–29)
Calcium: 9.6 mg/dL (ref 8.6–10.2)
Chloride: 103 mmol/L (ref 96–106)
Creatinine, Ser: 0.99 mg/dL (ref 0.76–1.27)
GFR calc Af Amer: 93 mL/min/{1.73_m2} (ref >59)
GFR calc non Af Amer: 81 mL/min/{1.73_m2} (ref >59)
Globulin, Total: 3.5 g/dL (ref 1.5–4.5)
Glucose: 102 mg/dL — ABNORMAL HIGH (ref 65–99)
Potassium: 4.6 mmol/L (ref 3.5–5.2)
Sodium: 137 mmol/L (ref 134–144)
Total Protein: 7.6 g/dL (ref 6.0–8.5)

## 2019-05-01 LAB — LIPID PANEL
Chol/HDL Ratio: 3 ratio (ref 0.0–5.0)
Cholesterol, Total: 172 mg/dL (ref 100–199)
HDL: 58 mg/dL (ref >39)
LDL Chol Calc (NIH): 99 mg/dL (ref 0–99)
Triglycerides: 78 mg/dL (ref 0–149)
VLDL Cholesterol Cal: 15 mg/dL (ref 5–40)

## 2019-05-01 LAB — PSA: Prostate Specific Ag, Serum: 0.5 ng/mL (ref 0.0–4.0)

## 2019-05-11 ENCOUNTER — Encounter: Payer: Self-pay | Admitting: Radiology

## 2019-05-22 ENCOUNTER — Encounter: Payer: Self-pay | Admitting: Family Medicine

## 2019-05-22 ENCOUNTER — Other Ambulatory Visit: Payer: Self-pay

## 2019-05-22 ENCOUNTER — Ambulatory Visit: Payer: Federal, State, Local not specified - PPO | Admitting: Family Medicine

## 2019-05-22 VITALS — BP 134/80 | HR 77 | Temp 98.3°F | Wt 261.0 lb

## 2019-05-22 DIAGNOSIS — I10 Essential (primary) hypertension: Secondary | ICD-10-CM | POA: Diagnosis not present

## 2019-05-22 DIAGNOSIS — R3912 Poor urinary stream: Secondary | ICD-10-CM | POA: Diagnosis not present

## 2019-05-22 DIAGNOSIS — N401 Enlarged prostate with lower urinary tract symptoms: Secondary | ICD-10-CM | POA: Diagnosis not present

## 2019-05-22 DIAGNOSIS — R739 Hyperglycemia, unspecified: Secondary | ICD-10-CM

## 2019-05-22 MED ORDER — TAMSULOSIN HCL 0.4 MG PO CAPS: 0.4000 mg | ORAL_CAPSULE | Freq: Every day | ORAL | 3 refills | Status: DC

## 2019-05-22 NOTE — Progress Notes (Signed)
Subjective:  Patient ID: Fred Davis, male    DOB: 18-Aug-1955  Age: 64 y.o. MRN: [id number]  CC:  Chief Complaint  Patient presents with  . Follow-up    pt states his urinary urgency has subsided some. pt dose say he has notice it is hard to get a stream started when pt has to go. pt would like to go over his labs as well while he is here.    HPI Fred Davis presents for   Urinary urgency/nocturia Possible BPH when discussed that January 14 visit.  Reassuring urinalysis.  PSA stable.  Less nocturia, urgency. Urinating normally, but decreased stream Nocturia - none now. Lab Results  Component Value Date   PSA1 0.5 04/30/2019   PSA 0.46 07/13/2014    Hypertension: Elevated in office last visit, home readings reassuring.  Improved today.  No med changes since last visit. Home readings:122/77.  BP Readings from Last 3 Encounters:  05/22/19 134/80  04/30/19 (!) 151/86  04/01/19 122/78   Lab Results  Component Value Date   CREATININE 0.99 04/30/2019      History Patient Active Problem List   Diagnosis Date Noted  . ICH (intracerebral hemorrhage) (HCC) - right acute ICH at right external capsule 11/14/2016  . Left ankle swelling 12/18/2014  . Left ankle pain 12/18/2014   Past Medical History:  Diagnosis Date  . Hypertension   . Thyroid disease    Past Surgical History:  Procedure Laterality Date  . HERNIA REPAIR     Allergies  Allergen Reactions  . Lisinopril Cough   Prior to Admission medications   Medication Sig Start Date End Date Taking? Authorizing Provider  aspirin EC 81 MG tablet Take 81 mg by mouth daily.   Yes [provider]  levothyroxine (SYNTHROID) 100 MCG tablet Take 1 tablet (100 mcg total) by mouth daily. 04/03/19  Yes Shamleffer, Melanie Crazier, MD  losartan-hydrochlorothiazide (HYZAAR) 100-12.5 MG tablet TAKE 1 TABLET BY MOUTH EVERY DAY 04/30/19  Yes [name], MD  sertraline (ZOLOFT) 50 MG tablet TAKE 1 TABLET BY MOUTH  EVERY DAY 04/06/19  Yes [name], MD  temazepam (RESTORIL) 30 MG capsule Take by mouth at bedtime as needed.  12/11/16  Yes [provider]  tetrahydrozoline 0.05 % ophthalmic solution Place 2 drops into both eyes as needed.   Yes [provider]  diphenhydramine-acetaminophen (TYLENOL PM) 25-500 MG TABS Take 1 tablet by mouth at bedtime as needed.    [provider]   Social History   Socioeconomic History  . Marital status: Divorced    Spouse name: Not on file  . Number of children: Not on file  . Years of education: Not on file  . Highest education level: Not on file  Occupational History  . Not on file  Tobacco Use  . Smoking status: Former Smoker    Types: Cigarettes    Quit date: 11/14/2016    Years since quitting: 2.5  . Smokeless tobacco: Never Used  Substance and Sexual Activity  . Alcohol use: Yes    Alcohol/week: 3.0 standard drinks    Types: 3 Cans of beer per week  . Drug use: No  . Sexual activity: Not on file  Other Topics Concern  . Not on file  Social History Narrative  . Not on file   Social Determinants of Health   Financial Resource Strain:   . Difficulty of Paying Living Expenses: Not on file  Food Insecurity:   . Worried  About Running Out of Food in the Last Year: Not on file  . Ran Out of Food in the Last Year: Not on file  Transportation Needs:   . Lack of Transportation (Medical): Not on file  . Lack of Transportation (Non-Medical): Not on file  Physical Activity:   . Days of Exercise per Week: Not on file  . Minutes of Exercise per Session: Not on file  Stress:   . Feeling of Stress : Not on file  Social Connections:   . Frequency of Communication with Friends and Family: Not on file  . Frequency of Social Gatherings with Friends and Family: Not on file  . Attends Religious Services: Not on file  . Active Member of Clubs or Organizations: Not on file  . Attends Banker Meetings: Not on file  .  Marital Status: Not on file  Intimate Partner Violence:   . Fear of Current or Ex-Partner: Not on file  . Emotionally Abused: Not on file  . Physically Abused: Not on file  . Sexually Abused: Not on file    Review of Systems  Per HPI.  Objective:   Vitals:   05/22/19 1425 05/22/19 1430  BP: (!) 150/89 134/80  Pulse: 77   Temp: 98.3 F (36.8 C)   TempSrc: Temporal   SpO2: 93%   Weight: 261 lb (118.4 kg)      Physical Exam Vitals reviewed.  Constitutional:      Appearance: He is well-developed.  HENT:     Head: Normocephalic and atraumatic.  Eyes:     Pupils: Pupils are equal, round, and reactive to light.  Neck:     Vascular: No carotid bruit or JVD.  Cardiovascular:     Rate and Rhythm: Normal rate and regular rhythm.     Heart sounds: Normal heart sounds. No murmur.  Pulmonary:     Effort: Pulmonary effort is normal.     Breath sounds: Normal breath sounds. No rales.  Skin:    General: Skin is warm and dry.  Neurological:     Mental Status: He is alert and oriented to person, place, and time.     Assessment & Plan:  Fred Davis is a 64 y.o. male . Benign prostatic hyperplasia with weak urinary stream - Plan: tamsulosin (FLOMAX) 0.4 MG CAPS capsule  -Suspected BPH with urinary hesitancy/decreased stream.  Trial tamsulosin, potential side effects discussed.  Urology evaluation recommended, declined at this time.  If not improving with medication would recommend urology referral.  RTC precautions.  Essential hypertension  -Improved control, no med changes for now.  Recheck 3 months.  Borderline hyperglycemia discussed, plan for weight loss/exercise.  No orders of the defined types were placed in this encounter.  Patient Instructions     Try tamsulosin once per day for prostate. Recheck in 3 months. Return to the clinic or go to the nearest emergency room if any of your symptoms worsen or new symptoms occur.  Blood pressure ok.  Watch diet, exercise and  recheck labs  3 months.   If you have lab work done today you will be contacted with your lab results within the next 2 weeks.  If you have not heard from [place] then please contact us. The fastest way to get your results is to register for My Chart.   IF you received an x-ray today, you will receive an invoice from [facility]. Please contact [facility] at [phone] with questions or concerns regarding your invoice.  IF you received labwork today, you will receive an invoice from [facility]. Please contact [facility] at [phone] with questions or concerns regarding your invoice.   Our billing staff will not be able to assist you with questions regarding bills from these companies.  You will be contacted with the lab results as soon as they are available. The fastest way to get your results is to activate your My Chart account. Instructions are located on the last page of this paperwork. If you have not heard from [place] regarding the results in 2 weeks, please contact this office.         Signed, Merri Ray, MD [facility]

## 2019-05-22 NOTE — Patient Instructions (Addendum)
   Try tamsulosin once per day for prostate. Recheck in 3 months. Return to the clinic or go to the nearest emergency room if any of your symptoms worsen or new symptoms occur.  Blood pressure ok.  Watch diet, exercise and recheck labs  3 months.   If you have lab work done today you will be contacted with your lab results within the next 2 weeks.  If you have not heard from [place] then please contact us. The fastest way to get your results is to register for My Chart.   IF you received an x-ray today, you will receive an invoice from [facility]. Please contact [facility] at [phone] with questions or concerns regarding your invoice.   IF you received labwork today, you will receive an invoice from [facility]. Please contact [facility] at [phone] with questions or concerns regarding your invoice.   Our billing staff will not be able to assist you with questions regarding bills from these companies.  You will be contacted with the lab results as soon as they are available. The fastest way to get your results is to activate your My Chart account. Instructions are located on the last page of this paperwork. If you have not heard from [place] regarding the results in 2 weeks, please contact this office.

## 2019-05-23 ENCOUNTER — Encounter: Payer: Self-pay | Admitting: Family Medicine

## 2019-05-29 ENCOUNTER — Other Ambulatory Visit: Payer: Self-pay | Admitting: Family Medicine

## 2019-05-29 DIAGNOSIS — I10 Essential (primary) hypertension: Secondary | ICD-10-CM

## 2019-06-22 ENCOUNTER — Other Ambulatory Visit: Payer: Self-pay | Admitting: Family Medicine

## 2019-06-22 DIAGNOSIS — I10 Essential (primary) hypertension: Secondary | ICD-10-CM

## 2019-07-25 ENCOUNTER — Other Ambulatory Visit: Payer: Self-pay | Admitting: Family Medicine

## 2019-07-25 DIAGNOSIS — I10 Essential (primary) hypertension: Secondary | ICD-10-CM

## 2019-07-25 NOTE — Telephone Encounter (Signed)
Requested Prescriptions  Pending Prescriptions Disp Refills  . losartan-hydrochlorothiazide (HYZAAR) 100-12.5 MG tablet [Pharmacy Med Name: LOSARTAN-HCTZ 100-12.5 MG TAB] 30 tablet 0    Sig: TAKE 1 TABLET BY MOUTH EVERY DAY     Cardiovascular: ARB + Diuretic Combos Passed - 07/25/2019 10:31 AM      Passed - K in normal range and within 180 days    Potassium  Date Value Ref Range Status  04/30/2019 4.6 3.5 - 5.2 mmol/L Final         Passed - Na in normal range and within 180 days    Sodium  Date Value Ref Range Status  04/30/2019 137 134 - 144 mmol/L Final         Passed - Cr in normal range and within 180 days    Creat  Date Value Ref Range Status  07/13/2014 0.86 0.50 - 1.35 mg/dL Final   Creatinine, Ser  Date Value Ref Range Status  04/30/2019 0.99 0.76 - 1.27 mg/dL Final         Passed - Ca in normal range and within 180 days    Calcium  Date Value Ref Range Status  04/30/2019 9.6 8.6 - 10.2 mg/dL Final   Calcium, Ion  Date Value Ref Range Status  11/14/2016 1.10 (L) 1.15 - 1.40 mmol/L Final         Passed - Patient is not pregnant      Passed - Last BP in normal range    BP Readings from Last 1 Encounters:  05/22/19 134/80         Passed - Valid encounter within last 6 months    Recent Outpatient Visits          2 months ago Benign prostatic hyperplasia with weak urinary stream   [facility], Asencion Partridge, MD   2 months ago Urinary urgency   [facility], Asencion Partridge, MD   10 months ago Elevated cholesterol   [facility], Meda Coffee, MD   10 months ago Essential hypertension   [facility], Asencion Partridge, MD   1 year ago Essential hypertension   [facility], Asencion Partridge, MD      Future Appointments            In 3 weeks Neva Seat Asencion Partridge, MD [facility] [place]

## 2019-08-02 ENCOUNTER — Other Ambulatory Visit: Payer: Self-pay | Admitting: Family Medicine

## 2019-08-02 DIAGNOSIS — F418 Other specified anxiety disorders: Secondary | ICD-10-CM

## 2019-08-02 DIAGNOSIS — N401 Enlarged prostate with lower urinary tract symptoms: Secondary | ICD-10-CM

## 2019-08-21 ENCOUNTER — Ambulatory Visit: Payer: Federal, State, Local not specified - PPO | Admitting: Family Medicine

## 2019-08-25 ENCOUNTER — Other Ambulatory Visit: Payer: Self-pay | Admitting: Family Medicine

## 2019-08-25 DIAGNOSIS — I10 Essential (primary) hypertension: Secondary | ICD-10-CM

## 2019-08-26 ENCOUNTER — Other Ambulatory Visit: Payer: Self-pay

## 2019-08-26 ENCOUNTER — Ambulatory Visit: Payer: Federal, State, Local not specified - PPO | Admitting: Family Medicine

## 2019-08-26 VITALS — BP 129/79 | HR 60 | Temp 98.0°F | Resp 15 | Ht 71.0 in | Wt 265.6 lb

## 2019-08-26 DIAGNOSIS — F418 Other specified anxiety disorders: Secondary | ICD-10-CM | POA: Diagnosis not present

## 2019-08-26 DIAGNOSIS — I10 Essential (primary) hypertension: Secondary | ICD-10-CM | POA: Diagnosis not present

## 2019-08-26 DIAGNOSIS — N401 Enlarged prostate with lower urinary tract symptoms: Secondary | ICD-10-CM

## 2019-08-26 DIAGNOSIS — L237 Allergic contact dermatitis due to plants, except food: Secondary | ICD-10-CM | POA: Diagnosis not present

## 2019-08-26 DIAGNOSIS — R3912 Poor urinary stream: Secondary | ICD-10-CM

## 2019-08-26 MED ORDER — SERTRALINE HCL 50 MG PO TABS: 50.0000 mg | ORAL_TABLET | Freq: Every day | ORAL | 1 refills | Status: DC

## 2019-08-26 MED ORDER — TAMSULOSIN HCL 0.4 MG PO CAPS: 0.4000 mg | ORAL_CAPSULE | Freq: Every day | ORAL | 3 refills | Status: DC

## 2019-08-26 MED ORDER — LOSARTAN POTASSIUM-HCTZ 100-12.5 MG PO TABS: 1.0000 | ORAL_TABLET | Freq: Every day | ORAL | 1 refills | Status: DC

## 2019-08-26 MED ORDER — TRIAMCINOLONE ACETONIDE 0.1 % EX CREA: 1.0000 "application " | TOPICAL_CREAM | Freq: Two times a day (BID) | CUTANEOUS | 1 refills | Status: DC

## 2019-08-26 NOTE — Progress Notes (Signed)
Subjective:  Patient ID: Fred Davis, male    DOB: [birth year]  Age: 64 y.o. MRN: [id number]  CC:  Chief Complaint  Patient presents with  . Hypertension    pt has been checking his BP at home, doing well  . Rash    pt had a rash started 3 weeks ago after working outside, it has gotten better but is still having some itching, redness mostly dry skin     HPI Fred Davis presents for   Hypertension: Hyzaar 100/12.5  QD.  Home readings: 110/65. No lightheadedness or dizziness. No new side effects.   BP Readings from Last 3 Encounters:  08/26/19 129/79  05/22/19 134/80  04/30/19 (!) 151/86   Lab Results  Component Value Date   CREATININE 0.99 04/30/2019   Urinary urgency.nocturia: Doing a lot better on flomax. No new side effects. Nocturia 1-2 [er night. Better stream, emptying better.   Lab Results  Component Value Date   PSA1 0.5 04/30/2019   PSA 0.46 07/13/2014      Rash: 3 weeks ago - working on incline on yard in shorts, trimming brush. Weed eating. Few days later - rash on lower legs. Itchy, blistering some - some discharge. No genital lesions, no face or upper body lesions. Improved in past few weeks. Tx: anti-itch spray.  Better., still min itching, dry.    History Patient Active Problem List   Diagnosis Date Noted  . ICH (intracerebral hemorrhage) (HCC) - right acute ICH at right external capsule 11/14/2016  . Left ankle swelling 12/18/2014  . Left ankle pain 12/18/2014   Past Medical History:  Diagnosis Date  . Hypertension   . Thyroid disease    Past Surgical History:  Procedure Laterality Date  . HERNIA REPAIR     Allergies  Allergen Reactions  . Lisinopril Cough   Prior to Admission medications   Medication Sig Start Date End Date Taking? Authorizing Provider  aspirin EC 81 MG tablet Take 81 mg by mouth daily.   Yes [provider]  diphenhydramine-acetaminophen (TYLENOL PM) 25-500 MG TABS Take 1 tablet by mouth at bedtime as  needed.   Yes [provider]  levothyroxine (SYNTHROID) 100 MCG tablet Take 1 tablet (100 mcg total) by mouth daily. 04/03/19  Yes Shamleffer, Konrad Dolores, MD  losartan-hydrochlorothiazide [facility]) 100-12.5 MG tablet TAKE 1 TABLET BY MOUTH EVERY DAY 08/25/19  Yes Shade Flood, MD  sertraline (ZOLOFT) 50 MG tablet TAKE 1 TABLET BY MOUTH EVERY DAY 04/06/19  Yes Shade Flood, MD  tamsulosin (FLOMAX) 0.4 MG CAPS capsule Take 1 capsule (0.4 mg total) by mouth daily. 05/22/19  Yes Shade Flood, MD  temazepam (RESTORIL) 30 MG capsule Take by mouth at bedtime as needed.  12/11/16  Yes [provider]  tetrahydrozoline 0.05 % ophthalmic solution Place 2 drops into both eyes as needed.   Yes [provider]   Social History   Socioeconomic History  . Marital status: Divorced    Spouse name: Not on file  . Number of children: Not on file  . Years of education: Not on file  . Highest education level: Not on file  Occupational History  . Not on file  Tobacco Use  . Smoking status: Former Smoker    Types: Cigarettes    Quit date: 11/14/2016    Years since quitting: 2.7  . Smokeless tobacco: Never Used  Substance and Sexual Activity  . Alcohol use: Yes    Alcohol/week: 3.0 standard drinks  Types: 3 Cans of beer per week  . Drug use: No  . Sexual activity: Not on file  Other Topics Concern  . Not on file  Social History Narrative  . Not on file   Social Determinants of Health   Financial Resource Strain:   . Difficulty of Paying Living Expenses:   Food Insecurity:   . Worried About Programme researcher, broadcasting/film/video in the Last Year:   . Barista in the Last Year:   Transportation Needs:   . Freight forwarder (Medical):   Marland Kitchen Lack of Transportation (Non-Medical):   Physical Activity:   . Days of Exercise per Week:   . Minutes of Exercise per Session:   Stress:   . Feeling of Stress :   Social Connections:   . Frequency of Communication with  Friends and Family:   . Frequency of Social Gatherings with Friends and Family:   . Attends Religious Services:   . Active Member of Clubs or Organizations:   . Attends Banker Meetings:   Marland Kitchen Marital Status:   Intimate Partner Violence:   . Fear of Current or Ex-Partner:   . Emotionally Abused:   Marland Kitchen Physically Abused:   . Sexually Abused:     Review of Systems  Per HPI Objective:   Vitals:   08/26/19 1615  BP: 129/79  Pulse: 60  Resp: 15  Temp: 98 F (36.7 C)  TempSrc: Temporal  SpO2: 95%  Weight: 265 lb 9.6 oz (120.5 kg)  Height: 5\' 11"  (1.803 m)     Physical Exam Vitals reviewed.  Constitutional:      Appearance: He is well-developed.  HENT:     Head: Normocephalic and atraumatic.  Eyes:     Pupils: Pupils are equal, round, and reactive to light.  Neck:     Vascular: No carotid bruit or JVD.  Cardiovascular:     Rate and Rhythm: Normal rate and regular rhythm.     Heart sounds: Normal heart sounds. No murmur.  Pulmonary:     Effort: Pulmonary effort is normal.     Breath sounds: Normal breath sounds. No rales.  Skin:    General: Skin is warm and dry.  Neurological:     Mental Status: He is alert and oriented to person, place, and time.          Assessment & Plan:  Fred Davis is a 64 y.o. male . Essential hypertension - Plan: losartan-hydrochlorothiazide (HYZAAR) 100-12.5 MG tablet  -  Stable, tolerating current regimen. Medications refilled.   Depression with anxiety - Plan: sertraline (ZOLOFT) 50 MG tablet  -Stable, continue Zoloft.  Benign prostatic hyperplasia with weak urinary stream - Plan: tamsulosin (FLOMAX) 0.4 MG CAPS capsule  -Improved, continue Flomax.  Contact dermatitis due to poison ivy  Improved.  Triamcinolone twice daily if needed for pruritic areas, Aveeno lotion recommended for dry skin, dry skin care reviewed as well as recommendation for hydrating soap.  RTC precautions if not continuing to improve.   Meds  ordered this encounter  Medications  . triamcinolone cream (KENALOG) 0.1 %    Sig: Apply 1 application topically 2 (two) times daily.    Dispense:  30 g    Refill:  1  . losartan-hydrochlorothiazide (HYZAAR) 100-12.5 MG tablet    Sig: Take 1 tablet by mouth daily.    Dispense:  90 tablet    Refill:  1  . sertraline (ZOLOFT) 50 MG tablet    Sig: Take  1 tablet (50 mg total) by mouth daily.    Dispense:  90 tablet    Refill:  1  . tamsulosin (FLOMAX) 0.4 MG CAPS capsule    Sig: Take 1 capsule (0.4 mg total) by mouth daily.    Dispense:  30 capsule    Refill:  3   Patient Instructions    Follow-up in 3 months for blood work.  No change in medications for now.  I am glad the urinary symptoms are better.  Rash was likely due to poison ivy, see information below.  Skin does appear dry at this time, use Aveeno lotion twice per day, try milder soap like Dove for men.  Triamcinolone twice per day as needed for itching.  Follow-up if rash is worsening.   Poison Ivy Dermatitis Poison ivy dermatitis is inflammation of the skin that is caused by chemicals in the leaves of the poison ivy plant. The skin reaction often involves redness, swelling, blisters, and extreme itching. What are the causes? This condition is caused by a chemical (urushiol) found in the sap of the poison ivy plant. This chemical is sticky and can be easily spread to people, animals, and objects. You can get poison ivy dermatitis by:  Having direct contact with a poison ivy plant.  Touching animals, other people, or objects that have come in contact with poison ivy and have the chemical on them. What increases the risk? This condition is more likely to develop in people who:  Are outdoors often in wooded or [place] areas.  Go outdoors without wearing protective clothing, such as closed shoes, long pants, and a long-sleeved shirt. What are the signs or symptoms? Symptoms of this condition include:  Redness of the  skin.  Extreme itching.  A rash that often includes bumps and blisters. The rash usually appears 48 hours after exposure, if you have been exposed before. If this is the first time you have been exposed, the rash may not appear until a week after exposure.  Swelling. This may occur if the reaction is more severe. Symptoms usually last for 1-2 weeks. However, the first time you develop this condition, symptoms may last 3-4 weeks. How is this diagnosed? This condition may be diagnosed based on your symptoms and a physical exam. Your health care provider may also ask you about any recent outdoor activity. How is this treated? Treatment for this condition will vary depending on how severe it is. Treatment may include:  Hydrocortisone cream or calamine lotion to relieve itching.  Oatmeal baths to soothe the skin.  Medicines, such as over-the-counter antihistamine tablets.  Oral steroid medicine, for more severe reactions. Follow these instructions at home: Medicines  Take or apply over-the-counter and prescription medicines only as told by your health care provider.  Use hydrocortisone cream or calamine lotion as needed to soothe the skin and relieve itching. General instructions  Do not scratch or rub your skin.  Apply a cold, wet cloth (cold compress) to the affected areas or take baths in cool water. This will help with itching. Avoid hot baths and showers.  Take oatmeal baths as needed. Use colloidal oatmeal. You can get this at your local pharmacy or grocery store. Follow the instructions on the packaging.  While you have the rash, wash clothes right after you wear them.  Keep all follow-up visits as told by your health care provider. This is important. How is this prevented?   Learn to identify the poison ivy plant and avoid contact  with the plant. This plant can be recognized by the number of leaves. Generally, poison ivy has three leaves with flowering branches on a single  stem. The leaves are typically glossy, and they have jagged edges that come to a point at the front.  If you have been exposed to poison ivy, thoroughly wash with soap and water right away. You have about 30 minutes to remove the plant resin before it will cause the rash. Be sure to wash under your fingernails, because any plant resin there will continue to spread the rash.  When hiking or camping, wear clothes that will help you to avoid exposure on the skin. This includes long pants, a long-sleeved shirt, tall socks, and hiking boots. You can also apply preventive lotion to your skin to help limit exposure.  If you suspect that your clothes or outdoor gear came in contact with poison ivy, rinse them off outside with a garden hose before you bring them inside your house.  When doing yard work or gardening, wear gloves, long sleeves, long pants, and boots. Wash your garden tools and gloves if they come in contact with poison ivy.  If you suspect that your pet has come into contact with poison ivy, wash him or her with pet shampoo and water. Make sure to wear gloves while washing your pet. Contact a health care provider if you have:  Open sores in the rash area.  More redness, swelling, or pain in the affected area.  Redness that spreads beyond the rash area.  Fluid, blood, or pus coming from the affected area.  A fever.  A rash over a large area of your body.  A rash on your eyes, mouth, or genitals.  A rash that does not improve after a few weeks. Get help right away if:  Your face swells or your eyes swell shut.  You have trouble breathing.  You have trouble swallowing. These symptoms may represent a serious problem that is an emergency. Do not wait to see if the symptoms will go away. Get medical help right away. Call your local emergency services (911 in the [place] Do not drive yourself to the hospital. Summary  Poison ivy dermatitis is inflammation of the skin that is  caused by chemicals in the leaves of the poison ivy plant.  Symptoms of this condition include redness, itching, a rash, and swelling.  Do not scratch or rub your skin.  Take or apply over-the-counter and prescription medicines only as told by your health care provider. This information is not intended to replace advice given to you by your health care provider. Make sure you discuss any questions you have with your health care provider. Document Revised: 07/25/2018 Document Reviewed: 03/28/2018 Elsevier Patient Education  [employer].    If you have lab work done today you will be contacted with your lab results within the next 2 weeks.  If you have not heard from [place] then please contact us. The fastest way to get your results is to register for My Chart.   IF you received an x-ray today, you will receive an invoice from [facility]. Please contact [facility] at [phone] with questions or concerns regarding your invoice.   IF you received labwork today, you will receive an invoice from [place]. Please contact [facility] at [phone] with questions or concerns regarding your invoice.   Our billing staff will not be able to assist you with questions regarding bills from these companies.  You will  be contacted with the lab results as soon as they are available. The fastest way to get your results is to activate your My Chart account. Instructions are located on the last page of this paperwork. If you have not heard from us regarding the results in 2 weeks, please contact this office.        If you have lab work done today you will be contacted with your lab results within the next 2 weeks.  If you have not heard from us then please contact us. The fastest way to get your results is to register for My Chart.   IF you received an x-ray today, you will receive an invoice from [facility]. Please contact [facility] at [phone] with  questions or concerns regarding your invoice.   IF you received labwork today, you will receive an invoice from [facility]. Please contact [facility] at [phone] with questions or concerns regarding your invoice.   Our billing staff will not be able to assist you with questions regarding bills from these companies.  You will be contacted with the lab results as soon as they are available. The fastest way to get your results is to activate your My Chart account. Instructions are located on the last page of this paperwork. If you have not heard from us regarding the results in 2 weeks, please contact this office.         Signed, Meredith StaggersJeffrey Sawyer Mentzer, MD [facility]

## 2019-08-26 NOTE — Patient Instructions (Addendum)
Follow-up in 3 months for blood work.  No change in medications for now.  I am glad the urinary symptoms are better.  Rash was likely due to poison ivy, see information below.  Skin does appear dry at this time, use Aveeno lotion twice per day, try milder soap like Dove for men.  Triamcinolone twice per day as needed for itching.  Follow-up if rash is worsening.   Poison Ivy Dermatitis Poison ivy dermatitis is inflammation of the skin that is caused by chemicals in the leaves of the poison ivy plant. The skin reaction often involves redness, swelling, blisters, and extreme itching. What are the causes? This condition is caused by a chemical (urushiol) found in the sap of the poison ivy plant. This chemical is sticky and can be easily spread to people, animals, and objects. You can get poison ivy dermatitis by:  Having direct contact with a poison ivy plant.  Touching animals, other people, or objects that have come in contact with poison ivy and have the chemical on them. What increases the risk? This condition is more likely to develop in people who:  Are outdoors often in wooded or [place] areas.  Go outdoors without wearing protective clothing, such as closed shoes, long pants, and a long-sleeved shirt. What are the signs or symptoms? Symptoms of this condition include:  Redness of the skin.  Extreme itching.  A rash that often includes bumps and blisters. The rash usually appears 48 hours after exposure, if you have been exposed before. If this is the first time you have been exposed, the rash may not appear until a week after exposure.  Swelling. This may occur if the reaction is more severe. Symptoms usually last for 1-2 weeks. However, the first time you develop this condition, symptoms may last 3-4 weeks. How is this diagnosed? This condition may be diagnosed based on your symptoms and a physical exam. Your health care provider may also ask you about any recent outdoor  activity. How is this treated? Treatment for this condition will vary depending on how severe it is. Treatment may include:  Hydrocortisone cream or calamine lotion to relieve itching.  Oatmeal baths to soothe the skin.  Medicines, such as over-the-counter antihistamine tablets.  Oral steroid medicine, for more severe reactions. Follow these instructions at home: Medicines  Take or apply over-the-counter and prescription medicines only as told by your health care provider.  Use hydrocortisone cream or calamine lotion as needed to soothe the skin and relieve itching. General instructions  Do not scratch or rub your skin.  Apply a cold, wet cloth (cold compress) to the affected areas or take baths in cool water. This will help with itching. Avoid hot baths and showers.  Take oatmeal baths as needed. Use colloidal oatmeal. You can get this at your local pharmacy or grocery store. Follow the instructions on the packaging.  While you have the rash, wash clothes right after you wear them.  Keep all follow-up visits as told by your health care provider. This is important. How is this prevented?   Learn to identify the poison ivy plant and avoid contact with the plant. This plant can be recognized by the number of leaves. Generally, poison ivy has three leaves with flowering branches on a single stem. The leaves are typically glossy, and they have jagged edges that come to a point at the front.  If you have been exposed to poison ivy, thoroughly wash with soap and water right away. You  have about 30 minutes to remove the plant resin before it will cause the rash. Be sure to wash under your fingernails, because any plant resin there will continue to spread the rash.  When hiking or camping, wear clothes that will help you to avoid exposure on the skin. This includes long pants, a long-sleeved shirt, tall socks, and hiking boots. You can also apply preventive lotion to your skin to help  limit exposure.  If you suspect that your clothes or outdoor gear came in contact with poison ivy, rinse them off outside with a garden hose before you bring them inside your house.  When doing yard work or gardening, wear gloves, long sleeves, long pants, and boots. Wash your garden tools and gloves if they come in contact with poison ivy.  If you suspect that your pet has come into contact with poison ivy, wash him or her with pet shampoo and water. Make sure to wear gloves while washing your pet. Contact a health care provider if you have:  Open sores in the rash area.  More redness, swelling, or pain in the affected area.  Redness that spreads beyond the rash area.  Fluid, blood, or pus coming from the affected area.  A fever.  A rash over a large area of your body.  A rash on your eyes, mouth, or genitals.  A rash that does not improve after a few weeks. Get help right away if:  Your face swells or your eyes swell shut.  You have trouble breathing.  You have trouble swallowing. These symptoms may represent a serious problem that is an emergency. Do not wait to see if the symptoms will go away. Get medical help right away. Call your local emergency services (911 in the [place] Do not drive yourself to the hospital. Summary  Poison ivy dermatitis is inflammation of the skin that is caused by chemicals in the leaves of the poison ivy plant.  Symptoms of this condition include redness, itching, a rash, and swelling.  Do not scratch or rub your skin.  Take or apply over-the-counter and prescription medicines only as told by your health care provider. This information is not intended to replace advice given to you by your health care provider. Make sure you discuss any questions you have with your health care provider. Document Revised: 07/25/2018 Document Reviewed: 03/28/2018 Elsevier Patient Education  [employer].    If you have lab work done today you will be  contacted with your lab results within the next 2 weeks.  If you have not heard from [place] then please contact us. The fastest way to get your results is to register for My Chart.   IF you received an x-ray today, you will receive an invoice from [facility]. Please contact [facility] at [phone] with questions or concerns regarding your invoice.   IF you received labwork today, you will receive an invoice from [place]. Please contact [facility] at [phone] with questions or concerns regarding your invoice.   Our billing staff will not be able to assist you with questions regarding bills from these companies.  You will be contacted with the lab results as soon as they are available. The fastest way to get your results is to activate your My Chart account. Instructions are located on the last page of this paperwork. If you have not heard from [place] regarding the results in 2 weeks, please contact this office.        If you  have lab work done today you will be contacted with your lab results within the next 2 weeks.  If you have not heard from [place] then please contact us. The fastest way to get your results is to register for My Chart.   IF you received an x-ray today, you will receive an invoice from [facility]. Please contact [facility] at [phone] with questions or concerns regarding your invoice.   IF you received labwork today, you will receive an invoice from [place]. Please contact [facility] at [phone] with questions or concerns regarding your invoice.   Our billing staff will not be able to assist you with questions regarding bills from these companies.  You will be contacted with the lab results as soon as they are available. The fastest way to get your results is to activate your My Chart account. Instructions are located on the last page of this paperwork. If you have not heard from [place] regarding the results in 2 weeks, please contact  this office.

## 2019-11-18 ENCOUNTER — Other Ambulatory Visit: Payer: Self-pay | Admitting: Family Medicine

## 2019-11-18 DIAGNOSIS — N401 Enlarged prostate with lower urinary tract symptoms: Secondary | ICD-10-CM

## 2019-11-18 DIAGNOSIS — R3912 Poor urinary stream: Secondary | ICD-10-CM

## 2019-12-09 ENCOUNTER — Ambulatory Visit: Payer: Federal, State, Local not specified - PPO | Admitting: Family Medicine

## 2019-12-09 ENCOUNTER — Other Ambulatory Visit: Payer: Self-pay

## 2019-12-09 ENCOUNTER — Encounter: Payer: Self-pay | Admitting: Family Medicine

## 2019-12-09 VITALS — BP 116/73 | HR 50 | Temp 97.7°F | Ht 71.0 in | Wt 259.0 lb

## 2019-12-09 DIAGNOSIS — I10 Essential (primary) hypertension: Secondary | ICD-10-CM

## 2019-12-09 DIAGNOSIS — R739 Hyperglycemia, unspecified: Secondary | ICD-10-CM | POA: Diagnosis not present

## 2019-12-09 DIAGNOSIS — N401 Enlarged prostate with lower urinary tract symptoms: Secondary | ICD-10-CM | POA: Diagnosis not present

## 2019-12-09 DIAGNOSIS — E038 Other specified hypothyroidism: Secondary | ICD-10-CM

## 2019-12-09 DIAGNOSIS — Z7185 Encounter for immunization safety counseling: Secondary | ICD-10-CM

## 2019-12-09 DIAGNOSIS — Z7189 Other specified counseling: Secondary | ICD-10-CM

## 2019-12-09 DIAGNOSIS — Z6836 Body mass index (BMI) 36.0-36.9, adult: Secondary | ICD-10-CM

## 2019-12-09 DIAGNOSIS — F418 Other specified anxiety disorders: Secondary | ICD-10-CM

## 2019-12-09 DIAGNOSIS — E063 Autoimmune thyroiditis: Secondary | ICD-10-CM | POA: Diagnosis not present

## 2019-12-09 DIAGNOSIS — R3912 Poor urinary stream: Secondary | ICD-10-CM

## 2019-12-09 MED ORDER — LOSARTAN POTASSIUM-HCTZ 100-12.5 MG PO TABS: 1.0000 | ORAL_TABLET | Freq: Every day | ORAL | 1 refills | Status: DC

## 2019-12-09 MED ORDER — LEVOTHYROXINE SODIUM 100 MCG PO TABS
100.0000 ug | ORAL_TABLET | Freq: Every day | ORAL | 3 refills | Status: DC
Start: 2019-12-09 — End: 2020-06-08

## 2019-12-09 MED ORDER — SERTRALINE HCL 50 MG PO TABS: 50.0000 mg | ORAL_TABLET | Freq: Every day | ORAL | 1 refills | Status: DC

## 2019-12-09 MED ORDER — TAMSULOSIN HCL 0.4 MG PO CAPS: 0.4000 mg | ORAL_CAPSULE | Freq: Every day | ORAL | 1 refills | Status: DC

## 2019-12-09 NOTE — Progress Notes (Signed)
Subjective:  Patient ID: Fred Davis, male    DOB: 01/05/1956  Age: 64 y.o. MRN: [id number]  CC:  Chief Complaint  Patient presents with   Follow-up    on Benign prostatic hyperplasia. Pt reports since starting the FLomax pt hasn't had any issues, but notices when his misses a dose of flomax.pt reports no inssues with his anxiety or depression since last OV.    HPI Fred Millarddward Spegal presents for   BPH: History of benign prostatic hyperplasia with weak urinary stream.  Was improving on Flomax 0.4 mg daily at his May 12 visit.  PSA normal in January. flomax has been working well. Nocturia - not usual on meds. 1 time at the most. Emptying bladder easier on flomax.   Hypertension: Stable when discussed at May visit.  Continued on losartan hydrochlorothiazide 100/12.5 mg daily. No new side effects.  Home readings: 106/63.  No lightheadedness/dizziness. Only if moving head really fast.  No high readings.  BP Readings from Last 3 Encounters:  12/09/19 116/73  08/26/19 129/79  05/22/19 134/80   Lab Results  Component Value Date   CREATININE 0.99 04/30/2019    Hyperglycemia/obesity: Body mass index is 36.12 kg/m. Did not like eating breakfast in morning. Still one meal primarily at night, snack in afternoon, and 100 calorie pepsi. Eats candy during day as well.  Glucose 102 on labs in January.  Weight loss, exercise discussed.  He is down 6 pounds from May visit. [employer] work is exercise.  Was able to decrease to 228 prior to daughter's wedding.  Wt Readings from Last 3 Encounters:  12/09/19 259 lb (117.5 kg)  08/26/19 265 lb 9.6 oz (120.5 kg)  05/22/19 261 lb (118.4 kg)    Hypothyroidism: Lab Results  Component Value Date   TSH 1.66 04/01/2019  Followed by endocrinology, Dr. Lonzo CloudShamleffer.  Secondary to Hashimoto's thyroiditis.  Next appointment December 15.  Synthroid 100 mcg daily. Option to follow here.  Taking medication daily. No new side effects.  No new hot or cold  intolerance. No new hair or skin changes, heart palpitations or new fatigue. No new weight changes.   Has not had covid vaccine.   Anxiety/depression discussed at May visit.  Stable on Zoloft at that time. Still working well.   [facility] 08/26/2019 05/22/2019 04/30/2019 09/01/2018 02/05/2018  Decreased Interest 0 0 0 0 0  Down, Depressed, Hopeless 0 0 0 0 0  PHQ - 2 Score 0 0 0 0 0  Altered sleeping - - - - -  Tired, decreased energy - - - - -  Change in appetite - - - - -  Feeling bad or failure about yourself  - - - - -  Trouble concentrating - - - - -  Moving slowly or fidgety/restless - - - - -  Suicidal thoughts - - - - -  PHQ-9 Score - - - - -  Difficult doing work/chores - - - - -     History Patient Active Problem List   Diagnosis Date Noted   ICH (intracerebral hemorrhage) (HCC) - right acute ICH at right external capsule 11/14/2016   Left ankle swelling 12/18/2014   Left ankle pain 12/18/2014   Past Medical History:  Diagnosis Date   Hypertension    Thyroid disease    Past Surgical History:  Procedure Laterality Date   HERNIA REPAIR     Allergies  Allergen Reactions   Lisinopril Cough   Prior to Admission medications  Medication Sig Start Date End Date Taking? Authorizing Provider  levothyroxine (SYNTHROID) 100 MCG tablet Take 1 tablet (100 mcg total) by mouth daily. 04/03/19  Yes Shamleffer, Konrad Dolores, MD  losartan-hydrochlorothiazide (HYZAAR) 100-12.5 MG tablet Take 1 tablet by mouth daily. 08/26/19  Yes Shade Flood, MD  sertraline (ZOLOFT) 50 MG tablet Take 1 tablet (50 mg total) by mouth daily. 08/26/19  Yes Shade Flood, MD  tamsulosin (FLOMAX) 0.4 MG CAPS capsule TAKE 1 CAPSULE BY MOUTH EVERY DAY 11/18/19  Yes Shade Flood, MD  tetrahydrozoline 0.05 % ophthalmic solution Place 2 drops into both eyes as needed.   Yes [provider]  triamcinolone cream (KENALOG) 0.1 % Apply 1 application topically 2 (two)  times daily. 08/26/19  Yes Shade Flood, MD  aspirin EC 81 MG tablet Take 81 mg by mouth daily. Patient not taking: Reported on 12/09/2019    [provider]  diphenhydramine-acetaminophen (TYLENOL PM) 25-500 MG TABS Take 1 tablet by mouth at bedtime as needed. Patient not taking: Reported on 12/09/2019    [provider]  temazepam (RESTORIL) 30 MG capsule Take by mouth at bedtime as needed.  Patient not taking: Reported on 12/09/2019 12/11/16   [provider]   Social History   Socioeconomic History   Marital status: Divorced    Spouse name: Not on file   Number of children: Not on file   Years of education: Not on file   Highest education level: Not on file  Occupational History   Not on file  Tobacco Use   Smoking status: Former Smoker    Types: Cigarettes    Quit date: 11/14/2016    Years since quitting: 3.0   Smokeless tobacco: Never Used  Substance and Sexual Activity   Alcohol use: Yes    Alcohol/week: 3.0 standard drinks    Types: 3 Cans of beer per week   Drug use: No   Sexual activity: Not on file  Other Topics Concern   Not on file  Social History Narrative   Not on file   Social Determinants of Health   Financial Resource Strain:    Difficulty of Paying Living Expenses: Not on file  Food Insecurity:    Worried About Running Out of Food in the Last Year: Not on file   Ran Out of Food in the Last Year: Not on file  Transportation Needs:    Lack of Transportation (Medical): Not on file   Lack of Transportation (Non-Medical): Not on file  Physical Activity:    Days of Exercise per Week: Not on file   Minutes of Exercise per Session: Not on file  Stress:    Feeling of Stress : Not on file  Social Connections:    Frequency of Communication with Friends and Family: Not on file   Frequency of Social Gatherings with Friends and Family: Not on file   Attends Religious Services: Not on file   Active Member of  Clubs or Organizations: Not on file   Attends Banker Meetings: Not on file   Marital Status: Not on file  Intimate Partner Violence:    Fear of Current or Ex-Partner: Not on file   Emotionally Abused: Not on file   Physically Abused: Not on file   Sexually Abused: Not on file    Review of Systems  Constitutional: Negative for fatigue and unexpected weight change.  Eyes: Negative for visual disturbance.  Respiratory: Negative for cough, chest tightness and shortness  of breath.   Cardiovascular: Negative for chest pain, palpitations and leg swelling.  Gastrointestinal: Negative for abdominal pain and blood in stool.  Neurological: Negative for dizziness, light-headedness and headaches.     Objective:   Vitals:   12/09/19 1332  BP: 116/73  Pulse: (!) 50  Temp: 97.7 F (36.5 C)  TempSrc: Temporal  SpO2: 95%  Weight: 259 lb (117.5 kg)  Height: 5\' 11"  (1.803 m)     Physical Exam Vitals reviewed.  Constitutional:      Appearance: He is well-developed.  HENT:     Head: Normocephalic and atraumatic.  Eyes:     Pupils: Pupils are equal, round, and reactive to light.  Neck:     Vascular: No carotid bruit or JVD.  Cardiovascular:     Rate and Rhythm: Normal rate and regular rhythm.     Heart sounds: Normal heart sounds. No murmur heard.   Pulmonary:     Effort: Pulmonary effort is normal.     Breath sounds: Normal breath sounds. No rales.  Skin:    General: Skin is warm and dry.  Neurological:     Mental Status: He is alert and oriented to person, place, and time.  Psychiatric:        Mood and Affect: Mood normal.        Behavior: Behavior normal.        Assessment & Plan:  Kenton Fortin is a 64 y.o. male . Essential hypertension - Plan: Comprehensive metabolic panel, losartan-hydrochlorothiazide (HYZAAR) 100-12.5 MG tablet  -  Stable, tolerating current regimen. Medications refilled. Labs pending as above.  If symptoms of low blood pressure  or low readings at home, could decrease meds.  With history of CVA, current readings okay.  Hyperglycemia - Plan: Hemoglobin A1c, Comprehensive metabolic panel  Benign prostatic hyperplasia with weak urinary stream - Plan: tamsulosin (FLOMAX) 0.4 MG CAPS capsule  -Stable, continue Flomax same dose.  Hypothyroidism due to Hashimoto's thyroiditis - Plan: TSH  -Check TSH, no med changes for now.  BMI 36.0-36.9,adult  -Dietary changes discussed including trying to avoid large late night meal, caution with candies, sugars, sugar-containing beverages.  Depression with anxiety - Plan: sertraline (ZOLOFT) 50 MG tablet  -stable. Continue zoloft same dose.   Vaccine counseling  -covid vaccine recommended, link to CDC given.   Meds ordered this encounter  Medications   losartan-hydrochlorothiazide (HYZAAR) 100-12.5 MG tablet    Sig: Take 1 tablet by mouth daily.    Dispense:  90 tablet    Refill:  1   tamsulosin (FLOMAX) 0.4 MG CAPS capsule    Sig: Take 1 capsule (0.4 mg total) by mouth daily.    Dispense:  90 capsule    Refill:  1   levothyroxine (SYNTHROID) 100 MCG tablet    Sig: Take 1 tablet (100 mcg total) by mouth daily.    Dispense:  90 tablet    Refill:  3   sertraline (ZOLOFT) 50 MG tablet    Sig: Take 1 tablet (50 mg total) by mouth daily.    Dispense:  90 tablet    Refill:  1   Patient Instructions    No change in meds for now. If any low readings at home, lightheaded, fatigue - then we may need to decrease your blood pressure meds. Follow up if that occurs.   I recommend trying to spread out calories during the day, try to avoid large late night meal,  and try to avoid sugar containing beverages.  Water or flavored water is best. Low intensity exercise like walking with a minimum of 150 minutes per week spread out over most days.  Goal of BMI under 35 initially, then we can work toward getting down toward a BMI of 30 eventually. Remember small changes can make a big  difference. Let me know if you have questions.   No other med changes at this time.   I do recommend COVID-19 vaccine as soon as possible.  Based on your health history, you are at increased risk of complications from that infection.  Here is a link to information from the CDC on the Covid vaccine.  Please let me know if you have any questions on anything you have read on that site or other further questions regarding that vaccine. [email or web address]   If you have lab work done today you will be contacted with your lab results within the next 2 weeks.  If you have not heard from [place] then please contact us. The fastest way to get your results is to register for My Chart.   IF you received an x-ray today, you will receive an invoice from [facility]. Please contact [facility] at [phone] with questions or concerns regarding your invoice.   IF you received labwork today, you will receive an invoice from [place]. Please contact [facility] at [phone] with questions or concerns regarding your invoice.   Our billing staff will not be able to assist you with questions regarding bills from these companies.  You will be contacted with the lab results as soon as they are available. The fastest way to get your results is to activate your My Chart account. Instructions are located on the last page of this paperwork. If you have not heard from [place] regarding the results in 2 weeks, please contact this office.          Signed, Meredith Staggers, MD [facility]

## 2019-12-09 NOTE — Patient Instructions (Addendum)
  No change in meds for now. If any low readings at home, lightheaded, fatigue - then we may need to decrease your blood pressure meds. Follow up if that occurs.   I recommend trying to spread out calories during the day, try to avoid large late night meal,  and try to avoid sugar containing beverages.  Water or flavored water is best. Low intensity exercise like walking with a minimum of 150 minutes per week spread out over most days.  Goal of BMI under 35 initially, then we can work toward getting down toward a BMI of 30 eventually. Remember small changes can make a big difference. Let me know if you have questions.   No other med changes at this time.   I do recommend COVID-19 vaccine as soon as possible.  Based on your health history, you are at increased risk of complications from that infection.  Here is a link to information from the CDC on the Covid vaccine.  Please let me know if you have any questions on anything you have read on that site or other further questions regarding that vaccine. [email or web address]   If you have lab work done today you will be contacted with your lab results within the next 2 weeks.  If you have not heard from [place] then please contact us. The fastest way to get your results is to register for My Chart.   IF you received an x-ray today, you will receive an invoice from [facility]. Please contact [facility] at [phone] with questions or concerns regarding your invoice.   IF you received labwork today, you will receive an invoice from [place]. Please contact [facility] at [phone] with questions or concerns regarding your invoice.   Our billing staff will not be able to assist you with questions regarding bills from these companies.  You will be contacted with the lab results as soon as they are available. The fastest way to get your results is to activate your My Chart account. Instructions are  located on the last page of this paperwork. If you have not heard from [place] regarding the results in 2 weeks, please contact this office.

## 2019-12-10 LAB — COMPREHENSIVE METABOLIC PANEL
ALT: 13 IU/L (ref 0–44)
AST: 15 IU/L (ref 0–40)
Albumin/Globulin Ratio: 1.4 (ref 1.2–2.2)
Albumin: 4.4 g/dL (ref 3.8–4.8)
Alkaline Phosphatase: 85 IU/L (ref 48–121)
BUN/Creatinine Ratio: 13 (ref 10–24)
BUN: 13 mg/dL (ref 8–27)
Bilirubin Total: 0.4 mg/dL (ref 0.0–1.2)
CO2: 21 mmol/L (ref 20–29)
Calcium: 9.5 mg/dL (ref 8.6–10.2)
Chloride: 103 mmol/L (ref 96–106)
Creatinine, Ser: 1.02 mg/dL (ref 0.76–1.27)
GFR calc Af Amer: 90 mL/min/{1.73_m2} (ref >59)
GFR calc non Af Amer: 78 mL/min/{1.73_m2} (ref >59)
Globulin, Total: 3.2 g/dL (ref 1.5–4.5)
Glucose: 85 mg/dL (ref 65–99)
Potassium: 4.4 mmol/L (ref 3.5–5.2)
Sodium: 137 mmol/L (ref 134–144)
Total Protein: 7.6 g/dL (ref 6.0–8.5)

## 2019-12-10 LAB — HEMOGLOBIN A1C
Est. average glucose Bld gHb Est-mCnc: 117 mg/dL
Hgb A1c MFr Bld: 5.7 % — ABNORMAL HIGH (ref 4.8–5.6)

## 2019-12-10 LAB — TSH: TSH: 1.05 u[IU]/mL (ref 0.450–4.500)

## 2019-12-15 ENCOUNTER — Encounter: Payer: Self-pay | Admitting: Radiology

## 2020-03-30 ENCOUNTER — Ambulatory Visit: Payer: Federal, State, Local not specified - PPO | Admitting: Internal Medicine

## 2020-03-30 NOTE — Progress Notes (Deleted)
Name: Fred Davis  MRN/ DOB: [id number], Oct 15, 1955    Age/ Sex: 64 y.o., male     PCP: Shade Flood, MD   Reason for Endocrinology Evaluation: Hypothyroidism     Initial Endocrinology Clinic Visit: 01/30/18    PATIENT IDENTIFIER: [name] is a 64 y.o., male with a past medical history of HTN and CVA (No motor deficit just numbness/2018) . He has followed with [facility] since 01/30/18 for consultative assistance with management of his hypothyroidism.   HISTORICAL SUMMARY: The patient was first diagnosed with hypothyroidism on 11/15/16. LT-4 replacement was prescribed at the time but the patient did not start taking it until January, 2019 with a repeat TSH of 30.0 uIU/mL at the time.   On his initial visit to [place], he was scheduled to be on Levothyroxine 50 mcg daily but had ran out for ~ 69 weeks with a TSH of 26.33 uIU/mL and was started on Levothyroxine 100 mcg daily   His Anti-TPO Ab's were elevated at 710 IU/mL   SUBJECTIVE:    Today (03/30/2020):  [name] is here for a follow up on his hypothyroidism.  He is compliant with his LT-4 replacement.   He   Energy level is fair.   Denies constipation or diarrhea.  Denies any local neck symptoms , such as pain or swelling.  Denies any depression or anxiety .        HISTORY:  Past Medical History:  Past Medical History:  Diagnosis Date  . Hypertension   . Thyroid disease    Past Surgical History:  Past Surgical History:  Procedure Laterality Date  . HERNIA REPAIR      Social History:  reports that he quit smoking about 3 years ago. His smoking use included cigarettes. He has never used smokeless tobacco. He reports current alcohol use of about 3.0 standard drinks of alcohol per week. He reports that he does not use drugs.  Family History: family history includes Multiple sclerosis in his mother; Stroke in his father.   HOME MEDICATIONS: Levothyroxine 100 mcg daily      OBJECTIVE:   PHYSICAL EXAM: VS: There were no vitals taken for this visit.   EXAM: General: Pt appears well and is in NAD  Ears, Nose, Throat: Hearing: Grossly intact bilaterally Dental: Good dentition  Throat: Clear without mass, erythema or exudate  Neck: General: Supple without adenopathy. Thyroid: Thyroid size normal.  No goiter or nodules appreciated. No thyroid bruit.  Lungs: Clear with good BS bilat with no rales, rhonchi, or wheezes  Heart: Auscultation: RRR.  Abdomen: Normoactive bowel sounds, soft, nontender, without masses or organomegaly palpable  Extremities:  L LE: No pretibial edema normal ROM and strength.  Mental Status: Judgment, insight: Intact Orientation: Oriented to time, place, and person Mood and affect: No depression, anxiety, or agitation     DATA REVIEWED:  Results for Fred Davis, Fred Davis (MRN [id number]) as of 04/03/2019 07:52  Ref. Range 04/01/2019 15:51  TSH Latest Ref Range: 0.35 - 4.50 uIU/mL 1.66  T4,Free(Direct) Latest Ref Range: 0.60 - 1.60 ng/dL 2.11     ASSESSMENT / PLAN / RECOMMENDATIONS:   1. Hypothyroidism Secondary to Hashimoto's Thyroiditis :  Plan:  Pt is clinically and bio-chemically euthyoid  He is compliant with his LT-4 intake  Pt educated extensively on the correct way to take levothyroxine (first thing in the morning with water, 30 minutes before eating or taking other medications).  Pt encouraged to double dose the following day  if she were to miss a dose given long half-life of levothyroxine     Medications  Levothyroxine 100 mcg daily    F/U in 6 months    Signed electronically by: Lyndle Herrlich, MD  [facility]  [facility] [address] [place], [place] [address] Phone: [phone] FAX: [phone]      CC: Shade Flood, MD [address] [place] [address] Phone: [phone]  Fax: [phone]   Return to Endocrinology clinic as  below: Future Appointments  Date Time Provider Department Center  03/30/2020  2:40 PM Breanda Greenlaw, Konrad Dolores, MD [facility] None  06/08/2020  1:20 PM Shade Flood, MD [facility]

## 2020-06-08 ENCOUNTER — Other Ambulatory Visit: Payer: Self-pay | Admitting: Family Medicine

## 2020-06-08 ENCOUNTER — Ambulatory Visit: Payer: Federal, State, Local not specified - PPO | Admitting: Family Medicine

## 2020-06-08 ENCOUNTER — Other Ambulatory Visit: Payer: Self-pay

## 2020-06-08 ENCOUNTER — Encounter: Payer: Self-pay | Admitting: Family Medicine

## 2020-06-08 VITALS — BP 118/80 | HR 72 | Temp 98.7°F | Ht 71.0 in | Wt 258.2 lb

## 2020-06-08 DIAGNOSIS — E038 Other specified hypothyroidism: Secondary | ICD-10-CM | POA: Diagnosis not present

## 2020-06-08 DIAGNOSIS — I10 Essential (primary) hypertension: Secondary | ICD-10-CM

## 2020-06-08 DIAGNOSIS — Z13220 Encounter for screening for lipoid disorders: Secondary | ICD-10-CM | POA: Diagnosis not present

## 2020-06-08 DIAGNOSIS — R739 Hyperglycemia, unspecified: Secondary | ICD-10-CM | POA: Diagnosis not present

## 2020-06-08 DIAGNOSIS — E063 Autoimmune thyroiditis: Secondary | ICD-10-CM

## 2020-06-08 DIAGNOSIS — R3912 Poor urinary stream: Secondary | ICD-10-CM

## 2020-06-08 DIAGNOSIS — N401 Enlarged prostate with lower urinary tract symptoms: Secondary | ICD-10-CM

## 2020-06-08 DIAGNOSIS — F418 Other specified anxiety disorders: Secondary | ICD-10-CM | POA: Diagnosis not present

## 2020-06-08 DIAGNOSIS — M7711 Lateral epicondylitis, right elbow: Secondary | ICD-10-CM

## 2020-06-08 MED ORDER — LEVOTHYROXINE SODIUM 100 MCG PO TABS: 100.0000 ug | ORAL_TABLET | Freq: Every day | ORAL | 3 refills | Status: DC

## 2020-06-08 MED ORDER — SERTRALINE HCL 50 MG PO TABS: 50.0000 mg | ORAL_TABLET | Freq: Every day | ORAL | 1 refills | Status: DC

## 2020-06-08 MED ORDER — TAMSULOSIN HCL 0.4 MG PO CAPS: 0.4000 mg | ORAL_CAPSULE | Freq: Every day | ORAL | 1 refills | Status: DC

## 2020-06-08 MED ORDER — LOSARTAN POTASSIUM-HCTZ 100-12.5 MG PO TABS: 1.0000 | ORAL_TABLET | Freq: Every day | ORAL | 1 refills | Status: DC

## 2020-06-08 NOTE — Telephone Encounter (Signed)
Pharmacy is requesting new Rx- medication backordered/not available. Request separate Rx for medications

## 2020-06-08 NOTE — Patient Instructions (Addendum)
I would consider increasing the zoloft to $RemoveB'100mg'WgOynVOY$  per day as that may help with anxiety/depression. You can take 2 of the $Remo'50mg'DCbFu$  pills, and if you feel that is working better - let me know and I can send in the $Remo'100mg'PmLty$  dose.  Walking or other low intensity activity most days per week can help with stress and weight.  See info on foods and diet with prediabetes - that may help weight as well.   Avoid repetitive use of R elbow - especially with palm down carrying. See info below, use brace with activity - recheck in 6 weeks if not improving.   Return to the clinic or go to the nearest emergency room if any of your symptoms worsen or new symptoms occur.   Tennis Elbow  Tennis elbow (lateral epicondylitis) is inflammation of tendons in your outer forearm, near your elbow. Tendons are tissues that connect muscle to bone. When you have tennis elbow, inflammation affects the tendons that you use to bend your wrist and move your hand up. Inflammation occurs in the lower part of the upper arm bone (humerus), where the tendons connect to the bone (lateral epicondyle). Tennis elbow often affects people who play tennis, but anyone may get the condition from repeatedly extending the wrist or turning the forearm. What are the causes? This condition is usually caused by repeatedly extending the wrist, turning the forearm, and using the hands. It can result from sports or work that requires repetitive forearm movements. In some cases, it may be caused by a sudden injury. What increases the risk? You are more likely to develop tennis elbow if you play tennis or another racket sport. You also have a higher risk if you frequently use your hands for work. Besides people who play tennis, others at greater risk include:  People who use computers.  Architect workers.  People who work in [employer].  Musicians.  Cooks.  Cashiers. What are the signs or symptoms? Symptoms of this condition include:  Pain and  tenderness in the forearm and the outer part of the elbow. Pain may be felt only when using the arm, or it may be there all the time.  A burning feeling that starts in the elbow and spreads down the forearm.  A weak grip in the hand. How is this diagnosed? This condition is diagnosed based on your symptoms, your medical history, and a physical exam. You may also have X-rays or an MRI to:  Confirm the diagnosis.  Look for other issues.  Check for tears in the ligaments, muscles, or tendons. How is this treated? Resting and icing your arm is often the first treatment. Your health care provider may also recommend:  Medicines to reduce pain and inflammation. These may be in the form of a pill, topical gels, or shots of a steroid medicine (cortisone).  An elbow strap to reduce stress on the area.  Physical therapy. This may include massage or exercises or both.  An elbow brace to restrict the movements that cause symptoms. If these treatments do not help relieve your symptoms, your health care provider may recommend surgery to remove damaged muscle and reattach healthy muscle to bone. Follow these instructions at home: If you have a brace or strap:  Wear the brace or strap as told by your health care provider. Remove it only as told by your health care provider.  Check the skin around the brace or strap every day. Tell your health care provider about any concerns.  Loosen the brace if your fingers tingle, become numb, or turn cold and blue.  Keep the brace clean.  If the brace or strap is not waterproof: ? Do not let it get wet. ? Cover it with a watertight covering when you take a bath or a shower. Managing pain, stiffness, and swelling  If directed, put ice on the injured area. To do this: ? If you have a removable brace or strap, remove it as told by your health care provider. ? Put ice in a plastic bag. ? Place a towel between your skin and the bag. ? Leave the ice on for  20 minutes, 2-3 times a day. ? Remove the ice if your skin turns bright red. This is very important. If you cannot feel pain, heat, or cold, you have a greater risk of damage to the area.  Move your fingers often to reduce stiffness and swelling.   Activity  Rest your elbow and wrist and avoid activities that cause symptoms as told by your health care provider.  Do physical therapy exercises as told by your health care provider.  If you lift an object, lift it with your palm facing up. This reduces stress on your elbow. Lifestyle  If your tennis elbow is caused by sports, check your equipment and make sure that: ? You use it correctly. ? It is good match for you.  If your tennis elbow is caused by work or computer use, take frequent breaks to stretch your arm. Talk with your employer about ways to manage your condition at work. General instructions  Take over-the-counter and prescription medicines only as told by your health care provider.  Do not use any products that contain nicotine or tobacco. These products include cigarettes, chewing tobacco, and vaping devices, such as e-cigarettes. If you need help quitting, ask your health care provider.  Keep all follow-up visits. This is important. How is this prevented?  Before and after activity: ? Warm up and stretch before being active. ? Cool down and stretch after being active. ? Give your body time to rest between periods of activity.  During activity: ? Make sure to use equipment that fits you. ? If you play tennis, put power in your stroke with your lower body. Avoid using your arm only.  Maintain physical fitness, including: ? Strength. ? Flexibility. ? Endurance.  Do exercises to strengthen the forearm muscles. Contact a health care provider if:  You have pain that gets worse or does not get better with treatment.  You have numbness or weakness in your forearm, hand, or fingers. Get help right away if:  Your pain  is severe.  You cannot move your wrist. Summary  Tennis elbow (lateral epicondylitis) is inflammation of tendons in your outer forearm, near your elbow.  Common symptoms include pain and tenderness in your forearm and the outer part of your elbow.  This condition is usually caused by repeatedly extending your wrist, turning your forearm, and using your hands.  The first treatment is often resting and icing your arm to relieve symptoms. Further treatment may include taking medicine, getting physical therapy, wearing a brace or strap, or having surgery. This information is not intended to replace advice given to you by your health care provider. Make sure you discuss any questions you have with your health care provider. Document Revised: 10/13/2019 Document Reviewed: 10/13/2019 Elsevier Patient Education  2021 Elsevier Inc.     Managing Stress, Adult Feeling a certain amount of stress  is normal. Stress helps our body and mind get ready to deal with the demands of life. Stress hormones can motivate you to do well at work and meet your responsibilities. However severe or long-lasting (chronic) stress can affect your mental and physical health. Chronic stress puts you at higher risk for anxiety, depression, and other health problems like digestive problems, muscle aches, heart disease, high blood pressure, and stroke. What are the causes? Common causes of stress include:  Demands from work, such as deadlines, feeling overworked, or having long hours.  Pressures at home, such as money issues, disagreements with a spouse, or parenting issues.  Pressures from major life changes, such as divorce, moving, loss of a loved one, or chronic illness. You may be at higher risk for stress-related problems if you do not get enough sleep, are in poor health, do not have emotional support, or have a mental health disorder like anxiety or depression. How to recognize stress Stress can make you:  Have  trouble sleeping.  Feel sad, anxious, irritable, or overwhelmed.  Lose your appetite.  Overeat or want to eat unhealthy foods.  Want to use drugs or alcohol. Stress can also cause physical symptoms, such as:  Sore, tense muscles, especially in the shoulders and neck.  Headaches.  Trouble breathing.  A faster heart rate.  Stomach pain, nausea, or vomiting.  Diarrhea or constipation.  Trouble concentrating. Follow these instructions at home: Lifestyle  Identify the source of your stress and your reaction to it. See a therapist who can help you change your reactions.  When there are stressful events: ? Talk about it with family, friends, or co-workers. ? Try to think realistically about stressful events and not ignore them or overreact. ? Try to find the positives in a stressful situation and not focus on the negatives. ? Cut back on responsibilities at work and home, if possible. Ask for help from friends or family members if you need it.  Find ways to cope with stress, such as: ? Meditation. ? Deep breathing. ? Yoga or tai chi. ? Progressive muscle relaxation. ? Doing art, playing music, or reading. ? Making time for fun activities. ? Spending time with family and friends.  Get support from family, friends, or spiritual resources. Eating and drinking  Eat a healthy diet. This includes: ? Eating foods that are high in fiber, such as beans, whole grains, and fresh fruits and vegetables. ? Limiting foods that are high in fat and processed sugars, such as fried and sweet foods.  Do not skip meals or overeat.  Drink enough fluid to keep your urine pale yellow. Alcohol use  Do not drink alcohol if: ? Your health care provider tells you not to drink. ? You are pregnant, may be pregnant, or are planning to become pregnant.  Drinking alcohol is a way some people try to ease their stress. This can be dangerous, so if you drink alcohol: ? Limit how much you use  to:  0-1 drink a day for women.  0-2 drinks a day for men. ? Be aware of how much alcohol is in your drink. In the [place] one drink equals one 12 oz bottle of beer (355 mL), one 5 oz glass of wine (148 mL), or one 1 oz glass of hard liquor (44 mL). Activity  Include 30 minutes of exercise in your daily schedule. Exercise is a good stress reducer.  Include time in your day for an activity that you find relaxing. Try taking  a walk, going on a bike ride, reading a book, or listening to music.  Schedule your time in a way that lowers stress, and keep a consistent schedule. Prioritize what is most important to get done.   General instructions  Get enough sleep. Try to go to sleep and get up at about the same time every day.  Take over-the-counter and prescription medicines only as told by your health care provider.  Do not use any products that contain nicotine or tobacco, such as cigarettes, e-cigarettes, and chewing tobacco. If you need help quitting, ask your health care provider.  Do not use drugs or smoke to cope with stress.  Keep all follow-up visits as told by your health care provider. This is important. Where to find support  Talk with your health care provider about stress management or finding a support group.  Find a therapist to work with you on your stress management techniques. Contact a health care provider if:  Your stress symptoms get worse.  You are unable to manage your stress at home.  You are struggling to stop using drugs or alcohol. Get help right away if:  You may be a danger to yourself or others.  You have any thoughts of death or suicide. If you ever feel like you may hurt yourself or others, or have thoughts about taking your own life, get help right away. You can go to your nearest emergency department or call:  Your local emergency services (911 in the [place]  A suicide crisis helpline, such as the National Suicide Prevention Lifeline at  6673599089. This is open 24 hours a day. Summary  Feeling a certain amount of stress is normal, but severe or long-lasting (chronic) stress can affect your mental and physical health.  Chronic stress can put you at higher risk for anxiety, depression, and other health problems like digestive problems, muscle aches, heart disease, high blood pressure, and stroke.  You may be at higher risk for stress-related problems if you do not get enough sleep, are in poor health, lack emotional support, or have a mental health disorder like anxiety or depression.  Identify the source of your stress and your reaction to it. Try talking about stressful events with family, friends, or co-workers, finding a coping method, or getting support from spiritual resources.  If you need more help, talk with your health care provider about finding a support group or a mental health therapist. This information is not intended to replace advice given to you by your health care provider. Make sure you discuss any questions you have with your health care provider. Document Revised: 10/29/2018 Document Reviewed: 10/29/2018 Elsevier Patient Education  2021 Elsevier Inc.   Prediabetes Eating Plan Prediabetes is a condition that causes blood sugar (glucose) levels to be higher than normal. This increases the risk for developing type 2 diabetes (type 2 diabetes mellitus). Working with a health care provider or nutrition specialist (dietitian) to make diet and lifestyle changes can help prevent the onset of diabetes. These changes may help you:  Control your blood glucose levels.  Improve your cholesterol levels.  Manage your blood pressure. What are tips for following this plan? Reading food labels  Read food labels to check the amount of fat, salt (sodium), and sugar in prepackaged foods. Avoid foods that have: ? Saturated fats. ? Trans fats. ? Added sugars.  Avoid foods that have more than 300 milligrams (mg)  of sodium per serving. Limit your sodium intake to less than  2,300 mg each day. Shopping  Avoid buying pre-made and processed foods.  Avoid buying drinks with added sugar. Cooking  Cook with olive oil. Do not use butter, lard, or ghee.  Bake, broil, grill, steam, or boil foods. Avoid frying. Meal planning  Work with your dietitian to create an eating plan that is right for you. This may include tracking how many calories you take in each day. Use a food diary, notebook, or mobile application to track what you eat at each meal.  Consider following a Mediterranean diet. This includes: ? Eating several servings of fresh fruits and vegetables each day. ? Eating fish at least twice a week. ? Eating one serving each day of whole grains, beans, nuts, and seeds. ? Using olive oil instead of other fats. ? Limiting alcohol. ? Limiting red meat. ? Using nonfat or low-fat dairy products.  Consider following a plant-based diet. This includes dietary choices that focus on eating mostly vegetables and fruit, grains, beans, nuts, and seeds.  If you have high blood pressure, you may need to limit your sodium intake or follow a diet such as the DASH (Dietary Approaches to Stop Hypertension) eating plan. The DASH diet aims to lower high blood pressure.   Lifestyle  Set weight loss goals with help from your health care team. It is recommended that most people with prediabetes lose 7% of their body weight.  Exercise for at least 30 minutes 5 or more days a week.  Attend a support group or seek support from a mental health counselor.  Take over-the-counter and prescription medicines only as told by your health care provider. What foods are recommended? Fruits Berries. Bananas. Apples. Oranges. Grapes. Papaya. Mango. Pomegranate. Kiwi. Grapefruit. Cherries. Vegetables Lettuce. Spinach. Peas. Beets. Cauliflower. Cabbage. Broccoli. Carrots. Tomatoes. Squash. Eggplant. Herbs. Peppers. Onions.  Cucumbers. Brussels sprouts. Grains Whole grains, such as whole-wheat or whole-grain breads, crackers, cereals, and pasta. Unsweetened oatmeal. Bulgur. Barley. Quinoa. Brown rice. Corn or whole-wheat flour tortillas or taco shells. Meats and other proteins Seafood. Poultry without skin. Lean cuts of pork and beef. Tofu. Eggs. Nuts. Beans. Dairy Low-fat or fat-free dairy products, such as yogurt, cottage cheese, and cheese. Beverages Water. Tea. Coffee. Sugar-free or diet soda. Seltzer water. Low-fat or nonfat milk. Milk alternatives, such as soy or almond milk. Fats and oils Olive oil. Canola oil. Sunflower oil. Grapeseed oil. Avocado. Walnuts. Sweets and desserts Sugar-free or low-fat pudding. Sugar-free or low-fat ice cream and other frozen treats. Seasonings and condiments Herbs. Sodium-free spices. Mustard. Relish. Low-salt, low-sugar ketchup. Low-salt, low-sugar barbecue sauce. Low-fat or fat-free mayonnaise. The items listed above may not be a complete list of recommended foods and beverages. Contact a dietitian for more information. What foods are not recommended? Fruits Fruits canned with syrup. Vegetables Canned vegetables. Frozen vegetables with butter or cream sauce. Grains Refined white flour and flour products, such as bread, pasta, snack foods, and cereals. Meats and other proteins Fatty cuts of meat. Poultry with skin. Breaded or fried meat. Processed meats. Dairy Full-fat yogurt, cheese, or milk. Beverages Sweetened drinks, such as iced tea and soda. Fats and oils Butter. Lard. Ghee. Sweets and desserts Baked goods, such as cake, cupcakes, pastries, cookies, and cheesecake. Seasonings and condiments Spice mixes with added salt. Ketchup. Barbecue sauce. Mayonnaise. The items listed above may not be a complete list of foods and beverages that are not recommended. Contact a dietitian for more information. Where to find more information  American Diabetes  Association: www.diabetes.org Summary  You may need to make diet and lifestyle changes to help prevent the onset of diabetes. These changes can help you control blood sugar, improve cholesterol levels, and manage blood pressure.  Set weight loss goals with help from your health care team. It is recommended that most people with prediabetes lose 7% of their body weight.  Consider following a Mediterranean diet. This includes eating plenty of fresh fruits and vegetables, whole grains, beans, nuts, seeds, fish, and low-fat dairy, and using olive oil instead of other fats. This information is not intended to replace advice given to you by your health care provider. Make sure you discuss any questions you have with your health care provider. Document Revised: 07/02/2019 Document Reviewed: 07/02/2019 Elsevier Patient Education  [place].    If you have lab work done today you will be contacted with your lab results within the next 2 weeks.  If you have not heard from [place] then please contact us. The fastest way to get your results is to register for My Chart.   IF you received an x-ray today, you will receive an invoice from [facility]. Please contact [facility] at [phone] with questions or concerns regarding your invoice.   IF you received labwork today, you will receive an invoice from [facility]. Please contact [facility] at [phone] with questions or concerns regarding your invoice.   Our billing staff will not be able to assist you with questions regarding bills from these companies.  You will be contacted with the lab results as soon as they are available. The fastest way to get your results is to activate your My Chart account. Instructions are located on the last page of this paperwork. If you have not heard from [place] regarding the results in 2 weeks, please contact this office.

## 2020-06-08 NOTE — Progress Notes (Signed)
Subjective:  Patient ID: Fred Davis, male    DOB: [birth year]  Age: 65 y.o. MRN: [id number]  CC:  Chief Complaint  Patient presents with  . Medical Management of Chronic Issues    6 m f/u   . right elbow pain     Going on since October possible tennis elbow. Pains that has comes and goes     HPI Fred Davis presents for   R elbow pain: Noticed after trying to pull lawnmower repetitively in October. No pain or injury that day, sore the next day - pain off and on since then.  Has not tried brace but researched tennis elbow splint.   Hypertension: With hx of R intracerebral hemorrhage in 2018.  Home readings: 126/76.  Losartan hctz 100/12.5mg  QD.  Takes tamsulosin as well - working well for urination.  BP Readings from Last 3 Encounters:  06/08/20 118/80  12/09/19 116/73  08/26/19 129/79   Lab Results  Component Value Date   CREATININE 1.02 12/09/2019   Prediabetes: Weight improved last visit. Has researched carbs and has list No regular exercise, walks in yard, parks further away at times.   Lab Results  Component Value Date   HGBA1C 5.7 (H) 12/09/2019   Wt Readings from Last 3 Encounters:  06/08/20 258 lb 3.2 oz (117.1 kg)  12/09/19 259 lb (117.5 kg)  08/26/19 265 lb 9.6 oz (120.5 kg)   Hypothyroidism: Lab Results  Component Value Date   TSH 1.050 12/09/2019  prior followed by endocrine.  Taking medication daily. Synthroid 135mcg qd.  No new hot or cold intolerance. No new hair or skin changes, heart palpitations or new fatigue. No new weight changes.    Depression: Zoloft 50mg  qd. Depression with anxiety.  Family stressors - ongoing. Feeling stressed. Mom's health as stressor.  Refuses to meet with therapist - does not want to have other appointments.  Refuses to change meds at this time.  No change in alcohol - 6 per week.    Depression screen [facility] 2/9 06/08/2020 08/26/2019 05/22/2019 04/30/2019 09/01/2018  Decreased Interest 0 0 0 0 0  Down,  Depressed, Hopeless 0 0 0 0 0  PHQ - 2 Score 0 0 0 0 0  Altered sleeping - - - - -  Tired, decreased energy - - - - -  Change in appetite - - - - -  Feeling bad or failure about yourself  - - - - -  Trouble concentrating - - - - -  Moving slowly or fidgety/restless - - - - -  Suicidal thoughts - - - - -  PHQ-9 Score - - - - -  Difficult doing work/chores - - - - -   GAD 7 : Generalized Anxiety Score 12/09/2019  Nervous, Anxious, on Edge 0  Control/stop worrying 0  Worry too much - different things 1  Trouble relaxing 0  Restless 0  Easily annoyed or irritable 0  Afraid - awful might happen 1  Total GAD 7 Score 2       History Patient Active Problem List   Diagnosis Date Noted  . ICH (intracerebral hemorrhage) (HCC) - right acute ICH at right external capsule 11/14/2016  . Left ankle swelling 12/18/2014  . Left ankle pain 12/18/2014   Past Medical History:  Diagnosis Date  . Hypertension   . Thyroid disease    Past Surgical History:  Procedure Laterality Date  . HERNIA REPAIR     Allergies  Allergen Reactions  . Lisinopril  Cough   Prior to Admission medications   Medication Sig Start Date End Date Taking? Authorizing Provider  levothyroxine (SYNTHROID) 100 MCG tablet Take 1 tablet (100 mcg total) by mouth daily. 12/09/19  Yes [name], MD  losartan-hydrochlorothiazide (HYZAAR) 100-12.5 MG tablet Take 1 tablet by mouth daily. 12/09/19  Yes [name], MD  sertraline (ZOLOFT) 50 MG tablet Take 1 tablet (50 mg total) by mouth daily. 12/09/19  Yes [name], MD  tamsulosin (FLOMAX) 0.4 MG CAPS capsule Take 1 capsule (0.4 mg total) by mouth daily. 12/09/19  Yes [name], MD  tetrahydrozoline 0.05 % ophthalmic solution Place 2 drops into both eyes as needed. Patient not taking: Reported on 06/08/2020    [provider]  triamcinolone cream (KENALOG) 0.1 % Apply 1 application topically 2 (two) times daily. Patient not taking: Reported  on 06/08/2020 08/26/19   [name], MD   Social History   Socioeconomic History  . Marital status: Divorced    Spouse name: Not on file  . Number of children: Not on file  . Years of education: Not on file  . Highest education level: Not on file  Occupational History  . Not on file  Tobacco Use  . Smoking status: Former Smoker    Types: Cigarettes    Quit date: 11/14/2016    Years since quitting: 3.5  . Smokeless tobacco: Never Used  Substance and Sexual Activity  . Alcohol use: Yes    Alcohol/week: 3.0 standard drinks    Types: 3 Cans of beer per week  . Drug use: No  . Sexual activity: Not on file  Other Topics Concern  . Not on file  Social History Narrative  . Not on file   Social Determinants of Health   Financial Resource Strain: Not on file  Food Insecurity: Not on file  Transportation Needs: Not on file  Physical Activity: Not on file  Stress: Not on file  Social Connections: Not on file  Intimate Partner Violence: Not on file    Review of Systems  Constitutional: Negative for fatigue and unexpected weight change.  Eyes: Negative for visual disturbance.  Respiratory: Negative for cough, chest tightness and shortness of breath.   Cardiovascular: Negative for chest pain, palpitations and leg swelling.  Gastrointestinal: Negative for abdominal pain and blood in stool.  Neurological: Negative for dizziness, light-headedness and headaches.     Objective:   Vitals:   06/08/20 1321  BP: 118/80  Pulse: 72  Temp: 98.7 F (37.1 C)  TempSrc: Temporal  SpO2: 93%  Weight: 258 lb 3.2 oz (117.1 kg)  Height: $Remove'5\' 11"'ZAzEOLO$  (1.803 m)     Physical Exam Vitals reviewed.  Constitutional:      Appearance: He is well-developed and well-nourished.  HENT:     Head: Normocephalic and atraumatic.  Eyes:     Extraocular Movements: EOM normal.     Pupils: Pupils are equal, round, and reactive to light.  Neck:     Vascular: No carotid bruit or JVD.  Cardiovascular:      Rate and Rhythm: Normal rate and regular rhythm.     Heart sounds: Normal heart sounds. No murmur heard.   Pulmonary:     Effort: Pulmonary effort is normal.     Breath sounds: Normal breath sounds. No rales.  Musculoskeletal:        General: No edema.     Comments: Right elbow full range of motion, tender to palpation over lateral  epicondyle as well as reproduction of pain with resisted wrist extension.  No other bony tenderness.  No apparent soft tissue swelling, skin intact, no ecchymosis.   Skin:    General: Skin is warm and dry.  Neurological:     Mental Status: He is alert and oriented to person, place, and time.  Psychiatric:        Attention and Perception: Attention normal.        Mood and Affect: Mood is anxious.        Speech: Speech normal.        Behavior: Behavior normal.     Comments: Somewhat agitated/anxious mood.  Improved during visit.    35 minutes spent during visit, greater than 50% counseling and assimilation of information, chart review, and discussion of plan.    Assessment & Plan:  Fred Davis is a 65 y.o. male . Essential hypertension - Plan: losartan-hydrochlorothiazide (HYZAAR) 100-12.5 MG tablet  -Overall stable, continue same regimen, check labs  Depression with anxiety - Plan: sertraline (ZOLOFT) 50 MG tablet  -Still with persistent family stressors,  persistent depression/anxiety with stress.  We discussed management options including changing Zoloft to a higher dose, counseling which was declined, handout given on stress management.  Declined medication changes today.  Option to increase Zoloft to 100 mg daily with his current dosing of 50 mg 2/day, and to advise me if he makes that change.  Hypothyroidism due to Hashimoto's thyroiditis - Plan: TSH  -Continue same dose Synthroid, check TSH  Hyperglycemia - Plan: Comprehensive metabolic panel, Hemoglobin A1c  -Check A1c, handout given on prediabetes and to help with food choices for weight  loss.  Low intensity exercise discussed for weight loss as well as for stress management.  Screening for hyperlipidemia - Plan: Comprehensive metabolic panel, Lipid panel  Right lateral epicondylitis - Plan: Apply other splint  -Counterforce brace given with instructions discussed, activity modification, handout given, RTC precautions  Benign prostatic hyperplasia with weak urinary stream - Plan: tamsulosin (FLOMAX) 0.4 MG CAPS capsule  -Stable, continue Flomax.  Meds ordered this encounter  Medications  . losartan-hydrochlorothiazide (HYZAAR) 100-12.5 MG tablet    Sig: Take 1 tablet by mouth daily.    Dispense:  90 tablet    Refill:  1  . levothyroxine (SYNTHROID) 100 MCG tablet    Sig: Take 1 tablet (100 mcg total) by mouth daily.    Dispense:  90 tablet    Refill:  3  . sertraline (ZOLOFT) 50 MG tablet    Sig: Take 1 tablet (50 mg total) by mouth daily.    Dispense:  90 tablet    Refill:  1  . tamsulosin (FLOMAX) 0.4 MG CAPS capsule    Sig: Take 1 capsule (0.4 mg total) by mouth daily.    Dispense:  90 capsule    Refill:  1   Patient Instructions   I would consider increasing the zoloft to $RemoveB'100mg'vzPWbreo$  per day as that may help with anxiety/depression. You can take 2 of the $Remo'50mg'IPJFp$  pills, and if you feel that is working better - let me know and I can send in the $Remo'100mg'TYfCW$  dose.  Walking or other low intensity activity most days per week can help with stress and weight.  See info on foods and diet with prediabetes - that may help weight as well.   Avoid repetitive use of R elbow - especially with palm down carrying. See info below, use brace with activity - recheck in 6 weeks if not  improving.   Return to the clinic or go to the nearest emergency room if any of your symptoms worsen or new symptoms occur.   Tennis Elbow  Tennis elbow (lateral epicondylitis) is inflammation of tendons in your outer forearm, near your elbow. Tendons are tissues that connect muscle to bone. When you have  tennis elbow, inflammation affects the tendons that you use to bend your wrist and move your hand up. Inflammation occurs in the lower part of the upper arm bone (humerus), where the tendons connect to the bone (lateral epicondyle). Tennis elbow often affects people who play tennis, but anyone may get the condition from repeatedly extending the wrist or turning the forearm. What are the causes? This condition is usually caused by repeatedly extending the wrist, turning the forearm, and using the hands. It can result from sports or work that requires repetitive forearm movements. In some cases, it may be caused by a sudden injury. What increases the risk? You are more likely to develop tennis elbow if you play tennis or another racket sport. You also have a higher risk if you frequently use your hands for work. Besides people who play tennis, others at greater risk include:  People who use computers.  Holiday representative workers.  People who work in [employer].  Musicians.  Cooks.  Cashiers. What are the signs or symptoms? Symptoms of this condition include:  Pain and tenderness in the forearm and the outer part of the elbow. Pain may be felt only when using the arm, or it may be there all the time.  A burning feeling that starts in the elbow and spreads down the forearm.  A weak grip in the hand. How is this diagnosed? This condition is diagnosed based on your symptoms, your medical history, and a physical exam. You may also have X-rays or an MRI to:  Confirm the diagnosis.  Look for other issues.  Check for tears in the ligaments, muscles, or tendons. How is this treated? Resting and icing your arm is often the first treatment. Your health care provider may also recommend:  Medicines to reduce pain and inflammation. These may be in the form of a pill, topical gels, or shots of a steroid medicine (cortisone).  An elbow strap to reduce stress on the area.  Physical therapy. This may  include massage or exercises or both.  An elbow brace to restrict the movements that cause symptoms. If these treatments do not help relieve your symptoms, your health care provider may recommend surgery to remove damaged muscle and reattach healthy muscle to bone. Follow these instructions at home: If you have a brace or strap:  Wear the brace or strap as told by your health care provider. Remove it only as told by your health care provider.  Check the skin around the brace or strap every day. Tell your health care provider about any concerns.  Loosen the brace if your fingers tingle, become numb, or turn cold and blue.  Keep the brace clean.  If the brace or strap is not waterproof: ? Do not let it get wet. ? Cover it with a watertight covering when you take a bath or a shower. Managing pain, stiffness, and swelling  If directed, put ice on the injured area. To do this: ? If you have a removable brace or strap, remove it as told by your health care provider. ? Put ice in a plastic bag. ? Place a towel between your skin and the bag. ? Leave  the ice on for 20 minutes, 2-3 times a day. ? Remove the ice if your skin turns bright red. This is very important. If you cannot feel pain, heat, or cold, you have a greater risk of damage to the area.  Move your fingers often to reduce stiffness and swelling.   Activity  Rest your elbow and wrist and avoid activities that cause symptoms as told by your health care provider.  Do physical therapy exercises as told by your health care provider.  If you lift an object, lift it with your palm facing up. This reduces stress on your elbow. Lifestyle  If your tennis elbow is caused by sports, check your equipment and make sure that: ? You use it correctly. ? It is good match for you.  If your tennis elbow is caused by work or computer use, take frequent breaks to stretch your arm. Talk with your employer about ways to manage your condition at  work. General instructions  Take over-the-counter and prescription medicines only as told by your health care provider.  Do not use any products that contain nicotine or tobacco. These products include cigarettes, chewing tobacco, and vaping devices, such as e-cigarettes. If you need help quitting, ask your health care provider.  Keep all follow-up visits. This is important. How is this prevented?  Before and after activity: ? Warm up and stretch before being active. ? Cool down and stretch after being active. ? Give your body time to rest between periods of activity.  During activity: ? Make sure to use equipment that fits you. ? If you play tennis, put power in your stroke with your lower body. Avoid using your arm only.  Maintain physical fitness, including: ? Strength. ? Flexibility. ? Endurance.  Do exercises to strengthen the forearm muscles. Contact a health care provider if:  You have pain that gets worse or does not get better with treatment.  You have numbness or weakness in your forearm, hand, or fingers. Get help right away if:  Your pain is severe.  You cannot move your wrist. Summary  Tennis elbow (lateral epicondylitis) is inflammation of tendons in your outer forearm, near your elbow.  Common symptoms include pain and tenderness in your forearm and the outer part of your elbow.  This condition is usually caused by repeatedly extending your wrist, turning your forearm, and using your hands.  The first treatment is often resting and icing your arm to relieve symptoms. Further treatment may include taking medicine, getting physical therapy, wearing a brace or strap, or having surgery. This information is not intended to replace advice given to you by your health care provider. Make sure you discuss any questions you have with your health care provider. Document Revised: 10/13/2019 Document Reviewed: 10/13/2019 Elsevier Patient Education  2021 [place], Adult Feeling a certain amount of stress is normal. Stress helps our body and mind get ready to deal with the demands of life. Stress hormones can motivate you to do well at work and meet your responsibilities. However severe or long-lasting (chronic) stress can affect your mental and physical health. Chronic stress puts you at higher risk for anxiety, depression, and other health problems like digestive problems, muscle aches, heart disease, high blood pressure, and stroke. What are the causes? Common causes of stress include:  Demands from work, such as deadlines, feeling overworked, or having long hours.  Pressures at home, such as money issues, disagreements with a spouse,  or parenting issues.  Pressures from major life changes, such as divorce, moving, loss of a loved one, or chronic illness. You may be at higher risk for stress-related problems if you do not get enough sleep, are in poor health, do not have emotional support, or have a mental health disorder like anxiety or depression. How to recognize stress Stress can make you:  Have trouble sleeping.  Feel sad, anxious, irritable, or overwhelmed.  Lose your appetite.  Overeat or want to eat unhealthy foods.  Want to use drugs or alcohol. Stress can also cause physical symptoms, such as:  Sore, tense muscles, especially in the shoulders and neck.  Headaches.  Trouble breathing.  A faster heart rate.  Stomach pain, nausea, or vomiting.  Diarrhea or constipation.  Trouble concentrating. Follow these instructions at home: Lifestyle  Identify the source of your stress and your reaction to it. See a therapist who can help you change your reactions.  When there are stressful events: ? Talk about it with family, friends, or co-workers. ? Try to think realistically about stressful events and not ignore them or overreact. ? Try to find the positives in a stressful situation and not focus on  the negatives. ? Cut back on responsibilities at work and home, if possible. Ask for help from friends or family members if you need it.  Find ways to cope with stress, such as: ? Meditation. ? Deep breathing. ? Yoga or tai chi. ? Progressive muscle relaxation. ? Doing art, playing music, or reading. ? Making time for fun activities. ? Spending time with family and friends.  Get support from family, friends, or spiritual resources. Eating and drinking  Eat a healthy diet. This includes: ? Eating foods that are high in fiber, such as beans, whole grains, and fresh fruits and vegetables. ? Limiting foods that are high in fat and processed sugars, such as fried and sweet foods.  Do not skip meals or overeat.  Drink enough fluid to keep your urine pale yellow. Alcohol use  Do not drink alcohol if: ? Your health care provider tells you not to drink. ? You are pregnant, may be pregnant, or are planning to become pregnant.  Drinking alcohol is a way some people try to ease their stress. This can be dangerous, so if you drink alcohol: ? Limit how much you use to:  0-1 drink a day for women.  0-2 drinks a day for men. ? Be aware of how much alcohol is in your drink. In the [place] one drink equals one 12 oz bottle of beer (355 mL), one 5 oz glass of wine (148 mL), or one 1 oz glass of hard liquor (44 mL). Activity  Include 30 minutes of exercise in your daily schedule. Exercise is a good stress reducer.  Include time in your day for an activity that you find relaxing. Try taking a walk, going on a bike ride, reading a book, or listening to music.  Schedule your time in a way that lowers stress, and keep a consistent schedule. Prioritize what is most important to get done.   General instructions  Get enough sleep. Try to go to sleep and get up at about the same time every day.  Take over-the-counter and prescription medicines only as told by your health care provider.  Do not use  any products that contain nicotine or tobacco, such as cigarettes, e-cigarettes, and chewing tobacco. If you need help quitting, ask your health care provider.  Do  not use drugs or smoke to cope with stress.  Keep all follow-up visits as told by your health care provider. This is important. Where to find support  Talk with your health care provider about stress management or finding a support group.  Find a therapist to work with you on your stress management techniques. Contact a health care provider if:  Your stress symptoms get worse.  You are unable to manage your stress at home.  You are struggling to stop using drugs or alcohol. Get help right away if:  You may be a danger to yourself or others.  You have any thoughts of death or suicide. If you ever feel like you may hurt yourself or others, or have thoughts about taking your own life, get help right away. You can go to your nearest emergency department or call:  Your local emergency services (911 in the [place]  A suicide crisis helpline, such as the National Suicide Prevention Lifeline at (408)019-6528. This is open 24 hours a day. Summary  Feeling a certain amount of stress is normal, but severe or long-lasting (chronic) stress can affect your mental and physical health.  Chronic stress can put you at higher risk for anxiety, depression, and other health problems like digestive problems, muscle aches, heart disease, high blood pressure, and stroke.  You may be at higher risk for stress-related problems if you do not get enough sleep, are in poor health, lack emotional support, or have a mental health disorder like anxiety or depression.  Identify the source of your stress and your reaction to it. Try talking about stressful events with family, friends, or co-workers, finding a coping method, or getting support from spiritual resources.  If you need more help, talk with your health care provider about finding a support  group or a mental health therapist. This information is not intended to replace advice given to you by your health care provider. Make sure you discuss any questions you have with your health care provider. Document Revised: 10/29/2018 Document Reviewed: 10/29/2018 Elsevier Patient Education  2021 Elsevier Inc.   Prediabetes Eating Plan Prediabetes is a condition that causes blood sugar (glucose) levels to be higher than normal. This increases the risk for developing type 2 diabetes (type 2 diabetes mellitus). Working with a health care provider or nutrition specialist (dietitian) to make diet and lifestyle changes can help prevent the onset of diabetes. These changes may help you:  Control your blood glucose levels.  Improve your cholesterol levels.  Manage your blood pressure. What are tips for following this plan? Reading food labels  Read food labels to check the amount of fat, salt (sodium), and sugar in prepackaged foods. Avoid foods that have: ? Saturated fats. ? Trans fats. ? Added sugars.  Avoid foods that have more than 300 milligrams (mg) of sodium per serving. Limit your sodium intake to less than 2,300 mg each day. Shopping  Avoid buying pre-made and processed foods.  Avoid buying drinks with added sugar. Cooking  Cook with olive oil. Do not use butter, lard, or ghee.  Bake, broil, grill, steam, or boil foods. Avoid frying. Meal planning  Work with your dietitian to create an eating plan that is right for you. This may include tracking how many calories you take in each day. Use a food diary, notebook, or mobile application to track what you eat at each meal.  Consider following a Mediterranean diet. This includes: ? Eating several servings of fresh fruits and vegetables each  day. ? Eating fish at least twice a week. ? Eating one serving each day of whole grains, beans, nuts, and seeds. ? Using olive oil instead of other fats. ? Limiting alcohol. ? Limiting  red meat. ? Using nonfat or low-fat dairy products.  Consider following a plant-based diet. This includes dietary choices that focus on eating mostly vegetables and fruit, grains, beans, nuts, and seeds.  If you have high blood pressure, you may need to limit your sodium intake or follow a diet such as the DASH (Dietary Approaches to Stop Hypertension) eating plan. The DASH diet aims to lower high blood pressure.   Lifestyle  Set weight loss goals with help from your health care team. It is recommended that most people with prediabetes lose 7% of their body weight.  Exercise for at least 30 minutes 5 or more days a week.  Attend a support group or seek support from a mental health counselor.  Take over-the-counter and prescription medicines only as told by your health care provider. What foods are recommended? Fruits Berries. Bananas. Apples. Oranges. Grapes. Papaya. Mango. Pomegranate. Kiwi. Grapefruit. Cherries. Vegetables Lettuce. Spinach. Peas. Beets. Cauliflower. Cabbage. Broccoli. Carrots. Tomatoes. Squash. Eggplant. Herbs. Peppers. Onions. Cucumbers. Brussels sprouts. Grains Whole grains, such as whole-wheat or whole-grain breads, crackers, cereals, and pasta. Unsweetened oatmeal. Bulgur. Barley. Quinoa. Brown rice. Corn or whole-wheat flour tortillas or taco shells. Meats and other proteins Seafood. Poultry without skin. Lean cuts of pork and beef. Tofu. Eggs. Nuts. Beans. Dairy Low-fat or fat-free dairy products, such as yogurt, cottage cheese, and cheese. Beverages Water. Tea. Coffee. Sugar-free or diet soda. Seltzer water. Low-fat or nonfat milk. Milk alternatives, such as soy or almond milk. Fats and oils Olive oil. Canola oil. Sunflower oil. Grapeseed oil. Avocado. Walnuts. Sweets and desserts Sugar-free or low-fat pudding. Sugar-free or low-fat ice cream and other frozen treats. Seasonings and condiments Herbs. Sodium-free spices. Mustard. Relish. Low-salt, low-sugar  ketchup. Low-salt, low-sugar barbecue sauce. Low-fat or fat-free mayonnaise. The items listed above may not be a complete list of recommended foods and beverages. Contact a dietitian for more information. What foods are not recommended? Fruits Fruits canned with syrup. Vegetables Canned vegetables. Frozen vegetables with butter or cream sauce. Grains Refined white flour and flour products, such as bread, pasta, snack foods, and cereals. Meats and other proteins Fatty cuts of meat. Poultry with skin. Breaded or fried meat. Processed meats. Dairy Full-fat yogurt, cheese, or milk. Beverages Sweetened drinks, such as iced tea and soda. Fats and oils Butter. Lard. Ghee. Sweets and desserts Baked goods, such as cake, cupcakes, pastries, cookies, and cheesecake. Seasonings and condiments Spice mixes with added salt. Ketchup. Barbecue sauce. Mayonnaise. The items listed above may not be a complete list of foods and beverages that are not recommended. Contact a dietitian for more information. Where to find more information  American Diabetes Association: www.diabetes.org Summary  You may need to make diet and lifestyle changes to help prevent the onset of diabetes. These changes can help you control blood sugar, improve cholesterol levels, and manage blood pressure.  Set weight loss goals with help from your health care team. It is recommended that most people with prediabetes lose 7% of their body weight.  Consider following a Mediterranean diet. This includes eating plenty of fresh fruits and vegetables, whole grains, beans, nuts, seeds, fish, and low-fat dairy, and using olive oil instead of other fats. This information is not intended to replace advice given to you by your health care provider.  Make sure you discuss any questions you have with your health care provider. Document Revised: 07/02/2019 Document Reviewed: 07/02/2019 Elsevier Patient Education  [employer].    If  you have lab work done today you will be contacted with your lab results within the next 2 weeks.  If you have not heard from [place] then please contact us. The fastest way to get your results is to register for My Chart.   IF you received an x-ray today, you will receive an invoice from [facility]. Please contact [facility] at [phone] with questions or concerns regarding your invoice.   IF you received labwork today, you will receive an invoice from [facility]. Please contact [facility] at [phone] with questions or concerns regarding your invoice.   Our billing staff will not be able to assist you with questions regarding bills from these companies.  You will be contacted with the lab results as soon as they are available. The fastest way to get your results is to activate your My Chart account. Instructions are located on the last page of this paperwork. If you have not heard from [place] regarding the results in 2 weeks, please contact this office.          Signed, Merri Ray, MD [facility]

## 2020-06-09 LAB — COMPREHENSIVE METABOLIC PANEL
ALT: 10 IU/L (ref 0–44)
AST: 18 IU/L (ref 0–40)
Albumin/Globulin Ratio: 1.4 (ref 1.2–2.2)
Albumin: 4.2 g/dL (ref 3.8–4.8)
Alkaline Phosphatase: 88 IU/L (ref 44–121)
BUN/Creatinine Ratio: 13 (ref 10–24)
BUN: 13 mg/dL (ref 8–27)
Bilirubin Total: 0.4 mg/dL (ref 0.0–1.2)
CO2: 21 mmol/L (ref 20–29)
Calcium: 9.3 mg/dL (ref 8.6–10.2)
Chloride: 101 mmol/L (ref 96–106)
Creatinine, Ser: 1.01 mg/dL (ref 0.76–1.27)
GFR calc Af Amer: 90 mL/min/{1.73_m2} (ref >59)
GFR calc non Af Amer: 78 mL/min/{1.73_m2} (ref >59)
Globulin, Total: 3.1 g/dL (ref 1.5–4.5)
Glucose: 86 mg/dL (ref 65–99)
Potassium: 4.3 mmol/L (ref 3.5–5.2)
Sodium: 139 mmol/L (ref 134–144)
Total Protein: 7.3 g/dL (ref 6.0–8.5)

## 2020-06-09 LAB — LIPID PANEL
Chol/HDL Ratio: 3.6 ratio (ref 0.0–5.0)
Cholesterol, Total: 171 mg/dL (ref 100–199)
HDL: 48 mg/dL (ref >39)
LDL Chol Calc (NIH): 107 mg/dL — ABNORMAL HIGH (ref 0–99)
Triglycerides: 86 mg/dL (ref 0–149)
VLDL Cholesterol Cal: 16 mg/dL (ref 5–40)

## 2020-06-09 LAB — TSH: TSH: 1.73 u[IU]/mL (ref 0.450–4.500)

## 2020-06-09 LAB — HEMOGLOBIN A1C
Est. average glucose Bld gHb Est-mCnc: 111 mg/dL
Hgb A1c MFr Bld: 5.5 % (ref 4.8–5.6)

## 2020-09-10 ENCOUNTER — Other Ambulatory Visit: Payer: Self-pay | Admitting: Family Medicine

## 2020-09-10 DIAGNOSIS — I10 Essential (primary) hypertension: Secondary | ICD-10-CM

## 2020-11-05 ENCOUNTER — Other Ambulatory Visit: Payer: Self-pay | Admitting: Family Medicine

## 2020-11-05 DIAGNOSIS — F418 Other specified anxiety disorders: Secondary | ICD-10-CM

## 2020-12-07 ENCOUNTER — Ambulatory Visit: Payer: Federal, State, Local not specified - PPO | Admitting: Family Medicine

## 2020-12-07 ENCOUNTER — Other Ambulatory Visit: Payer: Self-pay

## 2020-12-07 VITALS — BP 124/78 | HR 52 | Temp 98.1°F | Resp 17 | Ht 71.0 in | Wt 260.2 lb

## 2020-12-07 DIAGNOSIS — R3912 Poor urinary stream: Secondary | ICD-10-CM

## 2020-12-07 DIAGNOSIS — F418 Other specified anxiety disorders: Secondary | ICD-10-CM | POA: Diagnosis not present

## 2020-12-07 DIAGNOSIS — E063 Autoimmune thyroiditis: Secondary | ICD-10-CM | POA: Diagnosis not present

## 2020-12-07 DIAGNOSIS — R739 Hyperglycemia, unspecified: Secondary | ICD-10-CM

## 2020-12-07 DIAGNOSIS — N401 Enlarged prostate with lower urinary tract symptoms: Secondary | ICD-10-CM | POA: Diagnosis not present

## 2020-12-07 DIAGNOSIS — E038 Other specified hypothyroidism: Secondary | ICD-10-CM | POA: Diagnosis not present

## 2020-12-07 DIAGNOSIS — I10 Essential (primary) hypertension: Secondary | ICD-10-CM | POA: Diagnosis not present

## 2020-12-07 DIAGNOSIS — Z13220 Encounter for screening for lipoid disorders: Secondary | ICD-10-CM | POA: Diagnosis not present

## 2020-12-07 MED ORDER — LOSARTAN POTASSIUM 100 MG PO TABS: 100.0000 mg | ORAL_TABLET | Freq: Every day | ORAL | 1 refills | Status: DC

## 2020-12-07 MED ORDER — SERTRALINE HCL 50 MG PO TABS: 50.0000 mg | ORAL_TABLET | Freq: Every day | ORAL | 1 refills | Status: DC

## 2020-12-07 MED ORDER — TAMSULOSIN HCL 0.4 MG PO CAPS: 0.4000 mg | ORAL_CAPSULE | Freq: Every day | ORAL | 1 refills | Status: DC

## 2020-12-07 NOTE — Patient Instructions (Addendum)
No change in meds for now.   Try taking flomax daily, but if symptoms not better at daily dosing, let me know and I can refer to urology.   Thanks for coming in today and let me know if there are questions.   Hypothyroidism  Hypothyroidism is when the thyroid gland does not make enough of certain hormones (it is underactive). The thyroid gland is a small gland located in the lower front part of the neck, just in front of the windpipe (trachea). This gland makes hormones that help control how the body uses food for energy (metabolism) as well as how the heart and brain function. These hormones also play a role in keeping your bones strong. When the thyroid is underactive, it produces too little of the hormones thyroxine (T4) and triiodothyronine (T3). What are the causes? This condition may be caused by: Hashimoto's disease. This is a disease in which the body's disease-fighting system (immune system) attacks the thyroid gland. This is the most common cause. Viral infections. Pregnancy. Certain medicines. Birth defects. Past radiation treatments to the head or neck for cancer. Past treatment with radioactive iodine. Past exposure to radiation in the environment. Past surgical removal of part or all of the thyroid. Problems with a gland in the center of the brain (pituitary gland). Lack of enough iodine in the diet. What increases the risk? You are more likely to develop this condition if: You are male. You have a family history of thyroid conditions. You use a medicine called lithium. You take medicines that affect the immune system (immunosuppressants). What are the signs or symptoms? Symptoms of this condition include: Feeling as though you have no energy (lethargy). Not being able to tolerate cold. Weight gain that is not explained by a change in diet or exercise habits. Lack of appetite. Dry skin. Coarse hair. Menstrual irregularity. Slowing of thought  processes. Constipation. Sadness or depression. How is this diagnosed? This condition may be diagnosed based on: Your symptoms, your medical history, and a physical exam. Blood tests. You may also have imaging tests, such as an ultrasound or MRI. How is this treated? This condition is treated with medicine that replaces the thyroid hormones that your body does not make. After you begin treatment, it may take several weeksfor symptoms to go away. Follow these instructions at home: Take over-the-counter and prescription medicines only as told by your health care provider. If you start taking any new medicines, tell your health care provider. Keep all follow-up visits as told by your health care provider. This is important. As your condition improves, your dosage of thyroid hormone medicine may change. You will need to have blood tests regularly so that your health care provider can monitor your condition. Contact a health care provider if: Your symptoms do not get better with treatment. You are taking thyroid hormone replacement medicine and you: Sweat a lot. Have tremors. Feel anxious. Lose weight rapidly. Cannot tolerate heat. Have emotional swings. Have diarrhea. Feel weak. Get help right away if you have: Chest pain. An irregular heartbeat. A rapid heartbeat. Difficulty breathing. Summary Hypothyroidism is when the thyroid gland does not make enough of certain hormones (it is underactive). When the thyroid is underactive, it produces too little of the hormones thyroxine (T4) and triiodothyronine (T3). The most common cause is Hashimoto's disease, a disease in which the body's disease-fighting system (immune system) attacks the thyroid gland. The condition can also be caused by viral infections, medicine, pregnancy, or past radiation treatment to  the head or neck. Symptoms may include weight gain, dry skin, constipation, feeling as though you do not have energy, and not being  able to tolerate cold. This condition is treated with medicine to replace the thyroid hormones that your body does not make. This information is not intended to replace advice given to you by your health care provider. Make sure you discuss any questions you have with your healthcare provider. Document Revised: 01/01/2020 Document Reviewed: 12/17/2019 Elsevier Patient Education  2022 [employer].

## 2020-12-07 NOTE — Progress Notes (Signed)
Subjective:  Patient ID: Fred Davis, male    DOB: 05-Jul-1955  Age: 65 y.o. MRN: [id number]  CC:  Chief Complaint  Patient presents with   Hyperglycemia    Pt reports no concerns no physical sxs.    Hyperlipidemia    Pt due for recheck, no concerns has worked on diet    Dysuria    Last time pt was started on flomax for weak urinary stream, notes some urgency at times but is doing okay would like to discuss possible increase     HPI Fred Davis presents for   Hypothyroidism: Lab Results  Component Value Date   TSH 1.730 06/08/2020  Taking medication daily.  Synthroid 100 mcg daily.  No new hot or cold intolerance. No new hair or skin changes, heart palpitations or new fatigue. No new weight changes. Cut back on candy and Little Debbies.  Wt Readings from Last 3 Encounters:  12/07/20 260 lb 3.2 oz (118 kg)  06/08/20 258 lb 3.2 oz (117.1 kg)  12/09/19 259 lb (117.5 kg)   Lab Results  Component Value Date   HGBA1C 5.5 06/08/2020    Depression: Depression with anxiety, some family stressors discussed last visit.  Was continued on Zoloft 50 mg daily, refused change in medications or meeting with therapist at that time.  We did discuss option of Zoloft 100 mg daily. Stayed on same dose of meds - feels like able to handle stress better.   Depression screen [facility] 2/9 12/07/2020 06/08/2020 08/26/2019 05/22/2019 04/30/2019  Decreased Interest 0 0 0 0 0  Down, Depressed, Hopeless 0 0 0 0 0  PHQ - 2 Score 0 0 0 0 0  Altered sleeping 0 - - - -  Tired, decreased energy 1 - - - -  Change in appetite - - - - -  Feeling bad or failure about yourself  0 - - - -  Trouble concentrating 0 - - - -  Moving slowly or fidgety/restless 0 - - - -  Suicidal thoughts 0 - - - -  PHQ-9 Score 1 - - - -  Difficult doing work/chores - - - - -   Benign prostatic hypertrophy with lower urinary tract symptoms, weak urinary stream Treated with tamsulosin 0.4 mg daily.  Feels like may be able to work  better, but forgets about one dose per week.  No retention.  No hematuria. No dysuria.   Lab Results  Component Value Date   PSA1 0.5 04/30/2019   PSA 0.46 07/13/2014    Hyperlipidemia: Mild elevation previously in February, no current meds Lab Results  Component Value Date   CHOL 171 06/08/2020   HDL 48 06/08/2020   LDLCALC 107 (H) 06/08/2020   TRIG 86 06/08/2020   CHOLHDL 3.6 06/08/2020   Lab Results  Component Value Date   ALT 10 06/08/2020   AST 18 06/08/2020   ALKPHOS 88 06/08/2020   BILITOT 0.4 06/08/2020    Hypertension: Losartan 100 mg daily. No new side effects.  Home readings: 145/77 last week (prior to meds), lower after meds.   BP Readings from Last 3 Encounters:  12/07/20 124/78  06/08/20 118/80  12/09/19 116/73   Lab Results  Component Value Date   CREATININE 1.01 06/08/2020      History Patient Active Problem List   Diagnosis Date Noted   ICH (intracerebral hemorrhage) (HCC) - right acute ICH at right external capsule 11/14/2016   Left ankle swelling 12/18/2014   Left ankle pain 12/18/2014  Past Medical History:  Diagnosis Date   Hypertension    Thyroid disease    Past Surgical History:  Procedure Laterality Date   HERNIA REPAIR     Allergies  Allergen Reactions   Lisinopril Cough   Prior to Admission medications   Medication Sig Start Date End Date Taking? Authorizing Provider  levothyroxine (SYNTHROID) 100 MCG tablet Take 1 tablet (100 mcg total) by mouth daily. 06/08/20  Yes Shade FloodGreene, Marcellus Pulliam R, MD  losartan (COZAAR) 100 MG tablet TAKE 1 TABLET BY MOUTH EVERY DAY 09/13/20  Yes Shade FloodGreene, Reshaun Briseno R, MD  sertraline (ZOLOFT) 50 MG tablet Take 1 tablet (50 mg total) by mouth daily. 06/08/20  Yes Shade FloodGreene, Khaleb Broz R, MD  tamsulosin (FLOMAX) 0.4 MG CAPS capsule Take 1 capsule (0.4 mg total) by mouth daily. 06/08/20  Yes Shade FloodGreene, Dinero Chavira R, MD  tetrahydrozoline 0.05 % ophthalmic solution Place 2 drops into both eyes as needed. Patient not taking:  Reported on 12/07/2020    [provider]   Social History   Socioeconomic History   Marital status: Divorced    Spouse name: Not on file   Number of children: Not on file   Years of education: Not on file   Highest education level: Not on file  Occupational History   Not on file  Tobacco Use   Smoking status: Former    Types: Cigarettes    Quit date: 11/14/2016    Years since quitting: 4.0   Smokeless tobacco: Never  Substance and Sexual Activity   Alcohol use: Yes    Alcohol/week: 3.0 standard drinks    Types: 3 Cans of beer per week   Drug use: No   Sexual activity: Not on file  Other Topics Concern   Not on file  Social History Narrative   Not on file   Social Determinants of Health   Financial Resource Strain: Not on file  Food Insecurity: Not on file  Transportation Needs: Not on file  Physical Activity: Not on file  Stress: Not on file  Social Connections: Not on file  Intimate Partner Violence: Not on file    Review of Systems  Constitutional:  Negative for fatigue and unexpected weight change.  Eyes:  Negative for visual disturbance.  Respiratory:  Negative for cough, chest tightness and shortness of breath.   Cardiovascular:  Negative for chest pain, palpitations and leg swelling.  Gastrointestinal:  Negative for abdominal pain and blood in stool.  Neurological:  Negative for dizziness, light-headedness and headaches.    Objective:   Vitals:   12/07/20 1359  BP: 124/78  Pulse: (!) 52  Resp: 17  Temp: 98.1 F (36.7 C)  TempSrc: Temporal  SpO2: 98%  Weight: 260 lb 3.2 oz (118 kg)  Height: 5\' 11"  (1.803 m)     Physical Exam Vitals reviewed.  Constitutional:      Appearance: He is well-developed.  HENT:     Head: Normocephalic and atraumatic.  Neck:     Vascular: No carotid bruit or JVD.  Cardiovascular:     Rate and Rhythm: Normal rate and regular rhythm.     Heart sounds: Normal heart sounds. No murmur heard. Pulmonary:      Effort: Pulmonary effort is normal.     Breath sounds: Normal breath sounds. No rales.  Abdominal:     General: Abdomen is flat. There is no distension.     Tenderness: There is no abdominal tenderness.  Musculoskeletal:     Right lower leg: No edema.  Left lower leg: No edema.  Skin:    General: Skin is warm and dry.  Neurological:     Mental Status: He is alert and oriented to person, place, and time.  Psychiatric:        Mood and Affect: Mood normal.       Assessment & Plan:  Sirus Labrie is a 65 y.o. male . Hypothyroidism due to Hashimoto's thyroiditis - Plan: Comprehensive metabolic panel, TSH  -Stable previously, check TSH, continue same dose Synthroid for now.  Benign prostatic hyperplasia with weak urinary stream - Plan: tamsulosin (FLOMAX) 0.4 MG CAPS capsule, PSA  -Not completely controlled. some persistent symptoms but intermittent missed doses.  Recommended daily use, and then if persistent lower urinary tract symptoms consider meeting with urology to decide on adjustment of meds versus med change.  We will check PSA.  RTC precautions.  Depression with anxiety - Plan: sertraline (ZOLOFT) 50 MG tablet  -Improved with coping techniques, stress management, would like to remain on same dose sertraline.  Refilled.  Essential hypertension - Plan: Comprehensive metabolic panel, losartan (COZAAR) 100 MG tablet  -  Stable, tolerating current regimen. Medications refilled. Labs pending as above.   Screening for hyperlipidemia - Plan: Comprehensive metabolic panel, Lipid panel   Meds ordered this encounter  Medications   tamsulosin (FLOMAX) 0.4 MG CAPS capsule    Sig: Take 1 capsule (0.4 mg total) by mouth daily.    Dispense:  90 capsule    Refill:  1   sertraline (ZOLOFT) 50 MG tablet    Sig: Take 1 tablet (50 mg total) by mouth daily.    Dispense:  90 tablet    Refill:  1   losartan (COZAAR) 100 MG tablet    Sig: Take 1 tablet (100 mg total) by mouth daily.     Dispense:  90 tablet    Refill:  1   Patient Instructions  No change in meds for now.   Try taking flomax daily, but if symptoms not better at daily dosing, let me know and I can refer to urology.   Thanks for coming in today and let me know if there are questions.   Hypothyroidism  Hypothyroidism is when the thyroid gland does not make enough of certain hormones (it is underactive). The thyroid gland is a small gland located in the lower front part of the neck, just in front of the windpipe (trachea). This gland makes hormones that help control how the body uses food for energy (metabolism) as well as how the heart and brain function. These hormones also play a role in keeping your bones strong. When the thyroid is underactive, it produces too little of the hormones thyroxine (T4) and triiodothyronine (T3). What are the causes? This condition may be caused by: Hashimoto's disease. This is a disease in which the body's disease-fighting system (immune system) attacks the thyroid gland. This is the most common cause. Viral infections. Pregnancy. Certain medicines. Birth defects. Past radiation treatments to the head or neck for cancer. Past treatment with radioactive iodine. Past exposure to radiation in the environment. Past surgical removal of part or all of the thyroid. Problems with a gland in the center of the brain (pituitary gland). Lack of enough iodine in the diet. What increases the risk? You are more likely to develop this condition if: You are male. You have a family history of thyroid conditions. You use a medicine called lithium. You take medicines that affect the immune system (immunosuppressants). What  are the signs or symptoms? Symptoms of this condition include: Feeling as though you have no energy (lethargy). Not being able to tolerate cold. Weight gain that is not explained by a change in diet or exercise habits. Lack of appetite. Dry skin. Coarse  hair. Menstrual irregularity. Slowing of thought processes. Constipation. Sadness or depression. How is this diagnosed? This condition may be diagnosed based on: Your symptoms, your medical history, and a physical exam. Blood tests. You may also have imaging tests, such as an ultrasound or MRI. How is this treated? This condition is treated with medicine that replaces the thyroid hormones that your body does not make. After you begin treatment, it may take several weeksfor symptoms to go away. Follow these instructions at home: Take over-the-counter and prescription medicines only as told by your health care provider. If you start taking any new medicines, tell your health care provider. Keep all follow-up visits as told by your health care provider. This is important. As your condition improves, your dosage of thyroid hormone medicine may change. You will need to have blood tests regularly so that your health care provider can monitor your condition. Contact a health care provider if: Your symptoms do not get better with treatment. You are taking thyroid hormone replacement medicine and you: Sweat a lot. Have tremors. Feel anxious. Lose weight rapidly. Cannot tolerate heat. Have emotional swings. Have diarrhea. Feel weak. Get help right away if you have: Chest pain. An irregular heartbeat. A rapid heartbeat. Difficulty breathing. Summary Hypothyroidism is when the thyroid gland does not make enough of certain hormones (it is underactive). When the thyroid is underactive, it produces too little of the hormones thyroxine (T4) and triiodothyronine (T3). The most common cause is Hashimoto's disease, a disease in which the body's disease-fighting system (immune system) attacks the thyroid gland. The condition can also be caused by viral infections, medicine, pregnancy, or past radiation treatment to the head or neck. Symptoms may include weight gain, dry skin, constipation, feeling  as though you do not have energy, and not being able to tolerate cold. This condition is treated with medicine to replace the thyroid hormones that your body does not make. This information is not intended to replace advice given to you by your health care provider. Make sure you discuss any questions you have with your healthcare provider. Document Revised: 01/01/2020 Document Reviewed: 12/17/2019 Elsevier Patient Education  2022 [employer]    Signed,   Meredith Staggers, MD [facility], [facility] 12/08/20 9:03 AM

## 2020-12-08 LAB — LIPID PANEL
Cholesterol: 159 mg/dL (ref 0–200)
HDL: 44.1 mg/dL (ref >39.00)
LDL Cholesterol: 87 mg/dL (ref 0–99)
NonHDL: 115.29
Total CHOL/HDL Ratio: 4
Triglycerides: 139 mg/dL (ref 0.0–149.0)
VLDL: 27.8 mg/dL (ref 0.0–40.0)

## 2020-12-08 LAB — COMPREHENSIVE METABOLIC PANEL
ALT: 15 U/L (ref 0–53)
AST: 18 U/L (ref 0–37)
Albumin: 4.1 g/dL (ref 3.5–5.2)
Alkaline Phosphatase: 73 U/L (ref 39–117)
BUN: 17 mg/dL (ref 6–23)
CO2: 22 mEq/L (ref 19–32)
Calcium: 9.6 mg/dL (ref 8.4–10.5)
Chloride: 104 mEq/L (ref 96–112)
Creatinine, Ser: 0.99 mg/dL (ref 0.40–1.50)
GFR: 80.43 mL/min (ref >60.00)
Glucose, Bld: 86 mg/dL (ref 70–99)
Potassium: 4.6 mEq/L (ref 3.5–5.1)
Sodium: 136 mEq/L (ref 135–145)
Total Bilirubin: 0.6 mg/dL (ref 0.2–1.2)
Total Protein: 7.4 g/dL (ref 6.0–8.3)

## 2020-12-08 LAB — PSA: PSA: 0.47 ng/mL (ref 0.10–4.00)

## 2020-12-08 LAB — TSH: TSH: 1.81 u[IU]/mL (ref 0.35–5.50)

## 2021-04-14 ENCOUNTER — Other Ambulatory Visit: Payer: Self-pay | Admitting: Family Medicine

## 2021-04-14 DIAGNOSIS — F418 Other specified anxiety disorders: Secondary | ICD-10-CM

## 2021-04-14 DIAGNOSIS — I10 Essential (primary) hypertension: Secondary | ICD-10-CM

## 2021-06-12 ENCOUNTER — Encounter: Payer: Self-pay | Admitting: Family Medicine

## 2021-06-12 ENCOUNTER — Other Ambulatory Visit: Payer: Self-pay

## 2021-06-12 ENCOUNTER — Ambulatory Visit: Payer: Medicare Other | Admitting: Family Medicine

## 2021-06-12 VITALS — BP 122/82 | HR 72 | Temp 98.2°F | Resp 18 | Ht 71.0 in | Wt 259.2 lb

## 2021-06-12 DIAGNOSIS — I10 Essential (primary) hypertension: Secondary | ICD-10-CM | POA: Diagnosis not present

## 2021-06-12 DIAGNOSIS — E038 Other specified hypothyroidism: Secondary | ICD-10-CM

## 2021-06-12 DIAGNOSIS — E669 Obesity, unspecified: Secondary | ICD-10-CM

## 2021-06-12 DIAGNOSIS — N401 Enlarged prostate with lower urinary tract symptoms: Secondary | ICD-10-CM

## 2021-06-12 DIAGNOSIS — E063 Autoimmune thyroiditis: Secondary | ICD-10-CM

## 2021-06-12 DIAGNOSIS — R3912 Poor urinary stream: Secondary | ICD-10-CM

## 2021-06-12 DIAGNOSIS — F418 Other specified anxiety disorders: Secondary | ICD-10-CM

## 2021-06-12 DIAGNOSIS — Z6836 Body mass index (BMI) 36.0-36.9, adult: Secondary | ICD-10-CM

## 2021-06-12 LAB — LIPID PANEL
Cholesterol: 166 mg/dL (ref 0–200)
HDL: 42.6 mg/dL
LDL Cholesterol: 99 mg/dL (ref 0–99)
NonHDL: 123.17
Total CHOL/HDL Ratio: 4
Triglycerides: 121 mg/dL (ref 0.0–149.0)
VLDL: 24.2 mg/dL (ref 0.0–40.0)

## 2021-06-12 LAB — COMPREHENSIVE METABOLIC PANEL WITH GFR
ALT: 12 U/L (ref 0–53)
AST: 16 U/L (ref 0–37)
Albumin: 4.3 g/dL (ref 3.5–5.2)
Alkaline Phosphatase: 79 U/L (ref 39–117)
BUN: 14 mg/dL (ref 6–23)
CO2: 25 meq/L (ref 19–32)
Calcium: 9.4 mg/dL (ref 8.4–10.5)
Chloride: 104 meq/L (ref 96–112)
Creatinine, Ser: 1.08 mg/dL (ref 0.40–1.50)
GFR: 72.2 mL/min
Glucose, Bld: 81 mg/dL (ref 70–99)
Potassium: 4.5 meq/L (ref 3.5–5.1)
Sodium: 136 meq/L (ref 135–145)
Total Bilirubin: 0.6 mg/dL (ref 0.2–1.2)
Total Protein: 7.5 g/dL (ref 6.0–8.3)

## 2021-06-12 MED ORDER — LOSARTAN POTASSIUM 100 MG PO TABS: 100.0000 mg | ORAL_TABLET | Freq: Every day | ORAL | 1 refills | Status: DC

## 2021-06-12 MED ORDER — SERTRALINE HCL 50 MG PO TABS: 50.0000 mg | ORAL_TABLET | Freq: Every day | ORAL | 1 refills | Status: DC

## 2021-06-12 MED ORDER — LEVOTHYROXINE SODIUM 100 MCG PO TABS: 100.0000 ug | ORAL_TABLET | Freq: Every day | ORAL | 3 refills | Status: DC

## 2021-06-12 MED ORDER — TAMSULOSIN HCL 0.4 MG PO CAPS: 0.4000 mg | ORAL_CAPSULE | Freq: Every day | ORAL | 1 refills | Status: DC

## 2021-06-12 NOTE — Progress Notes (Signed)
Subjective:  Patient ID: Fred Davis, male    DOB: 26-Nov-1955  Age: 66 y.o. MRN: [id number]  CC:  Chief Complaint  Patient presents with   Follow-up    Patient states he is here for 6 month follow up on medications and Labs.    HPI Fred Davis presents for   Hypothyroidism: Lab Results  Component Value Date   TSH 1.81 12/07/2020  Taking medication daily.  Synthroid 165mcg No new hot or cold intolerance. No new hair or skin changes, heart palpitations or new fatigue. No new weight changes.   Hyperlipidemia: No current meds.  Trying to keep from gaining weight. Improved A1c (5.7-5.5)and lipids in August. LDL 107- 87  No regular exercise - some walking  No fast food, small soda per day.   Wt Readings from Last 3 Encounters:  06/12/21 259 lb 3.2 oz (117.6 kg)  12/07/20 260 lb 3.2 oz (118 kg)  06/08/20 258 lb 3.2 oz (117.1 kg)    Lab Results  Component Value Date   CHOL 159 12/07/2020   HDL 44.10 12/07/2020   LDLCALC 87 12/07/2020   TRIG 139.0 12/07/2020   CHOLHDL 4 12/07/2020   Lab Results  Component Value Date   ALT 15 12/07/2020   AST 18 12/07/2020   ALKPHOS 73 12/07/2020   BILITOT 0.6 12/07/2020   Depression: Zoloft 50 mg daily.  Discussed options in the past of higher dosing if needed but stress was improving.  Handling of stress was improving. Feels overall well. Wants to remain on same dose.  Depression screen [facility] 2/9 06/12/2021 12/07/2020 06/08/2020 08/26/2019 05/22/2019  Decreased Interest 0 0 0 0 0  Down, Depressed, Hopeless 0 0 0 0 0  PHQ - 2 Score 0 0 0 0 0  Altered sleeping 1 0 - - -  Tired, decreased energy 1 1 - - -  Change in appetite 0 - - - -  Feeling bad or failure about yourself  0 0 - - -  Trouble concentrating 0 0 - - -  Moving slowly or fidgety/restless 0 0 - - -  Suicidal thoughts 0 0 - - -  PHQ-9 Score 2 1 - - -  Difficult doing work/chores Not difficult at all - - - -     Hypertension: Losartan 100 mg daily.  Home readings:  126/72.  BP Readings from Last 3 Encounters:  06/12/21 122/82  12/07/20 124/78  06/08/20 118/80   Lab Results  Component Value Date   CREATININE 0.99 12/07/2020   BPH with LUTS  No retention, no hematuria or dysuria, treats the weak urinary stream with tamsulosin, no new side effects.  Working well.  HM: Screening options with colonoscopy versus Cologuard discussed. Discussed timing of repeat testing intervals if normal, as well as potential need for diagnostic Colonoscopy if positive Cologuard. Understanding expressed, and chose Cologuard.  Last cologuard negative 03/10/18.  Declines flu vaccine, pneumovax at this visit.   History Patient Active Problem List   Diagnosis Date Noted   ICH (intracerebral hemorrhage) ([place]) - right acute ICH at right external capsule 11/14/2016   Left ankle swelling 12/18/2014   Left ankle pain 12/18/2014   Past Medical History:  Diagnosis Date   Hypertension    Thyroid disease    Past Surgical History:  Procedure Laterality Date   HERNIA REPAIR     Allergies  Allergen Reactions   Lisinopril Cough   Prior to Admission medications   Medication Sig Start Date End Date Taking? Authorizing  Provider  levothyroxine (SYNTHROID) 100 MCG tablet Take 1 tablet (100 mcg total) by mouth daily. 06/08/20  Yes [name], MD  losartan (COZAAR) 100 MG tablet TAKE 1 TABLET BY MOUTH EVERY DAY 04/15/21  Yes [name], MD  sertraline (ZOLOFT) 50 MG tablet TAKE 1 TABLET BY MOUTH EVERY DAY 04/15/21  Yes [name], MD  tamsulosin (FLOMAX) 0.4 MG CAPS capsule Take 1 capsule (0.4 mg total) by mouth daily. 12/07/20  Yes [name], MD  tetrahydrozoline 0.05 % ophthalmic solution Place 2 drops into both eyes as needed. Patient not taking: Reported on 12/07/2020    [provider]   Social History   Socioeconomic History   Marital status: Divorced    Spouse name: Not on file   Number of children: Not on file   Years of  education: Not on file   Highest education level: Not on file  Occupational History   Not on file  Tobacco Use   Smoking status: Former    Types: Cigarettes    Quit date: 11/14/2016    Years since quitting: 4.5   Smokeless tobacco: Never  Substance and Sexual Activity   Alcohol use: Yes    Alcohol/week: 3.0 standard drinks    Types: 3 Cans of beer per week   Drug use: No   Sexual activity: Not on file  Other Topics Concern   Not on file  Social History Narrative   Not on file   Social Determinants of Health   Financial Resource Strain: Not on file  Food Insecurity: Not on file  Transportation Needs: Not on file  Physical Activity: Not on file  Stress: Not on file  Social Connections: Not on file  Intimate Partner Violence: Not on file    Review of Systems  Constitutional:  Negative for fatigue and unexpected weight change.  Eyes:  Negative for visual disturbance.  Respiratory:  Negative for cough, chest tightness and shortness of breath.   Cardiovascular:  Negative for chest pain, palpitations and leg swelling.  Gastrointestinal:  Negative for abdominal pain and blood in stool.  Neurological:  Negative for dizziness, light-headedness and headaches.    Objective:   Vitals:   06/12/21 1321  BP: 122/82  Pulse: 72  Resp: 18  Temp: 98.2 F (36.8 C)  TempSrc: Temporal  SpO2: 98%  Weight: 259 lb 3.2 oz (117.6 kg)  Height: 5\' 11"  (1.803 m)     Physical Exam Vitals reviewed.  Constitutional:      Appearance: He is well-developed.  HENT:     Head: Normocephalic and atraumatic.  Neck:     Vascular: No carotid bruit or JVD.     Comments: No thyromegaly/nodules.  Cardiovascular:     Rate and Rhythm: Normal rate and regular rhythm.     Heart sounds: Normal heart sounds. No murmur heard. Pulmonary:     Effort: Pulmonary effort is normal.     Breath sounds: Normal breath sounds. No rales.  Musculoskeletal:     Right lower leg: No edema.     Left lower leg: No  edema.  Skin:    General: Skin is warm and dry.  Neurological:     Mental Status: He is alert and oriented to person, place, and time.  Psychiatric:        Mood and Affect: Mood normal.       Assessment & Plan:  Fred Davis is a 65 y.o. male . Hypothyroidism due to Hashimoto's thyroiditis -  Plan: TSH, levothyroxine (SYNTHROID) 100 MCG tablet  -  Stable, tolerating current regimen. Medications refilled. Labs pending as above.   Depression with anxiety - Plan: sertraline (ZOLOFT) 50 MG tablet  -Discussed options for change in meds, overall feels like current meds are stable.  Declined changes, continue Zoloft 50 mg with RTC precautions  Essential hypertension - Plan: Comprehensive metabolic panel, Lipid panel, losartan (COZAAR) 100 MG tablet  -Tolerating current med regimen, continue same.  Check labs  Benign prostatic hyperplasia with weak urinary stream - Plan: tamsulosin (FLOMAX) 0.4 MG CAPS capsule  -Stable BPH symptoms with use of Flomax.  Continue same.  Potential side effects discussed.  Obesity Low intensity exercise, dietary advice given including cutting back on sugar containing beverages.  Meds ordered this encounter  Medications   losartan (COZAAR) 100 MG tablet    Sig: Take 1 tablet (100 mg total) by mouth daily.    Dispense:  90 tablet    Refill:  1   sertraline (ZOLOFT) 50 MG tablet    Sig: Take 1 tablet (50 mg total) by mouth daily.    Dispense:  90 tablet    Refill:  1   tamsulosin (FLOMAX) 0.4 MG CAPS capsule    Sig: Take 1 capsule (0.4 mg total) by mouth daily.    Dispense:  90 capsule    Refill:  1   levothyroxine (SYNTHROID) 100 MCG tablet    Sig: Take 1 tablet (100 mcg total) by mouth daily.    Dispense:  90 tablet    Refill:  3   Patient Instructions  Be careful with sugar containing beverages, try to increase exercise - walking is a great start, with goal of 145mins per week. Weight should improve. Start low and go slow on intensity.  No med  changes for now.      If you have lab work done today you will be contacted with your lab results within the next 2 weeks.  If you have not heard from [place] then please contact us. The fastest way to get your results is to register for My Chart.   IF you received an x-ray today, you will receive an invoice from [facility]. Please contact [facility] at [phone] with questions or concerns regarding your invoice.   IF you received labwork today, you will receive an invoice from [place]. Please contact [facility] at [phone] with questions or concerns regarding your invoice.   Our billing staff will not be able to assist you with questions regarding bills from these companies.  You will be contacted with the lab results as soon as they are available. The fastest way to get your results is to activate your My Chart account. Instructions are located on the last page of this paperwork. If you have not heard from [place] regarding the results in 2 weeks, please contact this office.        Signed,   Merri Ray, MD [place], [facility] 06/12/21 1:47 PM

## 2021-06-12 NOTE — Patient Instructions (Addendum)
Be careful with sugar containing beverages, try to increase exercise - walking is a great start, with goal of per week. Weight should improve. Start low and go slow on intensity.  No med changes for now.      If you have lab work done today you will be contacted with your lab results within the next 2 weeks.  If you have not heard from [place] then please contact us. The fastest way to get your results is to register for My Chart.   IF you received an x-ray today, you will receive an invoice from [facility]. Please contact [facility] at [phone] with questions or concerns regarding your invoice.   IF you received labwork today, you will receive an invoice from [place]. Please contact [facility] at [phone] with questions or concerns regarding your invoice.   Our billing staff will not be able to assist you with questions regarding bills from these companies.  You will be contacted with the lab results as soon as they are available. The fastest way to get your results is to activate your My Chart account. Instructions are located on the last page of this paperwork. If you have not heard from [place] regarding the results in 2 weeks, please contact this office.

## 2021-06-13 LAB — TSH: TSH: 1.56 u[IU]/mL (ref 0.35–5.50)

## 2021-06-16 ENCOUNTER — Encounter: Payer: Self-pay | Admitting: Family Medicine

## 2021-07-13 LAB — COLOGUARD: COLOGUARD: NEGATIVE

## 2021-12-08 NOTE — Progress Notes (Unsigned)
Subjective:  Patient ID: Fred Davis, male    DOB: 1956/02/02  Age: 66 y.o. MRN: [id number]  CC: No chief complaint Davis file.   HPI Fred Davis presents for   Hypothyroidism: Lab Results  Component Value Date   TSH 1.56 06/12/2021  Due to Hashimoto's thyroiditis. Taking medication daily. Synthoid QD  No new hot or cold intolerance. No new hair or skin changes, heart palpitations or new fatigue. No new weight changes.   Hyperlipidemia with obesity No current meds, plan for diet/exercise approach and dietary counseling given last visit. The 10-year ASCVD risk score (Arnett DK, et al., 2019) is: 13.1%   Values used to calculate the score:     Age: 58 years     Sex: Male     Is Non-Hispanic African American: No     Diabetic: No     Tobacco smoker: No     Systolic Blood Pressure: 122 mmHg     Is BP treated: Yes     HDL Cholesterol: 42.6 mg/dL     Total Cholesterol: 166 mg/dL  There is no height or weight Davis file to calculate BMI. Wt Readings from Last 3 Encounters:  06/12/21 259 lb 3.2 oz (117.6 kg)  12/07/20 260 lb 3.2 oz (118 kg)  06/08/20 258 lb 3.2 oz (117.1 kg)   Lab Results  Component Value Date   HGBA1C 5.5 06/08/2020      Lab Results  Component Value Date   CHOL 166 06/12/2021   HDL 42.60 06/12/2021   LDLCALC 99 06/12/2021   TRIG 121.0 06/12/2021   CHOLHDL 4 06/12/2021   Lab Results  Component Value Date   ALT 12 06/12/2021   AST 16 06/12/2021   ALKPHOS 79 06/12/2021   BILITOT 0.6 06/12/2021   Depression: Treated with sertraline 50 mg daily     06/12/2021    1:24 PM 12/07/2020    2:01 PM 06/08/2020    1:22 PM 08/26/2019    4:20 PM 05/22/2019    2:29 PM  Depression screen PHQ 2/9  Decreased Interest 0 0 0 0 0  Down, Depressed, Hopeless 0 0 0 0 0  PHQ - 2 Score 0 0 0 0 0  Altered sleeping 1 0     Tired, decreased energy 1 1     Change in appetite 0      Feeling bad or failure about yourself  0 0     Trouble concentrating 0 0      Moving slowly or fidgety/restless 0 0     Suicidal thoughts 0 0     PHQ-9 Score 2 1     Difficult doing work/chores Not difficult at all         Hypertension: Continued Davis losartan 100 mg daily. Home readings: BP Readings from Last 3 Encounters:  06/12/21 122/82  12/07/20 124/78  06/08/20 118/80   Lab Results  Component Value Date   CREATININE 1.08 06/12/2021   BPH with weak urinary stream Treated with Flomax 0.4 mg.      History Patient Active Problem List   Diagnosis Date Noted   ICH (intracerebral hemorrhage) ([facility]) - right acute ICH at right external capsule 11/14/2016   Left ankle swelling 12/18/2014   Left ankle pain 12/18/2014   Past Medical History:  Diagnosis Date   Hypertension    Thyroid disease    Past Surgical History:  Procedure Laterality Date   HERNIA REPAIR     Allergies  Allergen Reactions  Lisinopril Cough   Prior to Admission medications   Medication Sig Start Date End Date Taking? Authorizing Provider  levothyroxine (SYNTHROID) 100 MCG tablet Take 1 tablet (100 mcg total) by mouth daily. 06/12/21   Shade Flood, MD  losartan (COZAAR) 100 MG tablet Take 1 tablet (100 mg total) by mouth daily. 06/12/21   Shade Flood, MD  sertraline (ZOLOFT) 50 MG tablet Take 1 tablet (50 mg total) by mouth daily. 06/12/21   Shade Flood, MD  tamsulosin (FLOMAX) 0.4 MG CAPS capsule Take 1 capsule (0.4 mg total) by mouth daily. 06/12/21   Shade Flood, MD   Social History   Socioeconomic History   Marital status: Divorced    Spouse name: Not Davis file   Number of children: Not Davis file   Years of education: Not Davis file   Highest education level: Not Davis file  Occupational History   Not Davis file  Tobacco Use   Smoking status: Former    Types: Cigarettes    Quit date: 11/14/2016    Years since quitting: 5.0   Smokeless tobacco: Never  Substance and Sexual Activity   Alcohol use: Yes    Alcohol/week: 3.0 standard drinks of alcohol     Types: 3 Cans of beer per week   Drug use: No   Sexual activity: Not Davis file  Other Topics Concern   Not Davis file  Social History Narrative   Not Davis file   Social Determinants of Health   Financial Resource Strain: Not Davis file  Food Insecurity: Not Davis file  Transportation Needs: Not Davis file  Physical Activity: Not Davis file  Stress: Not Davis file  Social Connections: Not Davis file  Intimate Partner Violence: Not Davis file    Review of Systems   Objective:  There were no vitals filed for this visit.   Physical Exam     Assessment & Plan:  Fred Davis is a 66 y.o. male . No diagnosis found.   No orders of the defined types were placed in this encounter.  There are no Patient Instructions Davis file for this visit.    Signed,   Meredith Staggers, MD [facility], [facility] 12/08/21 3:42 PM

## 2021-12-11 ENCOUNTER — Encounter: Payer: Self-pay | Admitting: Family Medicine

## 2021-12-11 ENCOUNTER — Ambulatory Visit: Payer: Medicare Other | Admitting: Family Medicine

## 2021-12-11 VITALS — BP 142/82 | HR 76 | Temp 98.1°F | Ht 71.0 in | Wt 259.6 lb

## 2021-12-11 DIAGNOSIS — N401 Enlarged prostate with lower urinary tract symptoms: Secondary | ICD-10-CM

## 2021-12-11 DIAGNOSIS — R3912 Poor urinary stream: Secondary | ICD-10-CM

## 2021-12-11 DIAGNOSIS — Z13220 Encounter for screening for lipoid disorders: Secondary | ICD-10-CM

## 2021-12-11 DIAGNOSIS — I10 Essential (primary) hypertension: Secondary | ICD-10-CM | POA: Diagnosis not present

## 2021-12-11 DIAGNOSIS — E038 Other specified hypothyroidism: Secondary | ICD-10-CM

## 2021-12-11 DIAGNOSIS — E063 Autoimmune thyroiditis: Secondary | ICD-10-CM | POA: Diagnosis not present

## 2021-12-11 DIAGNOSIS — F418 Other specified anxiety disorders: Secondary | ICD-10-CM

## 2021-12-11 DIAGNOSIS — E669 Obesity, unspecified: Secondary | ICD-10-CM

## 2021-12-11 DIAGNOSIS — Z6836 Body mass index (BMI) 36.0-36.9, adult: Secondary | ICD-10-CM

## 2021-12-11 LAB — COMPREHENSIVE METABOLIC PANEL
ALT: 13 U/L (ref 0–53)
AST: 16 U/L (ref 0–37)
Albumin: 4.2 g/dL (ref 3.5–5.2)
Alkaline Phosphatase: 71 U/L (ref 39–117)
BUN: 12 mg/dL (ref 6–23)
CO2: 23 mEq/L (ref 19–32)
Calcium: 9.3 mg/dL (ref 8.4–10.5)
Chloride: 102 mEq/L (ref 96–112)
Creatinine, Ser: 1.11 mg/dL (ref 0.40–1.50)
GFR: 69.62 mL/min (ref >60.00)
Glucose, Bld: 77 mg/dL (ref 70–99)
Potassium: 4.4 mEq/L (ref 3.5–5.1)
Sodium: 136 mEq/L (ref 135–145)
Total Bilirubin: 0.5 mg/dL (ref 0.2–1.2)
Total Protein: 7.7 g/dL (ref 6.0–8.3)

## 2021-12-11 LAB — TSH: TSH: 4.29 u[IU]/mL (ref 0.35–5.50)

## 2021-12-11 MED ORDER — SERTRALINE HCL 50 MG PO TABS: 50.0000 mg | ORAL_TABLET | Freq: Every day | ORAL | 1 refills | Status: DC

## 2021-12-11 MED ORDER — LOSARTAN POTASSIUM 100 MG PO TABS: 100.0000 mg | ORAL_TABLET | Freq: Every day | ORAL | 1 refills | Status: DC

## 2021-12-11 MED ORDER — AMLODIPINE BESYLATE 2.5 MG PO TABS: 2.5000 mg | ORAL_TABLET | Freq: Every day | ORAL | 2 refills | Status: DC

## 2021-12-11 MED ORDER — LEVOTHYROXINE SODIUM 100 MCG PO TABS: 100.0000 ug | ORAL_TABLET | Freq: Every day | ORAL | 3 refills | Status: DC

## 2021-12-11 NOTE — Patient Instructions (Addendum)
Low to moderate intensity exercise most days per week, goal of 150 minutes.  Try to look at lower calorie ice cream if possible.  Blood pressure running a little bit too high.  Add amlodipine once per day, continue losartan same dose.  If new side effects let me know.   Take care!

## 2022-01-03 ENCOUNTER — Other Ambulatory Visit: Payer: Self-pay | Admitting: Family Medicine

## 2022-01-03 DIAGNOSIS — N401 Enlarged prostate with lower urinary tract symptoms: Secondary | ICD-10-CM

## 2022-06-30 ENCOUNTER — Other Ambulatory Visit: Payer: Self-pay | Admitting: Family Medicine

## 2022-06-30 DIAGNOSIS — N401 Enlarged prostate with lower urinary tract symptoms: Secondary | ICD-10-CM

## 2022-09-29 ENCOUNTER — Other Ambulatory Visit: Payer: Self-pay | Admitting: Family Medicine

## 2022-09-29 DIAGNOSIS — I10 Essential (primary) hypertension: Secondary | ICD-10-CM

## 2023-01-02 ENCOUNTER — Other Ambulatory Visit: Payer: Self-pay | Admitting: Family Medicine

## 2023-01-02 DIAGNOSIS — F418 Other specified anxiety disorders: Secondary | ICD-10-CM

## 2023-01-02 DIAGNOSIS — N401 Enlarged prostate with lower urinary tract symptoms: Secondary | ICD-10-CM

## 2023-01-02 DIAGNOSIS — I10 Essential (primary) hypertension: Secondary | ICD-10-CM

## 2023-01-03 ENCOUNTER — Other Ambulatory Visit: Payer: Self-pay | Admitting: Family Medicine

## 2023-01-03 DIAGNOSIS — E038 Other specified hypothyroidism: Secondary | ICD-10-CM

## 2023-01-20 ENCOUNTER — Other Ambulatory Visit: Payer: Self-pay | Admitting: Family Medicine

## 2023-01-20 DIAGNOSIS — I10 Essential (primary) hypertension: Secondary | ICD-10-CM

## 2023-01-30 ENCOUNTER — Ambulatory Visit: Payer: Medicare Other | Admitting: Family Medicine

## 2023-01-30 VITALS — BP 150/84 | HR 64 | Temp 97.9°F | Ht 71.0 in | Wt 252.4 lb

## 2023-01-30 DIAGNOSIS — I10 Essential (primary) hypertension: Secondary | ICD-10-CM

## 2023-01-30 DIAGNOSIS — E785 Hyperlipidemia, unspecified: Secondary | ICD-10-CM | POA: Diagnosis not present

## 2023-01-30 DIAGNOSIS — N401 Enlarged prostate with lower urinary tract symptoms: Secondary | ICD-10-CM

## 2023-01-30 DIAGNOSIS — R3912 Poor urinary stream: Secondary | ICD-10-CM

## 2023-01-30 DIAGNOSIS — F418 Other specified anxiety disorders: Secondary | ICD-10-CM | POA: Diagnosis not present

## 2023-01-30 DIAGNOSIS — E063 Autoimmune thyroiditis: Secondary | ICD-10-CM

## 2023-01-30 MED ORDER — LOSARTAN POTASSIUM 100 MG PO TABS
100.0000 mg | ORAL_TABLET | Freq: Every day | ORAL | 1 refills | Status: DC
Start: 2023-01-30 — End: 2023-06-13

## 2023-01-30 MED ORDER — AMLODIPINE BESYLATE 2.5 MG PO TABS
2.5000 mg | ORAL_TABLET | Freq: Every day | ORAL | 2 refills | Status: DC
Start: 2023-01-30 — End: 2023-06-13

## 2023-01-30 MED ORDER — LEVOTHYROXINE SODIUM 100 MCG PO TABS
100.0000 ug | ORAL_TABLET | Freq: Every day | ORAL | 1 refills | Status: DC
Start: 2023-01-30 — End: 2023-06-13

## 2023-01-30 MED ORDER — TAMSULOSIN HCL 0.4 MG PO CAPS
0.4000 mg | ORAL_CAPSULE | Freq: Every day | ORAL | 1 refills | Status: DC
Start: 2023-01-30 — End: 2023-06-13

## 2023-01-30 MED ORDER — SERTRALINE HCL 50 MG PO TABS
50.0000 mg | ORAL_TABLET | Freq: Every day | ORAL | 1 refills | Status: DC
Start: 2023-01-30 — End: 2023-06-13

## 2023-01-30 NOTE — Patient Instructions (Signed)
Glad you were able to be seen today.  Thank you for coming in.  Lets restart your previous medications at same doses for now, recheck in 6 weeks and we can repeat labs at that time but I will check some basic labs today off your medicines as well.  If blood pressure is not under 140/90 at home in the next week to 10 days at the most, let me know but I suspect that will improve once you are back on meds.  Take care.   Return to the clinic or go to the nearest emergency room if any of your symptoms worsen or new symptoms occur.

## 2023-01-30 NOTE — Progress Notes (Signed)
Subjective:  Patient ID: Fred Davis, male    DOB: [birth year]  Age: 67 y.o. MRN: [id number]  CC:  Chief Complaint  Patient presents with   Medical Management of Chronic Issues    Blood work, med refill    HPI Fred Davis presents for  Chronic medication follow-up, last visit with me in August of last year. Denies any barriers to follow up.   Hypertension: Out of meds  - out of 1 BP med for about a month, other since last Friday -   No side effects with meds.  Prior hx of intracerebral hemorrhage in 2018 - no new speech changes, facial droop or focal weakness.  Home readings: on meds - stable on meds.   BP Readings from Last 3 Encounters:  01/30/23 (!) 150/84  12/11/21 (!) 142/82  06/12/21 122/82   Lab Results  Component Value Date   CREATININE 1.11 12/11/2021   Hypothyroidism: Lab Results  Component Value Date   TSH 4.29 12/11/2021  With Hashimotos' thyroiditis.  Treated with Synthroid 100 mcg daily. Off meds for about 6 weeks.  Taking medication daily.  No new hot or cold intolerance. No new hair or skin changes, heart palpitations or new fatigue. No new weight changes.   Hyperlipidemia: Diet/exercise approach previously.  No recent labs.  Elevated ASCVD score previously,prior intracerebral hemorrhage as above.  Avoiding candy, not buying some other junk food. About the same as last year.  Weight down 7#  Wt Readings from Last 3 Encounters:  01/30/23 252 lb 6 oz (114.5 kg)  12/11/21 259 lb 9.6 oz (117.8 kg)  06/12/21 259 lb 3.2 oz (117.6 kg)    Lab Results  Component Value Date   CHOL 166 06/12/2021   HDL 42.60 06/12/2021   LDLCALC 99 06/12/2021   TRIG 121.0 06/12/2021   CHOLHDL 4 06/12/2021   Lab Results  Component Value Date   ALT 13 12/11/2021   AST 16 12/11/2021   ALKPHOS 71 12/11/2021   BILITOT 0.5 12/11/2021   BPH with weak urinary stream Treated with Flomax 0.4 mg daily previously.  Still taking daily - helps with urination. Minimal  ejaculation - unsure if true retrograde ejaculation, declines urology eval.   Depression/anxiety: Feels like doing well on zoloft 50mg . Only anxious with running out of meds. No new side effects, has not missed doses.       01/30/2023    2:30 PM 12/11/2021    1:18 PM 06/12/2021    1:24 PM 12/07/2020    2:01 PM 06/08/2020    1:22 PM  Depression screen PHQ 2/9  Decreased Interest 0 0 0 0 0  Down, Depressed, Hopeless 0 0 0 0 0  PHQ - 2 Score 0 0 0 0 0  Altered sleeping  1 1 0   Tired, decreased energy  1 1 1    Change in appetite  0 0    Feeling bad or failure about yourself   0 0 0   Trouble concentrating  0 0 0   Moving slowly or fidgety/restless  0 0 0   Suicidal thoughts  0 0 0   PHQ-9 Score  2 2 1    Difficult doing work/chores   Not difficult at all       HM: Declines flu vaccine, pneumonia vaccine, or shingrix vaccine.       History Patient Active Problem List   Diagnosis Date Noted   ICH (intracerebral hemorrhage) (HCC) - right acute ICH at right external capsule  11/14/2016   Left ankle swelling 12/18/2014   Left ankle pain 12/18/2014   Past Medical History:  Diagnosis Date   Hypertension    Thyroid disease    Past Surgical History:  Procedure Laterality Date   HERNIA REPAIR     Allergies  Allergen Reactions   Lisinopril Cough   Prior to Admission medications   Medication Sig Start Date End Date Taking? Authorizing Provider  amLODipine (NORVASC) 2.5 MG tablet Take 1 tablet (2.5 mg total) by mouth daily. 12/11/21  Yes Shade Flood, MD  levothyroxine (SYNTHROID) 100 MCG tablet TAKE 1 TABLET BY MOUTH EVERY DAY 01/03/23  Yes Shade Flood, MD  losartan (COZAAR) 100 MG tablet Take 1 tablet (100 mg total) by mouth daily. 12/11/21  Yes Shade Flood, MD  sertraline (ZOLOFT) 50 MG tablet Take 1 tablet (50 mg total) by mouth daily. 12/11/21  Yes Shade Flood, MD  tamsulosin (FLOMAX) 0.4 MG CAPS capsule TAKE 1 CAPSULE BY MOUTH EVERY DAY 07/02/22  Yes  Shade Flood, MD   Social History   Socioeconomic History   Marital status: Divorced    Spouse name: Not on file   Number of children: Not on file   Years of education: Not on file   Highest education level: 12th grade  Occupational History   Not on file  Tobacco Use   Smoking status: Former    Current packs/day: 0.00    Types: Cigarettes    Quit date: 11/14/2016    Years since quitting: 6.2   Smokeless tobacco: Never  Substance and Sexual Activity   Alcohol use: Yes    Alcohol/week: 3.0 standard drinks of alcohol    Types: 3 Cans of beer per week   Drug use: No   Sexual activity: Not on file  Other Topics Concern   Not on file  Social History Narrative   Not on file   Social Determinants of Health   Financial Resource Strain: Low Risk  (01/29/2023)   Overall Financial Resource Strain (CARDIA)    Difficulty of Paying Living Expenses: Not very hard  Food Insecurity: No Food Insecurity (01/29/2023)   Hunger Vital Sign    Worried About Running Out of Food in the Last Year: Never true    Ran Out of Food in the Last Year: Never true  Transportation Needs: No Transportation Needs (01/29/2023)   PRAPARE - Administrator, Civil Service (Medical): No    Lack of Transportation (Non-Medical): No  Physical Activity: Insufficiently Active (01/29/2023)   Exercise Vital Sign    Days of Exercise per Week: 1 day    Minutes of Exercise per Session: 90 min  Stress: Stress Concern Present (01/29/2023)   [employer] of Occupational Health - Occupational Stress Questionnaire    Feeling of Stress : To some extent  Social Connections: Socially Isolated (01/29/2023)   Social Connection and Isolation Panel [NHANES]    Frequency of Communication with Friends and Family: Twice a week    Frequency of Social Gatherings with Friends and Family: Never    Attends Religious Services: Never    Database administrator or Organizations: No    Attends Hospital doctor: Not on file    Marital Status: Divorced  Intimate Partner Violence: Not on file    Review of Systems  Constitutional:  Negative for fatigue and unexpected weight change.  Eyes:  Negative for visual disturbance.  Respiratory:  Negative for cough, chest tightness and  shortness of breath.   Cardiovascular:  Negative for chest pain, palpitations and leg swelling.  Gastrointestinal:  Negative for abdominal pain and blood in stool.  Neurological:  Negative for dizziness, light-headedness and headaches.     Objective:   Vitals:   01/30/23 1348  BP: (!) 150/84  Pulse: 64  Temp: 97.9 F (36.6 C)  TempSrc: Temporal  SpO2: 97%  Weight: 252 lb 6 oz (114.5 kg)  Height: 5\' 11"  (1.803 m)     Physical Exam Vitals reviewed.  Constitutional:      Appearance: He is well-developed.  HENT:     Head: Normocephalic and atraumatic.  Neck:     Vascular: No carotid bruit or JVD.  Cardiovascular:     Rate and Rhythm: Normal rate and regular rhythm.     Heart sounds: Normal heart sounds. No murmur heard. Pulmonary:     Effort: Pulmonary effort is normal.     Breath sounds: Normal breath sounds. No rales.  Musculoskeletal:     Right lower leg: No edema.     Left lower leg: No edema.  Skin:    General: Skin is warm and dry.  Neurological:     Mental Status: He is alert and oriented to person, place, and time.  Psychiatric:        Mood and Affect: Mood normal.        Assessment & Plan:  Fred Davis is a 67 y.o. male . Hyperlipidemia, unspecified hyperlipidemia type - Plan: Lipid panel, Comp Met (CMET)  -Check labs, then discussed med regimen/options.  Hypothyroidism due to Hashimoto's thyroiditis - Plan: TSH, levothyroxine (SYNTHROID) 100 MCG tablet  -Check TSH, off meds currently anticipate uncontrolled readings, restart Synthroid same doses prior with 6-week follow-up.  Essential hypertension - Plan: Comp Met (CMET), losartan (COZAAR) 100 MG tablet, amLODipine  (NORVASC) 2.5 MG tablet -Asymptomatic but uncontrolled hypertension off meds.  Stressed importance of continued medication adherence and denies any barriers to care as above.  Restart previous med regimen, with 6-week follow-up.  Depression with anxiety - Plan: sertraline (ZOLOFT) 50 MG tablet  -Reports overall stable symptoms, continue sertraline same dose  Benign prostatic hyperplasia with weak urinary stream - Plan: tamsulosin (FLOMAX) 0.4 MG CAPS capsule  -Stable, continue Flomax same dose.  Option to meet with urology if any symptoms of possible retrograde ejaculation but declined at this time.  No orders of the defined types were placed in this encounter.  Patient Instructions  Glad you were able to be seen today.  Thank you for coming in.  Lets restart your previous medications at same doses for now, recheck in 6 weeks and we can repeat labs at that time but I will check some basic labs today off your medicines as well.  If blood pressure is not under 140/90 at home in the next week to 10 days at the most, let me know but I suspect that will improve once you are back on meds.  Take care.   Return to the clinic or go to the nearest emergency room if any of your symptoms worsen or new symptoms occur.     Signed,   Meredith Staggers, MD [facility], [facility] 01/30/23 2:36 PM

## 2023-01-31 LAB — COMPREHENSIVE METABOLIC PANEL
ALT: 21 U/L (ref 0–53)
AST: 29 U/L (ref 0–37)
Albumin: 4.5 g/dL (ref 3.5–5.2)
Alkaline Phosphatase: 73 U/L (ref 39–117)
BUN: 12 mg/dL (ref 6–23)
CO2: 26 meq/L (ref 19–32)
Calcium: 9.9 mg/dL (ref 8.4–10.5)
Chloride: 100 meq/L (ref 96–112)
Creatinine, Ser: 1.04 mg/dL (ref 0.40–1.50)
GFR: 74.68 mL/min (ref >60.00)
Glucose, Bld: 78 mg/dL (ref 70–99)
Potassium: 4.7 meq/L (ref 3.5–5.1)
Sodium: 136 meq/L (ref 135–145)
Total Bilirubin: 0.6 mg/dL (ref 0.2–1.2)
Total Protein: 8.3 g/dL (ref 6.0–8.3)

## 2023-01-31 LAB — TSH: TSH: 33.84 u[IU]/mL — ABNORMAL HIGH (ref 0.35–5.50)

## 2023-01-31 LAB — LIPID PANEL
Cholesterol: 204 mg/dL — ABNORMAL HIGH (ref 0–200)
HDL: 60.4 mg/dL (ref >39.00)
LDL Cholesterol: 115 mg/dL — ABNORMAL HIGH (ref 0–99)
NonHDL: 143.86
Total CHOL/HDL Ratio: 3
Triglycerides: 146 mg/dL (ref 0.0–149.0)
VLDL: 29.2 mg/dL (ref 0.0–40.0)

## 2023-03-13 ENCOUNTER — Encounter: Payer: Self-pay | Admitting: Family Medicine

## 2023-03-13 ENCOUNTER — Ambulatory Visit: Payer: Medicare Other | Admitting: Family Medicine

## 2023-03-13 VITALS — BP 120/76 | HR 66 | Temp 97.4°F | Ht 71.0 in | Wt 250.0 lb

## 2023-03-13 DIAGNOSIS — F418 Other specified anxiety disorders: Secondary | ICD-10-CM

## 2023-03-13 DIAGNOSIS — G4700 Insomnia, unspecified: Secondary | ICD-10-CM

## 2023-03-13 DIAGNOSIS — I10 Essential (primary) hypertension: Secondary | ICD-10-CM

## 2023-03-13 DIAGNOSIS — E063 Autoimmune thyroiditis: Secondary | ICD-10-CM | POA: Diagnosis not present

## 2023-03-13 DIAGNOSIS — E785 Hyperlipidemia, unspecified: Secondary | ICD-10-CM

## 2023-03-13 LAB — BASIC METABOLIC PANEL
BUN: 15 mg/dL (ref 6–23)
CO2: 26 meq/L (ref 19–32)
Calcium: 9.9 mg/dL (ref 8.4–10.5)
Chloride: 103 meq/L (ref 96–112)
Creatinine, Ser: 0.96 mg/dL (ref 0.40–1.50)
GFR: 82.14 mL/min (ref >60.00)
Glucose, Bld: 89 mg/dL (ref 70–99)
Potassium: 4.2 meq/L (ref 3.5–5.1)
Sodium: 136 meq/L (ref 135–145)

## 2023-03-13 LAB — TSH: TSH: 4.03 u[IU]/mL (ref 0.35–5.50)

## 2023-03-13 NOTE — Progress Notes (Unsigned)
Subjective:  Patient ID: Fred Davis, male    DOB: 02/18/1956  Age: 67 y.o. MRN: [id number]  CC:  Chief Complaint  Patient presents with   6 Week Follow-up   Sleep Issues    Can fall asleep easily. But, sometimes he wakes up in the middle of the night and cannot go back to sleep. Thinks it could be the sertraline.     HPI [name] presents for  Follow-up.  Last visit October 16.  Had been out of medications, some delay in previous visit prior to that time.  Hypertension: Uncontrolled last visit off meds, restarted on losartan 100 mg daily, amlodipine 2.5 mg daily. No new side effects back on meds.  Home readings: 126/76.  BP Readings from Last 3 Encounters:  03/13/23 120/76  01/30/23 (!) 150/84  12/11/21 (!) 142/82   Lab Results  Component Value Date   CREATININE 1.04 01/30/2023   Hypothyroidism: Lab Results  Component Value Date   TSH 33.84 (H) 01/30/2023  Uncontrolled off meds last visit, restarted Synthroid. Back on dose - no new sx's.   Depression with anxiety, insomnia As above, sometimes wakes up during the night.  Has been taking sertraline 50 mg daily.  Has taken Flomax 0.4 mg daily.  Nocturia 2 times per night, but wakes up for other reason, trouble getting back to sleep some nights. 2 times per week - wakes up after 3 hours.  Feels really good most of the time.  No recent sleep meds to treat.  New neighbor with loud car. Unknown if snoring, no PND, no known hx of OSA.     03/13/2023    1:58 PM 01/30/2023    2:30 PM 12/11/2021    1:18 PM 06/12/2021    1:24 PM 12/07/2020    2:01 PM  Depression screen PHQ 2/9  Decreased Interest 0 0 0 0 0  Down, Depressed, Hopeless 0 0 0 0 0  PHQ - 2 Score 0 0 0 0 0  Altered sleeping 1  1 1  0  Tired, decreased energy 1  1 1 1   Change in appetite 0  0 0   Feeling bad or failure about yourself  0  0 0 0  Trouble concentrating 0  0 0 0  Moving slowly or fidgety/restless 0  0 0 0  Suicidal thoughts 0  0 0 0   PHQ-9 Score 2  2 2 1   Difficult doing work/chores Not difficult at all   Not difficult at all       12/09/2019    1:34 PM  GAD 7 : Generalized Anxiety Score  Nervous, Anxious, on Edge 0  Control/stop worrying 0  Worry too much - different things 1  Trouble relaxing 0  Restless 0  Easily annoyed or irritable 0  Afraid - awful might happen 1  Total GAD 7 Score 2    Hyperlipidemia: The ASCVD Risk score (Arnett DK, et al., 2019) failed to calculate for the following reasons:   The patient has a prior MI or stroke diagnosis History of previous intracerebral hemorrhage, statins have been recommended, plans on diet/exercise approach. Declines meds.   Lab Results  Component Value Date   CHOL 204 (H) 01/30/2023   HDL 60.40 01/30/2023   LDLCALC 115 (H) 01/30/2023   TRIG 146.0 01/30/2023   CHOLHDL 3 01/30/2023   Lab Results  Component Value Date   ALT 21 01/30/2023   AST 29 01/30/2023   ALKPHOS 73 01/30/2023  BILITOT 0.6 01/30/2023         History Patient Active Problem List   Diagnosis Date Noted   ICH (intracerebral hemorrhage) (HCC) - right acute ICH at right external capsule 11/14/2016   Left ankle swelling 12/18/2014   Left ankle pain 12/18/2014   Past Medical History:  Diagnosis Date   Hypertension    Thyroid disease    Past Surgical History:  Procedure Laterality Date   HERNIA REPAIR     Allergies  Allergen Reactions   Lisinopril Cough   Prior to Admission medications   Medication Sig Start Date End Date Taking? Authorizing Provider  amLODipine (NORVASC) 2.5 MG tablet Take 1 tablet (2.5 mg total) by mouth daily. 01/30/23  Yes Shade Flood, MD  levothyroxine (SYNTHROID) 100 MCG tablet Take 1 tablet (100 mcg total) by mouth daily. 01/30/23  Yes Shade Flood, MD  losartan (COZAAR) 100 MG tablet Take 1 tablet (100 mg total) by mouth daily. 01/30/23  Yes Shade Flood, MD  sertraline (ZOLOFT) 50 MG tablet Take 1 tablet (50 mg total) by mouth  daily. 01/30/23  Yes Shade Flood, MD  tamsulosin (FLOMAX) 0.4 MG CAPS capsule Take 1 capsule (0.4 mg total) by mouth daily. 01/30/23  Yes Shade Flood, MD   Social History   Socioeconomic History   Marital status: Divorced    Spouse name: Not on file   Number of children: Not on file   Years of education: Not on file   Highest education level: 12th grade  Occupational History   Not on file  Tobacco Use   Smoking status: Former    Current packs/day: 0.00    Types: Cigarettes    Quit date: 11/14/2016    Years since quitting: 6.3   Smokeless tobacco: Never  Substance and Sexual Activity   Alcohol use: Yes    Alcohol/week: 3.0 standard drinks of alcohol    Types: 3 Cans of beer per week   Drug use: No   Sexual activity: Not on file  Other Topics Concern   Not on file  Social History Narrative   Not on file   Social Determinants of Health   Financial Resource Strain: Low Risk  (01/29/2023)   Overall Financial Resource Strain (CARDIA)    Difficulty of Paying Living Expenses: Not very hard  Food Insecurity: No Food Insecurity (01/29/2023)   Hunger Vital Sign    Worried About Running Out of Food in the Last Year: Never true    Ran Out of Food in the Last Year: Never true  Transportation Needs: No Transportation Needs (01/29/2023)   PRAPARE - Administrator, Civil Service (Medical): No    Lack of Transportation (Non-Medical): No  Physical Activity: Insufficiently Active (01/29/2023)   Exercise Vital Sign    Days of Exercise per Week: 1 day    Minutes of Exercise per Session: 90 min  Stress: Stress Concern Present (01/29/2023)   [employer] of Occupational Health - Occupational Stress Questionnaire    Feeling of Stress : To some extent  Social Connections: Socially Isolated (01/29/2023)   Social Connection and Isolation Panel [NHANES]    Frequency of Communication with Friends and Family: Twice a week    Frequency of Social Gatherings with Friends  and Family: Never    Attends Religious Services: Never    Database administrator or Organizations: No    Attends Engineer, structural: Not on file    Marital Status: Divorced  Intimate Partner Violence: Not on file    Review of Systems  Constitutional:  Negative for fatigue and unexpected weight change.  Eyes:  Negative for visual disturbance.  Respiratory:  Negative for cough, chest tightness and shortness of breath.   Cardiovascular:  Negative for chest pain, palpitations and leg swelling.  Gastrointestinal:  Negative for abdominal pain and blood in stool.  Neurological:  Negative for dizziness, light-headedness and headaches.     Objective:   Vitals:   03/13/23 1355  BP: 120/76  Pulse: 66  Temp: (!) 97.4 F (36.3 C)  SpO2: 96%  Weight: 250 lb (113.4 kg)  Height: 5\' 11"  (1.803 m)     Physical Exam Vitals reviewed.  Constitutional:      Appearance: He is well-developed.  HENT:     Head: Normocephalic and atraumatic.  Neck:     Vascular: No carotid bruit or JVD.  Cardiovascular:     Rate and Rhythm: Normal rate and regular rhythm.     Heart sounds: Normal heart sounds. No murmur heard. Pulmonary:     Effort: Pulmonary effort is normal.     Breath sounds: Normal breath sounds. No rales.  Musculoskeletal:     Right lower leg: No edema.     Left lower leg: No edema.  Skin:    General: Skin is warm and dry.  Neurological:     Mental Status: He is alert and oriented to person, place, and time.  Psychiatric:        Mood and Affect: Mood normal.        Assessment & Plan:  Landon Noboa is a 67 y.o. male . Essential hypertension - Plan: Basic metabolic panel  Hypothyroidism due to Hashimoto's thyroiditis - Plan: TSH  Depression with anxiety  Insomnia, unspecified type  Hyperlipidemia, unspecified hyperlipidemia type   No orders of the defined types were placed in this encounter.  Patient Instructions  I will check updated labs but blood  pressure looks better on medicine and I suspect her thyroid test will look okay as well.  I would consider a cholesterol medication but if you want to try to work on diet and exercise approach initially and recheck levels in the next few months I think that would be reasonable.  Let me know if you have other thoughts.  See information below on insomnia.  Try to avoid significant fluid intake within 8 hours of bedtime as that could be an issue.  Without difficulty getting to sleep I will hold on new medications for now but if you are not improving with the sleep or intermittent wakening in the next few weeks, please schedule another visit we can discuss that further.  Thank you for coming in today and take care.Insomnia Insomnia is a sleep disorder that makes it difficult to fall asleep or stay asleep. Insomnia can cause fatigue, low energy, difficulty concentrating, mood swings, and poor performance at work or school. There are three different ways to classify insomnia: Difficulty falling asleep. Difficulty staying asleep. Waking up too early in the morning. Any type of insomnia can be long-term (chronic) or short-term (acute). Both are common. Short-term insomnia usually lasts for 3 months or less. Chronic insomnia occurs at least three times a week for longer than 3 months. What are the causes? Insomnia may be caused by another condition, situation, or substance, such as: Having certain mental health conditions, such as anxiety and depression. Using caffeine, alcohol, tobacco, or drugs. Having gastrointestinal conditions, such as gastroesophageal reflux  disease (GERD). Having certain medical conditions. These include: Asthma. Alzheimer's disease. Stroke. Chronic pain. An overactive thyroid gland (hyperthyroidism). Other sleep disorders, such as restless legs syndrome and sleep apnea. Menopause. Sometimes, the cause of insomnia may not be known. What increases the risk? Risk factors for  insomnia include: Gender. Females are affected more often than males. Age. Insomnia is more common as people get older. Stress and certain medical and mental health conditions. Lack of exercise. Having an irregular work schedule. This may include working night shifts and traveling between different time zones. What are the signs or symptoms? If you have insomnia, the main symptom is having trouble falling asleep or having trouble staying asleep. This may lead to other symptoms, such as: Feeling tired or having low energy. Feeling nervous about going to sleep. Not feeling rested in the morning. Having trouble concentrating. Feeling irritable, anxious, or depressed. How is this diagnosed? This condition may be diagnosed based on: Your symptoms and medical history. Your health care provider may ask about: Your sleep habits. Any medical conditions you have. Your mental health. A physical exam. How is this treated? Treatment for insomnia depends on the cause. Treatment may focus on treating an underlying condition that is causing the insomnia. Treatment may also include: Medicines to help you sleep. Counseling or therapy. Lifestyle adjustments to help you sleep better. Follow these instructions at home: Eating and drinking  Limit or avoid alcohol, caffeinated beverages, and products that contain nicotine and tobacco, especially close to bedtime. These can disrupt your sleep. Do not eat a large meal or eat spicy foods right before bedtime. This can lead to digestive discomfort that can make it hard for you to sleep. Sleep habits  Keep a sleep diary to help you and your health care provider figure out what could be causing your insomnia. Write down: When you sleep. When you wake up during the night. How well you sleep and how rested you feel the next day. Any side effects of medicines you are taking. What you eat and drink. Make your bedroom a dark, comfortable place where it is easy  to fall asleep. Put up shades or blackout curtains to block light from outside. Use a white noise machine to block noise. Keep the temperature cool. Limit screen use before bedtime. This includes: Not watching TV. Not using your smartphone, tablet, or computer. Stick to a routine that includes going to bed and waking up at the same times every day and night. This can help you fall asleep faster. Consider making a quiet activity, such as reading, part of your nighttime routine. Try to avoid taking naps during the day so that you sleep better at night. Get out of bed if you are still awake after 15 minutes of trying to sleep. Keep the lights down, but try reading or doing a quiet activity. When you feel sleepy, go back to bed. General instructions Take over-the-counter and prescription medicines only as told by your health care provider. Exercise regularly as told by your health care provider. However, avoid exercising in the hours right before bedtime. Use relaxation techniques to manage stress. Ask your health care provider to suggest some techniques that may work well for you. These may include: Breathing exercises. Routines to release muscle tension. Visualizing peaceful scenes. Make sure that you drive carefully. Do not drive if you feel very sleepy. Keep all follow-up visits. This is important. Contact a health care provider if: You are tired throughout the day. You have trouble  in your daily routine due to sleepiness. You continue to have sleep problems, or your sleep problems get worse. Get help right away if: You have thoughts about hurting yourself or someone else. Get help right away if you feel like you may hurt yourself or others, or have thoughts about taking your own life. Go to your nearest emergency room or: Call 911. Call the National Suicide Prevention Lifeline at (321)156-4652 or 988. This is open 24 hours a day. Text the Crisis Text Line at 432-670-6920. Summary Insomnia  is a sleep disorder that makes it difficult to fall asleep or stay asleep. Insomnia can be long-term (chronic) or short-term (acute). Treatment for insomnia depends on the cause. Treatment may focus on treating an underlying condition that is causing the insomnia. Keep a sleep diary to help you and your health care provider figure out what could be causing your insomnia. This information is not intended to replace advice given to you by your health care provider. Make sure you discuss any questions you have with your health care provider. Document Revised: 03/13/2021 Document Reviewed: 03/13/2021 Elsevier Patient Education  2024 [employer]         Signed,   Meredith Staggers, MD [facility], [facility] 03/13/23 2:13 PM

## 2023-03-13 NOTE — Patient Instructions (Signed)
I will check updated labs but blood pressure looks better on medicine and I suspect her thyroid test will look okay as well.  I would consider a cholesterol medication but if you want to try to work on diet and exercise approach initially and recheck levels in the next few months I think that would be reasonable.  Let me know if you have other thoughts.  See information below on insomnia.  Try to avoid significant fluid intake within 8 hours of bedtime as that could be an issue.  Without difficulty getting to sleep I will hold on new medications for now but if you are not improving with the sleep or intermittent wakening in the next few weeks, please schedule another visit we can discuss that further.  Thank you for coming in today and take care.Insomnia Insomnia is a sleep disorder that makes it difficult to fall asleep or stay asleep. Insomnia can cause fatigue, low energy, difficulty concentrating, mood swings, and poor performance at work or school. There are three different ways to classify insomnia: Difficulty falling asleep. Difficulty staying asleep. Waking up too early in the morning. Any type of insomnia can be long-term (chronic) or short-term (acute). Both are common. Short-term insomnia usually lasts for 3 months or less. Chronic insomnia occurs at least three times a week for longer than 3 months. What are the causes? Insomnia may be caused by another condition, situation, or substance, such as: Having certain mental health conditions, such as anxiety and depression. Using caffeine, alcohol, tobacco, or drugs. Having gastrointestinal conditions, such as gastroesophageal reflux disease (GERD). Having certain medical conditions. These include: Asthma. Alzheimer's disease. Stroke. Chronic pain. An overactive thyroid gland (hyperthyroidism). Other sleep disorders, such as restless legs syndrome and sleep apnea. Menopause. Sometimes, the cause of insomnia may not be known. What  increases the risk? Risk factors for insomnia include: Gender. Females are affected more often than males. Age. Insomnia is more common as people get older. Stress and certain medical and mental health conditions. Lack of exercise. Having an irregular work schedule. This may include working night shifts and traveling between different time zones. What are the signs or symptoms? If you have insomnia, the main symptom is having trouble falling asleep or having trouble staying asleep. This may lead to other symptoms, such as: Feeling tired or having low energy. Feeling nervous about going to sleep. Not feeling rested in the morning. Having trouble concentrating. Feeling irritable, anxious, or depressed. How is this diagnosed? This condition may be diagnosed based on: Your symptoms and medical history. Your health care provider may ask about: Your sleep habits. Any medical conditions you have. Your mental health. A physical exam. How is this treated? Treatment for insomnia depends on the cause. Treatment may focus on treating an underlying condition that is causing the insomnia. Treatment may also include: Medicines to help you sleep. Counseling or therapy. Lifestyle adjustments to help you sleep better. Follow these instructions at home: Eating and drinking  Limit or avoid alcohol, caffeinated beverages, and products that contain nicotine and tobacco, especially close to bedtime. These can disrupt your sleep. Do not eat a large meal or eat spicy foods right before bedtime. This can lead to digestive discomfort that can make it hard for you to sleep. Sleep habits  Keep a sleep diary to help you and your health care provider figure out what could be causing your insomnia. Write down: When you sleep. When you wake up during the night. How well you sleep  and how rested you feel the next day. Any side effects of medicines you are taking. What you eat and drink. Make your bedroom a  dark, comfortable place where it is easy to fall asleep. Put up shades or blackout curtains to block light from outside. Use a white noise machine to block noise. Keep the temperature cool. Limit screen use before bedtime. This includes: Not watching TV. Not using your smartphone, tablet, or computer. Stick to a routine that includes going to bed and waking up at the same times every day and night. This can help you fall asleep faster. Consider making a quiet activity, such as reading, part of your nighttime routine. Try to avoid taking naps during the day so that you sleep better at night. Get out of bed if you are still awake after 15 minutes of trying to sleep. Keep the lights down, but try reading or doing a quiet activity. When you feel sleepy, go back to bed. General instructions Take over-the-counter and prescription medicines only as told by your health care provider. Exercise regularly as told by your health care provider. However, avoid exercising in the hours right before bedtime. Use relaxation techniques to manage stress. Ask your health care provider to suggest some techniques that may work well for you. These may include: Breathing exercises. Routines to release muscle tension. Visualizing peaceful scenes. Make sure that you drive carefully. Do not drive if you feel very sleepy. Keep all follow-up visits. This is important. Contact a health care provider if: You are tired throughout the day. You have trouble in your daily routine due to sleepiness. You continue to have sleep problems, or your sleep problems get worse. Get help right away if: You have thoughts about hurting yourself or someone else. Get help right away if you feel like you may hurt yourself or others, or have thoughts about taking your own life. Go to your nearest emergency room or: Call 911. Call the National Suicide Prevention Lifeline at (810) 811-2991 or 988. This is open 24 hours a day. Text the Crisis  Text Line at 843-229-9451. Summary Insomnia is a sleep disorder that makes it difficult to fall asleep or stay asleep. Insomnia can be long-term (chronic) or short-term (acute). Treatment for insomnia depends on the cause. Treatment may focus on treating an underlying condition that is causing the insomnia. Keep a sleep diary to help you and your health care provider figure out what could be causing your insomnia. This information is not intended to replace advice given to you by your health care provider. Make sure you discuss any questions you have with your health care provider. Document Revised: 03/13/2021 Document Reviewed: 03/13/2021 Elsevier Patient Education  2024 [employer].

## 2023-03-14 ENCOUNTER — Encounter: Payer: Self-pay | Admitting: Family Medicine

## 2023-04-30 ENCOUNTER — Telehealth: Payer: Self-pay

## 2023-04-30 NOTE — Telephone Encounter (Signed)
 Called pharmacy back and provided verbal orders, no contraindications seen in patients chart.

## 2023-04-30 NOTE — Telephone Encounter (Signed)
 Copied from CRM [id number]. Topic: Clinical - Medication Question >> Apr 30, 2023  9:04 AM Isabell A wrote: Reason for CRM: Joesph from [place] calling to confirm if they can switch the manufacturer for levothyroxine  (SYNTHROID ) 100 MCG tablet - same drug, same strength just a different company.  Callback number: [phone]

## 2023-06-13 ENCOUNTER — Encounter: Payer: Self-pay | Admitting: Family Medicine

## 2023-06-13 ENCOUNTER — Ambulatory Visit: Payer: Medicare Other | Admitting: Family Medicine

## 2023-06-13 VITALS — BP 130/86 | HR 74 | Temp 99.3°F | Ht 71.0 in | Wt 252.0 lb

## 2023-06-13 DIAGNOSIS — N401 Enlarged prostate with lower urinary tract symptoms: Secondary | ICD-10-CM | POA: Diagnosis not present

## 2023-06-13 DIAGNOSIS — F418 Other specified anxiety disorders: Secondary | ICD-10-CM

## 2023-06-13 DIAGNOSIS — E063 Autoimmune thyroiditis: Secondary | ICD-10-CM

## 2023-06-13 DIAGNOSIS — I10 Essential (primary) hypertension: Secondary | ICD-10-CM | POA: Diagnosis not present

## 2023-06-13 DIAGNOSIS — E785 Hyperlipidemia, unspecified: Secondary | ICD-10-CM

## 2023-06-13 DIAGNOSIS — G4700 Insomnia, unspecified: Secondary | ICD-10-CM

## 2023-06-13 DIAGNOSIS — R3912 Poor urinary stream: Secondary | ICD-10-CM

## 2023-06-13 MED ORDER — LOSARTAN POTASSIUM 100 MG PO TABS: 100.0000 mg | ORAL_TABLET | Freq: Every day | ORAL | 1 refills | Status: DC

## 2023-06-13 MED ORDER — AMLODIPINE BESYLATE 2.5 MG PO TABS
2.5000 mg | ORAL_TABLET | Freq: Every day | ORAL | 2 refills | Status: DC
Start: 2023-06-13 — End: 2023-12-11

## 2023-06-13 MED ORDER — SERTRALINE HCL 25 MG PO TABS: 25.0000 mg | ORAL_TABLET | Freq: Every day | ORAL | 3 refills | Status: DC

## 2023-06-13 MED ORDER — TAMSULOSIN HCL 0.4 MG PO CAPS: 0.4000 mg | ORAL_CAPSULE | Freq: Every day | ORAL | 1 refills | Status: DC

## 2023-06-13 MED ORDER — LEVOTHYROXINE SODIUM 100 MCG PO TABS: 100.0000 ug | ORAL_TABLET | Freq: Every day | ORAL | 1 refills | Status: DC

## 2023-06-13 NOTE — Patient Instructions (Addendum)
 We can try lower dose of sertraline, but if any worsening anxiety, depression or sleep issues, return to 50mg  dose.  No medication changes at this time.  If any concerns on labs I will let you know.  Follow-up in 6 months but happy to see you sooner if needed.  Take care.

## 2023-06-13 NOTE — Progress Notes (Signed)
 Subjective:  Patient ID: Fred Davis, male    DOB: [birth year]  Age: 68 y.o. MRN: [id number]  CC:  Chief Complaint  Patient presents with   Hypothyroidism    Patient states no concerns.    Hyperlipidemia    Patient states no concerns    Hypertension    Patient states no concerns     HPI Fred Davis presents for   Follow-up of chronic conditions.  Hypertension: Amlodipine 2.5 mg daily, losartan 100 mg daily.  Limit history of intracerebral hemorrhage.  No current side effects with current meds. Statins have been discussed with his previous history and those have been declined in favor of diet and exercise approach initially. Home readings: usually 110-120/60-70.  BP Readings from Last 3 Encounters:  06/13/23 130/86  03/13/23 120/76  01/30/23 (!) 150/84   Lab Results  Component Value Date   CREATININE 0.96 03/13/2023   Lab Results  Component Value Date   CHOL 204 (H) 01/30/2023   HDL 60.40 01/30/2023   LDLCALC 115 (H) 01/30/2023   TRIG 146.0 01/30/2023   CHOLHDL 3 01/30/2023  Has increased whole grains, some diet changes past month.   Hypothyroidism: Lab Results  Component Value Date   TSH 4.03 03/13/2023  Synthroid 100 mcg daily, previously off meds, inconsistent use but now compliant and stable TSH in November.  Taking medication daily.  No new hot or cold intolerance. No new hair or skin changes, heart palpitations or new fatigue. No new weight changes.   Depression with anxiety, insomnia Sertraline 50mg  every day. History of nocturia that has been treated with Flomax, have some other contributors to insomnia and his November visit.  Fluid avoidance before bedtime discussed, sleep hygiene reviewed with handout, no new meds at last visit, continued sertraline same dose. Rare missed dose of tamsulosin - working when taken, nocturia minimal - none past few nights.  Feels like in good mood and may not need as much med.  Sleeping better now. Feels more pleasure in  being alive, more thankful.     03/13/2023    1:58 PM 01/30/2023    2:30 PM 12/11/2021    1:18 PM 06/12/2021    1:24 PM 12/07/2020    2:01 PM  Depression screen PHQ 2/9  Decreased Interest 0 0 0 0 0  Down, Depressed, Hopeless 0 0 0 0 0  PHQ - 2 Score 0 0 0 0 0  Altered sleeping 1  1 1  0  Tired, decreased energy 1  1 1 1   Change in appetite 0  0 0   Feeling bad or failure about yourself  0  0 0 0  Trouble concentrating 0  0 0 0  Moving slowly or fidgety/restless 0  0 0 0  Suicidal thoughts 0  0 0 0  PHQ-9 Score 2  2 2 1   Difficult doing work/chores Not difficult at all   Not difficult at all       12/09/2019    1:34 PM  GAD 7 : Generalized Anxiety Score  Nervous, Anxious, on Edge 0  Control/stop worrying 0  Worry too much - different things 1  Trouble relaxing 0  Restless 0  Easily annoyed or irritable 0  Afraid - awful might happen 1  Total GAD 7 Score 2     Health maintenance, declines vaccines.   History Patient Active Problem List   Diagnosis Date Noted   ICH (intracerebral hemorrhage) (HCC) - right acute ICH at right external capsule 11/14/2016  Left ankle swelling 12/18/2014   Left ankle pain 12/18/2014   Past Medical History:  Diagnosis Date   Hypertension    Thyroid disease    Past Surgical History:  Procedure Laterality Date   HERNIA REPAIR     Allergies  Allergen Reactions   Lisinopril Cough   Prior to Admission medications   Medication Sig Start Date End Date Taking? Authorizing Provider  amLODipine (NORVASC) 2.5 MG tablet Take 1 tablet (2.5 mg total) by mouth daily. 01/30/23  Yes Shade Flood, MD  levothyroxine (SYNTHROID) 100 MCG tablet Take 1 tablet (100 mcg total) by mouth daily. 01/30/23  Yes Shade Flood, MD  losartan (COZAAR) 100 MG tablet Take 1 tablet (100 mg total) by mouth daily. 01/30/23  Yes Shade Flood, MD  sertraline (ZOLOFT) 50 MG tablet Take 1 tablet (50 mg total) by mouth daily. 01/30/23  Yes Shade Flood,  MD  tamsulosin (FLOMAX) 0.4 MG CAPS capsule Take 1 capsule (0.4 mg total) by mouth daily. 01/30/23  Yes Shade Flood, MD   Social History   Socioeconomic History   Marital status: Divorced    Spouse name: Not on file   Number of children: Not on file   Years of education: Not on file   Highest education level: 12th grade  Occupational History   Not on file  Tobacco Use   Smoking status: Former    Current packs/day: 0.00    Types: Cigarettes    Quit date: 11/14/2016    Years since quitting: 6.5   Smokeless tobacco: Never  Substance and Sexual Activity   Alcohol use: Yes    Alcohol/week: 3.0 standard drinks of alcohol    Types: 3 Cans of beer per week   Drug use: No   Sexual activity: Not on file  Other Topics Concern   Not on file  Social History Narrative   Not on file   Social Drivers of Health   Financial Resource Strain: Low Risk  (06/12/2023)   Overall Financial Resource Strain (CARDIA)    Difficulty of Paying Living Expenses: Not hard at all  Food Insecurity: No Food Insecurity (06/12/2023)   Hunger Vital Sign    Worried About Running Out of Food in the Last Year: Never true    Ran Out of Food in the Last Year: Never true  Transportation Needs: No Transportation Needs (06/12/2023)   PRAPARE - Administrator, Civil Service (Medical): No    Lack of Transportation (Non-Medical): No  Physical Activity: Inactive (06/12/2023)   Exercise Vital Sign    Days of Exercise per Week: 0 days    Minutes of Exercise per Session: 90 min  Stress: No Stress Concern Present (06/12/2023)   [employer] of Occupational Health - Occupational Stress Questionnaire    Feeling of Stress : Only a little  Social Connections: Socially Isolated (06/12/2023)   Social Connection and Isolation Panel [NHANES]    Frequency of Communication with Friends and Family: Once a week    Frequency of Social Gatherings with Friends and Family: Once a week    Attends Religious Services:  Never    Database administrator or Organizations: No    Attends Engineer, structural: Not on file    Marital Status: Divorced  Intimate Partner Violence: Not on file    Review of Systems  Constitutional:  Negative for fatigue and unexpected weight change.  Eyes:  Negative for visual disturbance.  Respiratory:  Negative for  cough, chest tightness and shortness of breath.   Cardiovascular:  Negative for chest pain, palpitations and leg swelling.  Gastrointestinal:  Negative for abdominal pain and blood in stool.  Neurological:  Negative for dizziness, light-headedness and headaches.     Objective:   Vitals:   06/13/23 1400  BP: 130/86  Pulse: 74  Temp: 99.3 F (37.4 C)  TempSrc: Temporal  SpO2: 97%  Weight: 252 lb (114.3 kg)  Height: 5\' 11"  (1.803 m)    Physical Exam Vitals reviewed.  Constitutional:      Appearance: He is well-developed.  HENT:     Head: Normocephalic and atraumatic.  Neck:     Vascular: No carotid bruit or JVD.     Comments:  No Neck swelling, mass, ttp.  Cardiovascular:     Rate and Rhythm: Normal rate and regular rhythm.     Heart sounds: Normal heart sounds. No murmur heard. Pulmonary:     Effort: Pulmonary effort is normal.     Breath sounds: Normal breath sounds. No rales.  Musculoskeletal:     Right lower leg: No edema.     Left lower leg: No edema.  Skin:    General: Skin is warm and dry.  Neurological:     Mental Status: He is alert and oriented to person, place, and time.  Psychiatric:        Mood and Affect: Mood normal.     Assessment & Plan:  Fred Davis is a 68 y.o. male . Hyperlipidemia, unspecified hyperlipidemia type - Plan: Lipid panel, Comp Met (CMET)  -With history as above would recommend statin but check labs and adjust plan accordingly.  Continue to watch diet, commended on changes.  Essential hypertension - Plan: amLODipine (NORVASC) 2.5 MG tablet, losartan (COZAAR) 100 MG tablet  -Stable on current  dose, lower readings at home, may have a component of whitecoat hypertension.  Continue to monitor with RTC precautions, check labs and adjust plan accordingly  Hypothyroidism due to Hashimoto's thyroiditis - Plan: levothyroxine (SYNTHROID) 100 MCG tablet, TSH  -Now on consistent levothyroxine dosing.  Last TSH normal, repeat TSH and if stable 33-month follow-up.  Benign prostatic hyperplasia with weak urinary stream - Plan: tamsulosin (FLOMAX) 0.4 MG CAPS capsule  -Stable with tamsulosin, rare missed dose.  No changes.  No new side effects.  Continue same.  Depression with anxiety - Plan: sertraline (ZOLOFT) 25 MG tablet Insomnia, unspecified type  -Anxiety/depression symptoms improved and would like to try a lower dose of medication, will try Zoloft 25 mg for now with option return to 50 mg if any worsening symptoms or worsening insomnia which recently has been well-controlled.  Meds ordered this encounter  Medications   sertraline (ZOLOFT) 25 MG tablet    Sig: Take 1 tablet (25 mg total) by mouth daily.    Dispense:  30 tablet    Refill:  3   amLODipine (NORVASC) 2.5 MG tablet    Sig: Take 1 tablet (2.5 mg total) by mouth daily.    Dispense:  90 tablet    Refill:  2   levothyroxine (SYNTHROID) 100 MCG tablet    Sig: Take 1 tablet (100 mcg total) by mouth daily.    Dispense:  90 tablet    Refill:  1   losartan (COZAAR) 100 MG tablet    Sig: Take 1 tablet (100 mg total) by mouth daily.    Dispense:  90 tablet    Refill:  1   tamsulosin (FLOMAX) 0.4 MG  CAPS capsule    Sig: Take 1 capsule (0.4 mg total) by mouth daily.    Dispense:  90 capsule    Refill:  1   Patient Instructions  We can try lower dose of sertraline, but if any worsening anxiety, depression or sleep issues, return to 50mg  dose.     Signed,   Meredith Staggers, MD [facility], [facility] 06/13/23 2:29 PM

## 2023-06-14 LAB — LIPID PANEL
Cholesterol: 153 mg/dL (ref 0–200)
HDL: 49.5 mg/dL (ref >39.00)
LDL Cholesterol: 85 mg/dL (ref 0–99)
NonHDL: 103.03
Total CHOL/HDL Ratio: 3
Triglycerides: 90 mg/dL (ref 0.0–149.0)
VLDL: 18 mg/dL (ref 0.0–40.0)

## 2023-06-14 LAB — COMPREHENSIVE METABOLIC PANEL
ALT: 20 U/L (ref 0–53)
AST: 25 U/L (ref 0–37)
Albumin: 4.3 g/dL (ref 3.5–5.2)
Alkaline Phosphatase: 72 U/L (ref 39–117)
BUN: 17 mg/dL (ref 6–23)
CO2: 24 meq/L (ref 19–32)
Calcium: 9.5 mg/dL (ref 8.4–10.5)
Chloride: 104 meq/L (ref 96–112)
Creatinine, Ser: 0.97 mg/dL (ref 0.40–1.50)
GFR: 80.98 mL/min (ref >60.00)
Glucose, Bld: 81 mg/dL (ref 70–99)
Potassium: 4.6 meq/L (ref 3.5–5.1)
Sodium: 136 meq/L (ref 135–145)
Total Bilirubin: 0.6 mg/dL (ref 0.2–1.2)
Total Protein: 8 g/dL (ref 6.0–8.3)

## 2023-06-14 LAB — TSH: TSH: 2.52 u[IU]/mL (ref 0.35–5.50)

## 2023-06-20 ENCOUNTER — Encounter: Payer: Self-pay | Admitting: Family Medicine

## 2023-09-22 ENCOUNTER — Other Ambulatory Visit: Payer: Self-pay | Admitting: Family Medicine

## 2023-09-22 DIAGNOSIS — F418 Other specified anxiety disorders: Secondary | ICD-10-CM

## 2023-10-19 ENCOUNTER — Other Ambulatory Visit: Payer: Self-pay | Admitting: Family Medicine

## 2023-10-19 DIAGNOSIS — F418 Other specified anxiety disorders: Secondary | ICD-10-CM

## 2023-11-22 ENCOUNTER — Other Ambulatory Visit: Payer: Self-pay | Admitting: Family Medicine

## 2023-11-22 DIAGNOSIS — F418 Other specified anxiety disorders: Secondary | ICD-10-CM

## 2023-12-11 ENCOUNTER — Encounter: Payer: Self-pay | Admitting: Family Medicine

## 2023-12-11 ENCOUNTER — Ambulatory Visit (INDEPENDENT_AMBULATORY_CARE_PROVIDER_SITE_OTHER): Payer: Federal, State, Local not specified - PPO | Admitting: Family Medicine

## 2023-12-11 DIAGNOSIS — F418 Other specified anxiety disorders: Secondary | ICD-10-CM | POA: Diagnosis not present

## 2023-12-11 DIAGNOSIS — E063 Autoimmune thyroiditis: Secondary | ICD-10-CM | POA: Diagnosis not present

## 2023-12-11 DIAGNOSIS — R3912 Poor urinary stream: Secondary | ICD-10-CM

## 2023-12-11 DIAGNOSIS — N401 Enlarged prostate with lower urinary tract symptoms: Secondary | ICD-10-CM

## 2023-12-11 DIAGNOSIS — I10 Essential (primary) hypertension: Secondary | ICD-10-CM | POA: Diagnosis not present

## 2023-12-11 MED ORDER — AMLODIPINE BESYLATE 2.5 MG PO TABS
2.5000 mg | ORAL_TABLET | Freq: Every day | ORAL | 2 refills | Status: AC
Start: 2023-12-11 — End: ?

## 2023-12-11 MED ORDER — TAMSULOSIN HCL 0.4 MG PO CAPS: 0.4000 mg | ORAL_CAPSULE | Freq: Every day | ORAL | 1 refills | Status: AC

## 2023-12-11 MED ORDER — SERTRALINE HCL 25 MG PO TABS: 25.0000 mg | ORAL_TABLET | Freq: Every day | ORAL | 1 refills | Status: AC

## 2023-12-11 MED ORDER — LOSARTAN POTASSIUM 100 MG PO TABS: 100.0000 mg | ORAL_TABLET | Freq: Every day | ORAL | 1 refills | Status: AC

## 2023-12-11 MED ORDER — LEVOTHYROXINE SODIUM 100 MCG PO TABS: 100.0000 ug | ORAL_TABLET | Freq: Every day | ORAL | 1 refills | Status: AC

## 2023-12-11 NOTE — Progress Notes (Signed)
 Subjective:  Patient ID: Fred Davis, male    DOB: 1955/06/08  Age: 68 y.o. MRN: [id number]  CC:  Chief Complaint  Patient presents with   Follow-up    6 month follow up med check and labs. Wants a 90D supply of zoloft     HPI Fred Davis presents for   Hypertension: Amlodipine  2.5 mg daily, losartan  100 mg daily.  Remote history of intracerebral hemorrhage.  Status have been discussed previously, declined. Home readings: similar to  BP Readings from Last 3 Encounters:  12/11/23 114/70  06/13/23 130/86  03/13/23 120/76   Lab Results  Component Value Date   CREATININE 0.97 06/13/2023   Lab Results  Component Value Date   CHOL 153 06/13/2023   HDL 49.50 06/13/2023   LDLCALC 85 06/13/2023   TRIG 90.0 06/13/2023   CHOLHDL 3 06/13/2023    Hypothyroidism: Lab Results  Component Value Date   TSH 2.52 06/13/2023  Synthroid  100 mcg daily. Taking medication daily.  No new hot or cold intolerance. No new hair or skin changes, heart palpitations or new fatigue. Has been walking more for exercise - 2 miles every other day. Feels like this has helped in may ways. Has lost 10# with walking and yardwork.  Wt Readings from Last 3 Encounters:  12/11/23 242 lb (109.8 kg)  06/13/23 252 lb (114.3 kg)  03/13/23 250 lb (113.4 kg)      Depression with anxiety, insomnia Sertraline  decreased from 50 to 25 mg daily.  Mood doing ok with lower dose med and walking as above.  Nocturia affecting insomnia previously, treated with Flomax .  Fluid avoidance of bedtime.  Improved when discussed in February. Flomax  working well. Happy to sleep 7-8 hrs, but sleeping 6 hrs. Rested for most part. Tamsulosin  working well. No new side effects.     12/11/2023    2:21 PM 03/13/2023    1:58 PM 01/30/2023    2:30 PM 12/11/2021    1:18 PM 06/12/2021    1:24 PM  Depression screen PHQ 2/9  Decreased Interest 0 0 0 0 0  Down, Depressed, Hopeless 0 0 0 0 0  PHQ - 2 Score 0 0 0 0 0  Altered sleeping 1 1   1 1   Tired, decreased energy 1 1  1 1   Change in appetite 0 0  0 0  Feeling bad or failure about yourself  0 0  0 0  Trouble concentrating 0 0  0 0  Moving slowly or fidgety/restless 0 0  0 0  Suicidal thoughts 0 0  0 0  PHQ-9 Score 2 2  2 2   Difficult doing work/chores Not difficult at all Not difficult at all   Not difficult at all    Lab Results  Component Value Date   HGBA1C 5.5 06/08/2020    History Patient Active Problem List   Diagnosis Date Noted   ICH (intracerebral hemorrhage) (HCC) - right acute ICH at right external capsule 11/14/2016   Left ankle swelling 12/18/2014   Left ankle pain 12/18/2014   Past Medical History:  Diagnosis Date   Hypertension    Thyroid  disease    Past Surgical History:  Procedure Laterality Date   HERNIA REPAIR     Allergies  Allergen Reactions   Lisinopril  Cough   Prior to Admission medications   Medication Sig Start Date End Date Taking? Authorizing Provider  amLODipine  (NORVASC ) 2.5 MG tablet Take 1 tablet (2.5 mg total) by mouth daily. 06/13/23  Yes Levora,  Reyes SAUNDERS, MD  levothyroxine  (SYNTHROID ) 100 MCG tablet Take 1 tablet (100 mcg total) by mouth daily. 06/13/23  Yes Levora Reyes SAUNDERS, MD  losartan  (COZAAR ) 100 MG tablet Take 1 tablet (100 mg total) by mouth daily. 06/13/23  Yes Levora Reyes SAUNDERS, MD  sertraline  (ZOLOFT ) 25 MG tablet Take 1 tablet by mouth once daily 11/22/23  Yes Levora Reyes SAUNDERS, MD  tamsulosin  (FLOMAX ) 0.4 MG CAPS capsule Take 1 capsule (0.4 mg total) by mouth daily. 06/13/23  Yes Levora Reyes SAUNDERS, MD   Social History   Socioeconomic History   Marital status: Divorced    Spouse name: Not on file   Number of children: Not on file   Years of education: Not on file   Highest education level: 12th grade  Occupational History   Not on file  Tobacco Use   Smoking status: Former    Current packs/day: 0.00    Types: Cigarettes    Quit date: 11/14/2016    Years since quitting: 7.0   Smokeless tobacco: Never   Substance and Sexual Activity   Alcohol use: Yes    Alcohol/week: 3.0 standard drinks of alcohol    Types: 3 Cans of beer per week   Drug use: No   Sexual activity: Not on file  Other Topics Concern   Not on file  Social History Narrative   Not on file   Social Drivers of Health   Financial Resource Strain: Low Risk  (12/09/2023)   Overall Financial Resource Strain (CARDIA)    Difficulty of Paying Living Expenses: Not hard at all  Food Insecurity: No Food Insecurity (12/09/2023)   Hunger Vital Sign    Worried About Running Out of Food in the Last Year: Never true    Ran Out of Food in the Last Year: Never true  Transportation Needs: No Transportation Needs (12/09/2023)   PRAPARE - Administrator, Civil Service (Medical): No    Lack of Transportation (Non-Medical): No  Physical Activity: Sufficiently Active (12/09/2023)   Exercise Vital Sign    Days of Exercise per Week: 4 days    Minutes of Exercise per Session: 40 min  Stress: No Stress Concern Present (12/09/2023)   [employer] of Occupational Health - Occupational Stress Questionnaire    Feeling of Stress: Only a little  Social Connections: Socially Isolated (12/09/2023)   Social Connection and Isolation Panel    Frequency of Communication with Friends and Family: Twice a week    Frequency of Social Gatherings with Friends and Family: Never    Attends Religious Services: Never    Database administrator or Organizations: No    Attends Engineer, structural: Not on file    Marital Status: Divorced  Intimate Partner Violence: Not on file    Review of Systems  Constitutional:  Negative for fatigue and unexpected weight change.  Eyes:  Negative for visual disturbance.  Respiratory:  Negative for cough, chest tightness and shortness of breath.   Cardiovascular:  Negative for chest pain, palpitations and leg swelling.  Gastrointestinal:  Negative for abdominal pain and blood in stool.  Neurological:   Negative for dizziness, light-headedness and headaches.     Objective:   Vitals:   12/11/23 1339  BP: 114/70  Pulse: 67  Resp: 15  Temp: 98.4 F (36.9 C)  TempSrc: Temporal  SpO2: 96%  Weight: 242 lb (109.8 kg)  Height: 5' 11 (1.803 m)     Physical Exam Vitals reviewed.  Constitutional:      Appearance: He is well-developed.  HENT:     Head: Normocephalic and atraumatic.  Neck:     Vascular: No carotid bruit or JVD.  Cardiovascular:     Rate and Rhythm: Normal rate and regular rhythm.     Heart sounds: Normal heart sounds. No murmur heard. Pulmonary:     Effort: Pulmonary effort is normal.     Breath sounds: Normal breath sounds. No rales.  Musculoskeletal:     Right lower leg: No edema.     Left lower leg: No edema.  Skin:    General: Skin is warm and dry.  Neurological:     Mental Status: He is alert and oriented to person, place, and time.  Psychiatric:        Mood and Affect: Mood normal.        Assessment & Plan:  Clark Cuff is a 68 y.o. male . Depression with anxiety - Plan: sertraline  (ZOLOFT ) 25 MG tablet  - Well-controlled with low-dose Zoloft , likely in part controlled from increased exercise.  Commended on his efforts along with weight loss.  No med changes at this time.  35-month follow-up.  Essential hypertension - Plan: amLODipine  (NORVASC ) 2.5 MG tablet, losartan  (COZAAR ) 100 MG tablet, Comprehensive metabolic panel with GFR, Lipid panel  - Stable control with current regimen, check labs, continue amlodipine , losartan  same dose.  Hypothyroidism due to Hashimoto's thyroiditis - Plan: levothyroxine  (SYNTHROID ) 100 MCG tablet, TSH  - Check TSH, continue same dose Synthroid  at this time.  Adjustment of plan according to lab results.  Benign prostatic hyperplasia with weak urinary stream - Plan: tamsulosin  (FLOMAX ) 0.4 MG CAPS capsule  - Nocturia stable with use of Flomax , continue same.  Meds ordered this encounter  Medications    sertraline  (ZOLOFT ) 25 MG tablet    Sig: Take 1 tablet (25 mg total) by mouth daily.    Dispense:  90 tablet    Refill:  1   amLODipine  (NORVASC ) 2.5 MG tablet    Sig: Take 1 tablet (2.5 mg total) by mouth daily.    Dispense:  90 tablet    Refill:  2   levothyroxine  (SYNTHROID ) 100 MCG tablet    Sig: Take 1 tablet (100 mcg total) by mouth daily.    Dispense:  90 tablet    Refill:  1   losartan  (COZAAR ) 100 MG tablet    Sig: Take 1 tablet (100 mg total) by mouth daily.    Dispense:  90 tablet    Refill:  1   tamsulosin  (FLOMAX ) 0.4 MG CAPS capsule    Sig: Take 1 capsule (0.4 mg total) by mouth daily.    Dispense:  90 capsule    Refill:  1   Patient Instructions  Thanks for coming in today.  Great work on the weight loss and exercise, keep up the good work!  No change in medications at this time. If there are any concerns on your bloodwork, I will let you know. Take care!     Signed,   Reyes Pines, MD [facility], [facility] 12/11/23 4:30 PM

## 2023-12-11 NOTE — Patient Instructions (Signed)
 Thanks for coming in today.  Great work on the weight loss and exercise, keep up the good work!  No change in medications at this time. If there are any concerns on your bloodwork, I will let you know. Take care!

## 2023-12-12 LAB — COMPREHENSIVE METABOLIC PANEL WITH GFR
ALT: 15 U/L (ref 0–53)
AST: 21 U/L (ref 0–37)
Albumin: 4.2 g/dL (ref 3.5–5.2)
Alkaline Phosphatase: 71 U/L (ref 39–117)
BUN: 14 mg/dL (ref 6–23)
CO2: 25 meq/L (ref 19–32)
Calcium: 9.3 mg/dL (ref 8.4–10.5)
Chloride: 102 meq/L (ref 96–112)
Creatinine, Ser: 1.06 mg/dL (ref 0.40–1.50)
GFR: 72.55 mL/min (ref >60.00)
Glucose, Bld: 86 mg/dL (ref 70–99)
Potassium: 4.7 meq/L (ref 3.5–5.1)
Sodium: 136 meq/L (ref 135–145)
Total Bilirubin: 0.5 mg/dL (ref 0.2–1.2)
Total Protein: 7.9 g/dL (ref 6.0–8.3)

## 2023-12-12 LAB — LIPID PANEL
Cholesterol: 147 mg/dL (ref 0–200)
HDL: 53 mg/dL (ref >39.00)
LDL Cholesterol: 77 mg/dL (ref 0–99)
NonHDL: 94.08
Total CHOL/HDL Ratio: 3
Triglycerides: 87 mg/dL (ref 0.0–149.0)
VLDL: 17.4 mg/dL (ref 0.0–40.0)

## 2023-12-12 LAB — TSH: TSH: 1.29 u[IU]/mL (ref 0.35–5.50)

## 2023-12-13 ENCOUNTER — Ambulatory Visit: Payer: Self-pay | Admitting: Family Medicine

## 2024-06-10 ENCOUNTER — Ambulatory Visit: Admitting: Family Medicine
